# Patient Record
Sex: Male | Born: 1945 | Race: White | Hispanic: No | Smoking: Current every day smoker
Health system: Southern US, Community
[De-identification: ages and names within clinical notes are randomized; demographics above are authoritative.]

## PROBLEM LIST (undated history)

## (undated) DIAGNOSIS — F329 Major depressive disorder, single episode, unspecified: Secondary | ICD-10-CM

## (undated) DIAGNOSIS — E785 Hyperlipidemia, unspecified: Secondary | ICD-10-CM

## (undated) DIAGNOSIS — C801 Malignant (primary) neoplasm, unspecified: Secondary | ICD-10-CM

## (undated) DIAGNOSIS — Z789 Other specified health status: Secondary | ICD-10-CM

## (undated) DIAGNOSIS — I10 Essential (primary) hypertension: Secondary | ICD-10-CM

## (undated) DIAGNOSIS — F32A Depression, unspecified: Secondary | ICD-10-CM

## (undated) DIAGNOSIS — M1990 Unspecified osteoarthritis, unspecified site: Secondary | ICD-10-CM

## (undated) DIAGNOSIS — F419 Anxiety disorder, unspecified: Secondary | ICD-10-CM

## (undated) HISTORY — DX: Anxiety disorder, unspecified: F41.9

## (undated) HISTORY — DX: Hyperlipidemia, unspecified: E78.5

## (undated) HISTORY — PX: PROSTATE SURGERY: SHX751

## (undated) HISTORY — DX: Major depressive disorder, single episode, unspecified: F32.9

## (undated) HISTORY — DX: Depression, unspecified: F32.A

## (undated) HISTORY — DX: Essential (primary) hypertension: I10

## (undated) HISTORY — DX: Malignant (primary) neoplasm, unspecified: C80.1

## (undated) HISTORY — PX: FRACTURE SURGERY: SHX138

## (undated) NOTE — *Deleted (*Deleted)
PROGRESS NOTE    Lee Wang  LHT:[id number] DOB: March 16, 1945 DOA: 12/13/2019 PCP: Patient, No Pcp Per     Brief Narrative:  21 year old WM PMHx Etoh abuse, BPH, prostate cancer , tobacco abuse, HLD and HTN   who had a witnessed mechanical fall after missing a step on 11/19/2019 subsequently imaging studies demonstrated fracture dislocation of the ankle and acute fracture (avulsion) through the base of the fifth metatarsal, he underwent Closed reduction and splinting of bimalleolar right ankle fractureon 11/20/19, on 11/30/2019 patient underwent-Removal of pre-existing plate and screws from the fibula with revision ORIF of the fibula with a locking plate. ORIF of the vertical shear fracture of the medial malleolus -On 12/14/2019 patient was found to have failure of hardware with essentially conversion of his close fractures and open fracture, on 12/14/19 patient underwent Removal of hardware from right ankle,irrigation and debridement,unsuccessful attempt at placement of external fixation and splint application Transferred to [facility] from [facility] for definitive care .  Currently plan is treat hyponatremia.  Right tibiocalcaneal fusion tomorrow.   Subjective: 11/15 afebrile overnight A/O x2 (does not know when, why) afebrile overnight.  Cleared by surgery to be discharged to SNF   Assessment & Plan: Covid vaccination;   Principal Problem:   Closed right ankle fracture, sequela Active Problems:   Alcohol abuse   HTN (hypertension)   Depression with anxiety   Prostate CA (HCC)   BPH (benign prostatic hyperplasia)   Hyponatremia   Alcohol dependence ([place])   Closed right ankle fracture   Tobacco abuse   Rupture of operation wound   Postprocedural hypotension   Shock ([place])   Acute metabolic encephalopathy/ Hypoosmolar Hyponatremia.   -Multifactorial EtOH abuse, poor p.o. intake. -Monitor closely for further mental status changes  -Monitor closely  sodium level -Hold all medication which will lower sodium level. -Lexapro (hold) -NaCl tablets 2 g TID Lab Results  Component Value Date   NA 132 (L) 12/24/2019   NA 131 (L) 12/23/2019   NA 132 (L) 12/22/2019   NA 129 (L) 12/21/2019   NA 126 (L) 12/20/2019  -11/15 KVO normal saline  EtOH abuse -Folic acid 1 mg daily -Thiamine 100 mg daily -Seizure precaution  RIGHT ankle fracture with failed hardware. Presented with an open wound with failed hardware on 12/14/2019. Underwent removal of the hardware, irrigation and debridement as well as unsuccessful attempt at placement of external fixation on 12/14/2019. Patient was on IV antibiotics. Patient is currently transferred to [facility] for ankle fusion by Dr. Sharol Given tentatively scheduled for Wednesday, 12/20/2019 -.11/10  RIGHT TIBIOCALCANEAL FUSION -11/11 per orthopedic surgery STRICT nonweightbearing RIGHT lower extremity  Hypotension/Essential HTN -HTN currently not a factor -Per PACU staff patient receiving Phenyl ephedrine during surgery and unable to titrate patient off without dropping BP. -11/10 Albumin 12.5 g + normal saline -Continue Phenyl ephedrine, continue to attempt to wean patient off in the ICU.  Currently at 80 mcg/min -Map goal> 65 -11/11 Phenylephedrine 70 mcg/min -11/11 albumin 50 g + normal saline 67ml/hr -11/13 BP improved with fluids and midodrine -11/14 patient now hypertensive DC Midodrine  Acute blood loss anemia/acute on chronic iron deficiency anemia. -Patient has had multiple surgeries in the past 30 days secondary to fractured ankle  -11/11 transfuse 2 unit PRBC -11/11 ADDENDUM; possible blood transfusion reaction paged by [name] who states 1 hour post second unit of PRBC patient has red welts on his legs buttocks arms that are pruritic.  Negative airway compromise. -  Benadryl IV 12.5 mg x 1.  For possible blood transfusion reaction -11/12 transfuse 1 unit PRBC; pretreat Benadryl IV 12.5  mg x 1 Lab Results  Component Value Date   HGB 9.4 (L) 12/25/2019   HGB 9.0 (L) 12/24/2019   HGB 8.3 (L) 12/23/2019   HGB 8.2 (L) 12/22/2019   HGB 7.1 (L) 12/22/2019  -Transfuse for hemoglobin<7  Blood transfusion reaction? -On 11/11 post 2 units PRBC patient developed welts that are pruritic responded to IV Benadryl. -In the future pretreat patient with Benadryl prior to transfusion  Tobacco abuse. -Holding off on nicotine patch due to confusion.  Monitor.  Depression and anxiety. -No suicidal ideation. -No significant anxiety as well. -Lexapro 10 mg daily (hold).  Secondary to hyponatremia  Incidentally RSV positive. -Continue droplet precaution.  No respiratory symptoms for now.  Hypokalemia -Potassium goal> 4 -11/15 K-Dur 50 mEq  Hypophosphatemia -Phosphorus goal> 2.5   Hypomagnesmia -Magnesium goal> 2 -11/15 magnesium IV 2 g  ADDENDUM; 11/15 called by RN Rayfield Citizen who noticed elevated ST waves on monitor.  Patient asymptomatic however will obtain twelve-lead EKG, troponin, ensure all electrolytes at goal.   Goals of care -11/15 have requested to LCSW begin SNF search as patient has been cleared by orthopedic surgery     DVT prophylaxis: 11/13 anemia stable.  Start Heparin subcu   Code Status: Full Family Communication:  Status is: Inpatient    Dispo: The patient is from: Home              Anticipated d/c is to: SNF              Anticipated d/c date is:??              Patient currently Stable      Consultants:  Orthopedic surgery Dr. Lajoyce Corners   Procedures/Significant Events:  11/10  RIGHT TIBIOCALCANEAL FUSION    I have personally reviewed and interpreted all radiology studies and my findings are as above.  VENTILATOR SETTINGS: Room air 11/15 SPO2 100%    Cultures 11/3 SARS coronavirus negative 11/3 influenza A/B negative 11/3 positive RSV   Antimicrobials: Anti-infectives (From admission, onward)   Start     Ordered Stop    12/20/19 0815  ceFAZolin (ANCEF) IVPB 2g/100 mL premix        12/20/19 0722 12/20/19 1255   12/15/19 0900  cefTRIAXone (ROCEPHIN) 1 g in sodium chloride 0.9 % 100 mL IVPB        12/15/19 0803 12/18/19 0736   12/14/19 1400  ceFAZolin (ANCEF) IVPB 2g/100 mL premix        12/14/19 1158 12/15/19 2049   12/14/19 0600  ceFAZolin (ANCEF) IVPB 2g/100 mL premix        12/13/19 1316 12/14/19 0807   12/13/19 1415  ceFAZolin (ANCEF) IVPB 2g/100 mL premix        12/13/19 1315 12/14/19 1610       Devices    LINES / TUBES:      Continuous Infusions: . sodium chloride Stopped (12/23/19 0248)  . sodium chloride Stopped (12/21/19 1844)     Objective: Vitals:   12/24/19 2339 12/24/19 2340 12/25/19 0321 12/25/19 0743  BP:  135/77 (!) 151/75 (!) 146/69  Pulse:    65  Resp:  11 16 14   Temp: 97.6 F (36.4 C)  97.6 F (36.4 C) 97.6 F (36.4 C)  TempSrc: Oral  Oral Oral  SpO2:  100% 100% 99%  Weight:      Height:  Intake/Output Summary (Last 24 hours) at 12/25/2019 0809 Last data filed at 12/25/2019 0743 Gross per 24 hour  Intake 0 ml  Output 1760 ml  Net -1760 ml   Filed Weights   12/13/19 1302 12/17/19 1248 12/20/19 1106  Weight: 56.2 kg 64.3 kg 64.3 kg   Physical Exam:  General: A/O x2 (does not know when, why), sitting in chair comfortably, No acute respiratory distress Eyes: negative scleral hemorrhage, negative anisocoria, negative icterus ENT: Negative Runny nose, negative gingival bleeding, Neck:  Negative scars, masses, torticollis, lymphadenopathy, JVD Lungs: Clear to auscultation bilaterally without wheezes or crackles Cardiovascular: Sinus tachycardia without murmur gallop or rub normal S1 and S2 Abdomen: negative abdominal pain, nondistended, positive soft, bowel sounds, no rebound, no ascites, no appreciable mass Extremities: lower extremity with wound VAC in place, appears not to be draining any new fluid.  Fluid in reservoir appears to be old. Skin:  Negative rashes, lesions, ulcers Psychiatric:  Negative depression, negative anxiety, negative fatigue, negative mania  Central nervous system:  Cranial nerves II through XII intact, tongue/uvula midline, all extremities muscle strength 5/5, sensation intact throughout, negative dysarthria, negative expressive aphasia, negative receptive aphasia.  .     Data Reviewed: Care during the described time interval was provided by me .  I have reviewed this patient's available data, including medical history, events of note, physical examination, and all test results as part of my evaluation.  CBC: Recent Labs  Lab 12/20/19 0855 12/20/19 0855 12/21/19 0251 12/21/19 0251 12/21/19 1411 12/22/19 0309 12/22/19 1457 12/23/19 0625 12/24/19 0229  WBC 6.1   < > 24.6*  --  12.0* 10.5  --  6.8 6.6  NEUTROABS 4.0  --  21.5*  --   --  7.8*  --  5.3 4.6  HGB 8.2*   < > 6.6*   < > 7.7* 7.1* 8.2* 8.3* 9.0*  HCT 24.1*   < > 19.3*   < > 22.6* 21.1* 24.0* 24.6* 26.6*  MCV 96.0   < > 99.0  --  93.8 94.6  --  93.2 93.7  PLT 302   < > 351  --  266 220  --  230 243   < > = values in this interval not displayed.   Basic Metabolic Panel: Recent Labs  Lab 12/20/19 0855 12/21/19 0251 12/22/19 0309 12/23/19 0625 12/24/19 0229  NA 126* 129* 132* 131* 132*  K 4.1 5.1 3.7 3.5 3.5  CL 96* 99 105 102 99  CO2 25 19* 19* 22 27  GLUCOSE 88 94 95 92 98  BUN <5* 12 12 6* <5*  CREATININE 0.52* 0.94 0.68 0.59* 0.52*  CALCIUM 8.7* 8.4* 8.2* 8.2* 8.3*  MG 1.7 1.6* 1.9 1.6* 1.7  PHOS 3.4 4.9* 2.4* 2.4* 3.0   GFR: Estimated Creatinine Clearance: 73.7 mL/min (A) (by C-G formula based on SCr of 0.52 mg/dL (L)). Liver Function Tests: Recent Labs  Lab 12/20/19 0855 12/21/19 0251 12/22/19 0309 12/23/19 0625 12/24/19 0229  AST 31 47* 38 33 45*  ALT 20 27 23 20 24   ALKPHOS 132* 99 72 84 98  BILITOT 1.1 1.0 1.5* 1.2 1.3*  PROT 5.4* 4.7* 4.3* 4.3* 4.5*  ALBUMIN 2.5* 2.7* 2.7* 2.5* 2.6*   No results for  input(s): LIPASE, AMYLASE in the last 168 hours. No results for input(s): AMMONIA in the last 168 hours. Coagulation Profile: No results for input(s): INR, PROTIME in the last 168 hours. Cardiac Enzymes: No results for input(s): CKTOTAL, CKMB, CKMBINDEX, TROPONINI  in the last 168 hours. BNP (last 3 results) No results for input(s): PROBNP in the last 8760 hours. HbA1C: No results for input(s): HGBA1C in the last 72 hours. CBG: No results for input(s): GLUCAP in the last 168 hours. Lipid Profile: No results for input(s): CHOL, HDL, LDLCALC, TRIG, CHOLHDL, LDLDIRECT in the last 72 hours. Thyroid Function Tests: No results for input(s): TSH, T4TOTAL, FREET4, T3FREE, THYROIDAB in the last 72 hours. Anemia Panel: No results for input(s): VITAMINB12, FOLATE, FERRITIN, TIBC, IRON, RETICCTPCT in the last 72 hours. Sepsis Labs: Recent Labs  Lab 12/20/19 1820 12/21/19 0251  PROCALCITON <0.10 0.21    Recent Results (from the past 240 hour(s))  MRSA PCR Screening     Status: None   Collection Time: 12/20/19  8:13 PM   Specimen: Nasal Mucosa; Nasopharyngeal  Result Value Ref Range Status   MRSA by PCR NEGATIVE NEGATIVE Final    Comment:        The GeneXpert MRSA Assay (FDA approved for NASAL specimens only), is one component of a comprehensive MRSA colonization surveillance program. It is not intended to diagnose MRSA infection nor to guide or monitor treatment for MRSA infections. Performed at [facility], [address] [place], [place] [address]   Culture, blood (routine x 2)     Status: None (Preliminary result)   Collection Time: 12/20/19  9:23 PM   Specimen: BLOOD RIGHT HAND  Result Value Ref Range Status   Specimen Description BLOOD RIGHT HAND  Final   Special Requests   Final    BOTTLES DRAWN AEROBIC ONLY Blood Culture adequate volume   Culture   Final    NO GROWTH 4 DAYS Performed at [facility], [address] [place], [place] [address]    Report  Status PENDING  Incomplete  Culture, blood (routine x 2)     Status: None (Preliminary result)   Collection Time: 12/20/19  9:33 PM   Specimen: BLOOD LEFT HAND  Result Value Ref Range Status   Specimen Description BLOOD LEFT HAND  Final   Special Requests   Final    BOTTLES DRAWN AEROBIC ONLY Blood Culture results may not be optimal due to an inadequate volume of blood received in culture bottles   Culture   Final    NO GROWTH 4 DAYS Performed at [facility], [address] [place], [place] [address]    Report Status PENDING  Incomplete         Radiology Studies: No results found.      Scheduled Meds: . sodium chloride   Intravenous Once  . sodium chloride   Intravenous Once  . sodium chloride   Intravenous Once  . aspirin EC  81 mg Oral BID  . atorvastatin  40 mg Oral Daily  . Chlorhexidine Gluconate Cloth  6 each Topical Daily  . docusate sodium  100 mg Oral BID  . feeding supplement  237 mL Oral BID BM  . ferrous sulfate  325 mg Oral BID WC  . folic acid  1 mg Oral Daily  . heparin injection (subcutaneous)  5,000 Units Subcutaneous Q8H  . multivitamin with minerals  1 tablet Oral Daily  . sodium chloride  2 g Oral TID  . thiamine  100 mg Oral Daily   Or  . thiamine  100 mg Intravenous Daily  . vitamin B-12  1,000 mcg Oral Daily   Continuous Infusions: . sodium chloride Stopped (12/23/19 0248)  . sodium chloride  Stopped (12/21/19 1844)     LOS: 12 days    Time spent:40 min    WOODS, Roselind Messier, MD [facility] Pager [phone]  If 7PM-7AM, please contact night-coverage [email or web address] Password [email or web address] 12/25/2019, 8:09 AM

---

## 2003-01-18 ENCOUNTER — Other Ambulatory Visit: Admission: RE | Admit: 2003-01-18 | Discharge: 2003-01-18 | Payer: Self-pay | Admitting: Dermatology

## 2003-07-20 ENCOUNTER — Inpatient Hospital Stay (HOSPITAL_COMMUNITY): Admission: RE | Admit: 2003-07-20 | Discharge: 2003-07-26 | Payer: Self-pay | Admitting: Psychiatry

## 2003-07-21 ENCOUNTER — Ambulatory Visit (HOSPITAL_COMMUNITY): Admission: RE | Admit: 2003-07-21 | Discharge: 2003-07-21 | Payer: Self-pay | Admitting: Psychiatry

## 2009-02-09 HISTORY — PX: COLONOSCOPY: SHX174

## 2009-07-18 ENCOUNTER — Ambulatory Visit: Payer: Self-pay | Admitting: Internal Medicine

## 2009-07-18 ENCOUNTER — Encounter: Payer: Self-pay | Admitting: Gastroenterology

## 2009-07-18 DIAGNOSIS — R197 Diarrhea, unspecified: Secondary | ICD-10-CM | POA: Insufficient documentation

## 2009-07-18 DIAGNOSIS — F1010 Alcohol abuse, uncomplicated: Secondary | ICD-10-CM

## 2009-07-18 DIAGNOSIS — R109 Unspecified abdominal pain: Secondary | ICD-10-CM | POA: Insufficient documentation

## 2009-07-18 DIAGNOSIS — K921 Melena: Secondary | ICD-10-CM | POA: Insufficient documentation

## 2009-07-19 ENCOUNTER — Ambulatory Visit (HOSPITAL_COMMUNITY): Admission: RE | Admit: 2009-07-19 | Discharge: 2009-07-19 | Payer: Self-pay | Admitting: Internal Medicine

## 2009-07-22 LAB — CONVERTED CEMR LAB
AST: 21 units/L (ref 0–37)
Alkaline Phosphatase: 85 units/L (ref 39–117)
Basophils Absolute: 0.1 10*3/uL (ref 0.0–0.1)
CO2: 25 meq/L (ref 19–32)
Chloride: 101 meq/L (ref 96–112)
Creatinine, Ser: 1.06 mg/dL (ref 0.40–1.50)
Eosinophils Absolute: 0.6 10*3/uL (ref 0.0–0.7)
Eosinophils Relative: 6 % — ABNORMAL HIGH (ref 0–5)
Glucose, Bld: 91 mg/dL (ref 70–99)
HCT: 51.6 % (ref 39.0–52.0)
Hemoglobin: 17.5 g/dL — ABNORMAL HIGH (ref 13.0–17.0)
Indirect Bilirubin: 0.1 mg/dL (ref 0.0–0.9)
Lipase: 52 units/L (ref 0–75)
Monocytes Absolute: 0.9 10*3/uL (ref 0.1–1.0)
Neutrophils Relative %: 64 % (ref 43–77)
Prothrombin Time: 13.2 s (ref 11.6–15.2)
RBC: 5.25 M/uL (ref 4.22–5.81)
RDW: 14.2 % (ref 11.5–15.5)
Sodium: 138 meq/L (ref 135–145)
WBC: 10.1 10*3/uL (ref 4.0–10.5)

## 2009-08-13 ENCOUNTER — Telehealth (INDEPENDENT_AMBULATORY_CARE_PROVIDER_SITE_OTHER): Payer: Self-pay

## 2009-08-15 ENCOUNTER — Ambulatory Visit (HOSPITAL_COMMUNITY): Admission: RE | Admit: 2009-08-15 | Discharge: 2009-08-15 | Payer: Self-pay | Admitting: Internal Medicine

## 2009-08-15 ENCOUNTER — Ambulatory Visit: Payer: Self-pay | Admitting: Internal Medicine

## 2009-08-15 LAB — HM COLONOSCOPY

## 2009-08-18 ENCOUNTER — Encounter: Payer: Self-pay | Admitting: Internal Medicine

## 2009-08-22 ENCOUNTER — Encounter: Payer: Self-pay | Admitting: Internal Medicine

## 2010-03-11 NOTE — Letter (Signed)
Summary: Patient Notice, Colon Biopsy Results  [facility]  [address]   [place], [place] [address]   Phone: [phone]  Fax: [phone]       August 22, 2009   Lee Wang [address] [place], [place]  [address] [birth year]    Dear Lee Wang,  I am pleased to inform you that the biopsies taken during your recent colonoscopy did not show any evidence of cancer upon pathologic examination.  Additional information/recommendations:  You should have a repeat colonoscopy examination  in 3 years.  Please call us if you are having persistent problems or have questions about your condition that have not been fully answered at this time.  Sincerely,    R. Roetta Sessions MD, FACP [facility] Ph: [phone]    Fax: [phone]   Appended Document: Patient Notice, Colon Biopsy Results Notes faxed and reminder appt made-  Appended Document: Patient Notice, Colon Biopsy Results Letter mailed to pt.

## 2010-03-11 NOTE — Letter (Signed)
Summary: ABD U/S ORDER  ABD U/S ORDER   Imported By: Ave Filter 07/18/2009 13:24:48  _____________________________________________________________________  External Attachment:    Type:   Image     Comment:   External Document

## 2010-03-11 NOTE — Assessment & Plan Note (Signed)
Summary: rectal bleeding/ss   Visit Type:  Initial Visit Primary Care Provider:  [facility]  Chief Complaint:  rectal bleeding.  History of Present Illness: Lee Wang is a pleasant 65 y/o WM, who presents today as self-referral for further evaluation of rectal bleeding and lower abdominal pain. He has had problems for a "long-time". He c/o having 2-3 loose stools per day. He c/o anorectal irritation, takes Prep-H with relief. He c/o lower abd pain every day. Never goes away. Seems to be worse with alcohol consumption. Switched to Bud Light and seems to be better. He consumes 8-12 twelve ounce beers per day. He has drank regularly for past ten years, "since my wife left". "It helps my nerves.". He also takes either one valium or xanax daily for his nerves, which he gets from friends. He denies drug use, but smoked some pot three days ago, rarely does this however.   He denies heartburn, n/v, dysphagia. No prior TCS/EGD.   Current Medications (verified): 1)  Valium .... As Needed 2)  Xanax .... As Needed 3)  Effexor Xr 75 Mg Xr24h-Cap (Venlafaxine Hcl) .... Take 1 Tablet By Mouth Once A Day 4)  Propranolol Hcl 20 Mg Tabs (Propranolol Hcl) .... One By Mouth Bid 5)  Hydrochlorothiazide 25 Mg Tabs (Hydrochlorothiazide) .... Only Takes As Needed Extra Fluid  Allergies (verified): No Known Drug Allergies  Past History:  Past Medical History: Psych admission 2005, voluntary, for suicidal ideation Anxiety Disorder Depression Hyperlipidemia Hypertension  Past Surgical History: Bilateral ankle fractures  Family History: Mother, deceased, Alzheimers No FH of CRC, PUD, liver disease Father, enlarged heart, CAD, deceased age 1  Social History: Divorced several years ago after 30 yrs of marriage. Two children.  Patient currently smokes 1ppd and cigars. 8-12 beers per day (12 ounce cans). Rare marijuana use, last time three days ago.  Smoking Status:   current  Review of Systems General:  Denies fever, chills, sweats, anorexia, fatigue, weakness, and weight loss. Eyes:  Denies vision loss. ENT:  Denies nasal congestion, sore throat, hoarseness, and difficulty swallowing. CV:  Denies chest pains, angina, dyspnea on exertion, and peripheral edema. Resp:  Denies dyspnea at rest, dyspnea with exercise, cough, sputum, and wheezing. GI:  See HPI. GU:  Denies urinary burning and blood in urine. MS:  Denies joint pain / LOM. Derm:  Denies rash and itching. Neuro:  Denies weakness, frequent headaches, memory loss, and confusion. Psych:  Complains of depression and anxiety; denies suicidal ideation. Endo:  Denies unusual weight change. Heme:  Denies bruising and bleeding. Allergy:  Denies hives and rash.  Vital Signs:  Patient profile:   65 year old male Height:      67 inches Weight:      194 pounds BMI:     30.49 Temp:     99.3 degrees F oral Pulse rate:   80 / minute BP sitting:   148 / 90  (left arm) Cuff size:   regular  Vitals Entered By: Lee Spring LPN (July 18, 1608 10:52 AM)  Physical Exam  General:  Well developed, well nourished, no acute distress. Head:  Normocephalic and atraumatic. Eyes:  Conjunctivae pink, no scleral icterus.  Mouth:  Oropharyngeal mucosa moist, pink.  No lesions, erythema or exudate.   dentures:.   Neck:  Supple; no masses or thyromegaly. Lungs:  Clear throughout to auscultation. Heart:  Regular rate and rhythm; no murmurs, rubs,  or bruits. Abdomen:  Positive BS. Soft. Protruberant. Left liver  lobe easily palpable, ?enlarged. Did not feel spleen. No masses, hernias, bruits. Nontender. Rectal:  deferred until time of colonoscopy.   Extremities:  No clubbing, cyanosis, edema or deformities noted. Neurologic:  Alert and  oriented x4;  grossly normal neurologically. Skin:  Intact without significant lesions or rashes. Cervical Nodes:  No significant cervical adenopathy. Psych:  Alert and  cooperative. Normal mood and affect.  Impression & Recommendations:  Problem # 1:  HEMATOCHEZIA (ICD-578.1) Chronic hematochezia, chronic diarrhea in setting of alcohol abuse. DDx broad including IBD, portal colopathy, IBS with hemorrhoids, malignancy. Recommend colonoscopy. Due to alcohol abuse, anxiety, depression, polypharmacy he will need sedation in the OR. Colonoscopy to be performed in near future.  Risks, alternatives, and benefits including but not limited to the risk of reaction to medication, bleeding, infection, and perforation were addressed.  Patient voiced understanding and provided verbal consent.  Orders: T-CBC w/Diff ([id number]) New Patient Level IV (57846)  Problem # 2:  ABDOMINAL PAIN, UNSPECIFIED SITE (ICD-789.00) Etiology unclear. Pain primarily in lower abd. TCS as planned. Orders: T-CBC w/Diff [id number]) T-Lipase [id number]) T-Hepatic Function [phone]) T-Basic Metabolic Panel [id number]) New Patient Level IV (43329)  Problem # 3:  ? of HEPATOMEGALY (ICD-789.1) In setting of chronic alcohol abuse. Abd u/s. If any evidence of cirrhosis, he will need EGD at time of TCS.   Problem # 4:  ALCOHOL ABUSE (ICD-305.00) Discussed potential medical problems caused by alcohol abuse. He agrees with further w/u of liver. He did not voice any interest in alcohol cessation.  Orders: T-Lipase [id number]) T-Hepatic Function [id number]) T-PT (Prothrombin Time) ([id number]) New Patient Level IV (22025)  Appended Document: rectal bleeding/ss Abd u/s and labs do not suggest any evidence of cirrhosis. No need for EGD at this time. TCS as planned.

## 2010-03-11 NOTE — Progress Notes (Signed)
Summary: pt did not show today for Pre-op  Phone Note Other Incoming   Caller: KIM @ END[facility] Summary of Call: Selena Batten called and said pt did not show for Pre-Op today, is scheduled for Procedure Thurs, 08/15/2009. I called and home number rang many times, no answer. I called work number, not a working number. Initial call taken by: Cloria Spring LPN,  August 13, 452 4:35 PM     Appended Document: pt did not show today for Pre-op Spoke with pt this AM. He was not aware that he had a Pre-op appt. He will go today, per Selena Batten, @ 11:00 AM.

## 2010-03-11 NOTE — Letter (Signed)
Summary: TCS POSS EGD ORDER  TCS POSS EGD ORDER   Imported By: Ave Filter 07/18/2009 13:25:28  _____________________________________________________________________  External Attachment:    Type:   Image     Comment:   External Document

## 2010-03-11 NOTE — Letter (Signed)
Summary: Patient Notice, Colon Biopsy Results  [facility]  [address]   [place], [place] [address]   Phone: [phone]  Fax: [phone]       August 18, 2009   UNDRAY ALLMAN [address] [place], [place]  [address] 1945/10/13    Dear Mr. Peden,  I am pleased to inform you that the biopsies taken during your recent colonoscopy did not show any evidence of cancer upon pathologic examination.  Additional information/recommendations:  You should have a repeat colonoscopy examination  in 3 years.  Please call us if you are having persistent problems or have questions about your condition that have not been fully answered at this time.  Sincerely,    R. Roetta Sessions MD, FACP [facility] Ph: [phone]    Fax: [phone]   Appended Document: Patient Notice, Colon Biopsy Results Letter mailed to pt.  Appended Document: Patient Notice, Colon Biopsy Results reminder appt made - cdg

## 2010-05-12 ENCOUNTER — Ambulatory Visit: Payer: BC Managed Care – PPO | Attending: Radiation Oncology | Admitting: Radiation Oncology

## 2010-05-12 DIAGNOSIS — C61 Malignant neoplasm of prostate: Secondary | ICD-10-CM | POA: Insufficient documentation

## 2010-05-28 ENCOUNTER — Encounter: Payer: Self-pay | Admitting: Nurse Practitioner

## 2010-05-28 DIAGNOSIS — E785 Hyperlipidemia, unspecified: Secondary | ICD-10-CM

## 2010-05-28 DIAGNOSIS — F418 Other specified anxiety disorders: Secondary | ICD-10-CM | POA: Insufficient documentation

## 2010-05-28 DIAGNOSIS — I10 Essential (primary) hypertension: Secondary | ICD-10-CM | POA: Insufficient documentation

## 2010-05-28 DIAGNOSIS — N400 Enlarged prostate without lower urinary tract symptoms: Secondary | ICD-10-CM | POA: Insufficient documentation

## 2010-05-28 DIAGNOSIS — C61 Malignant neoplasm of prostate: Secondary | ICD-10-CM

## 2010-06-27 NOTE — H&P (Signed)
NAME:  Lee Wang, Lee Wang                         ACCOUNT NO.:  [id number]   MEDICAL RECORD NO.:  [id number]                   PATIENT TYPE:  IPS   LOCATION:  0504                                 FACILITY:  [facility]   PHYSICIAN:  Jeanice Lim, M.D.              DATE OF BIRTH:  [birth year]   DATE OF ADMISSION:  07/20/2003  DATE OF DISCHARGE:                         PSYCHIATRIC ADMISSION ASSESSMENT   IDENTIFYING INFORMATION:  This is a voluntary admission.  This is an almost  65 year old divorced white male.  Apparently this gentleman presented to his  private physician, Dr. Christell Constant, who referred him to the [facility].  The patient had reported suicidal ideation with no distinct  plan.  He was also suffering from decreased sleep, poor appetite and feels  unable to work.  His family was worried about his suicidal ideation.  He is  not sleeping well.  He stated that he stopped Prozac because of migraines.  He feels sad.  He wants something to calm him down.  He is complaining of  frequent headaches.   This gentleman has only completed the 7th grade.  He is nervous.  He is  concerned about being in a psychiatric hospital, although he is trying to be  cooperative.  Apparently in calling his pharmacy, [facility] in [place], he was  last prescribed Prozac and Lipitor back in November of 2004.  He was  performed some Xanax last August of 2004, and on June 7 he did get diazepam  5 mg, #30.  The patient is not sure when he stopped taking Prozac but he  stated it was giving him headaches so he stopped.  Recently he describes  frequent headaches, beginning he thinks about 6 weeks ago.  He stated that  one was so bad that he momentarily lost his vision.  He also reports head  trauma about 20 years ago and interestingly enough he did have a CT scan  right here at [facility] at that time.  Since the headaches are  new, they are debilitating, we will go ahead and get a CT scan.   PAST  PSYCHIATRIC HISTORY:  He has no inpatient treatment.  He has received  antidepressants in the past from his primary care Jaelah Hauth, however he has  not taken them.   SOCIAL HISTORY:  He completed the 7th grade.  He was married for 32 years.  He was divorced about 4 years ago.  His wife left him.  He is quite bitter  about this.  He calls her a tramp, a slut, etc. and claims that she ran  around on him.  He works in a [employer].  He has a son age 65 and a  daughter 65.   FAMILY HISTORY:  He states he has a cousin who had depression.  He states  his mother took Navane and apparently ultimately had to be in a nursing  home.   ALCOHOL AND DRUG ABUSE:  He does acknowledge smoking 1 to 1-1/2 packs of  cigarettes a day.  He has smoked on and off since the age of 73.  He states  he has quit for as long as 3 years at a time.   PAST MEDICAL HISTORY:  His primary care Deronte Solis is Dr. Christell Constant.  He has been  known to have an elevated blood pressure and elevated lipids in the past.  He has been prescribed Lipitor.  I did not get any history for any anti-  hypertensive medication from the pharmacy, and he is currently not taking  any of these.  His current [facility] is [facility] [place].   ALLERGIES:  No known drug allergies.   POSITIVE PHYSICAL FINDINGS:  There were no abnormal physical findings.  He  is a well-nourished, well-developed white male who is in no acute distress.  HEENT:  Within normal limits.  LUNGS:  Clear.  HEART:  Regular rate and rhythm without murmurs, rubs or gallops.  ABDOMEN:  Soft, with no palpable mass, megaly or tenderness.  He has no  organomegaly.  MUSCULOSKELETAL SYSTEM:  Reveals no clubbing, cyanosis, or edema.  NEUROLOGICALLY:  Cranial nerves II-XII are grossly intact.   MENTAL STATUS EXAM:  He is alert.  He is well groomed and dressed.  His  speech is a little bit slurred but I think this is because of where he is  from in [place].  His mood is depressed,  his affect is congruent.  His thought processes show that his concentration and memory are good, his  judgment and insight are fair, his intelligence is average to below.   ADMISSION DIAGNOSES:   AXIS I:  1. Depression with suicidal ideation.  2. Anxiety.   AXIS II:  Deferred.   AXIS III:  None known.  History for remote head trauma to right side of the  head 20 years ago, now new onset headaches.   AXIS IV:  Stress at work.   AXIS V:  Global assessment of function is 30.   PLAN:  We will admit for stabilization and initiation of medications.  We  will rule out an intracranial etiology for new onset of headache.  He will  be given Fioricet to help with his headache, Lexapro to help with his  depression and anxiety, and we will obtain a CT scan.     Vic Ripper, P.A.-C.               Jeanice Lim, M.D.    MD/MEDQ  D:  07/21/2003  T:  07/21/2003  Job:  [id number]

## 2010-06-27 NOTE — Discharge Summary (Signed)
NAME:  Lee Wang, Lee Wang NO.:  [id number]   MEDICAL RECORD NO.:  [id number]                   PATIENT TYPE:  IPS   LOCATION:  0504                                 FACILITY:  [facility]   PHYSICIAN:  Geoffery Lyons, M.D.                   DATE OF BIRTH:  10-Mar-1945   DATE OF ADMISSION:  07/20/2003  DATE OF DISCHARGE:  07/26/2003                                 DISCHARGE SUMMARY   CHIEF COMPLAINT AND PRESENT ILLNESS:  This was the first admission to [facility] for this 65 year old divorced white male who  presented to his primary physician who referred him to the [facility]  Emergency Department.  He reported suicidal ideas, no plan.  He suffered  from decreased sleep, poor appetite, unable to work.  Family was worried  about his suicidal ideas, his not sleeping.  He had stopped Prozac because  of migraines.  He was feeling sad, wanted something to calm him down,  frequent headaches.   PAST PSYCHIATRIC HISTORY:  No inpatient treatment.  Antidepressants from his  primary care Ewald Beg.  Head trauma about 20 years ago.   SUBSTANCE ABUSE HISTORY:  He denied the abuse of any substances.   PAST MEDICAL HISTORY:  1. High blood pressure.  2. Elevated lipids; had been on Lipitor.   PHYSICAL EXAMINATION:  Physical examination was performed, failed to show  any acute findings.   LABORATORY DATA:  CBC was within normal limits.  Blood chemistries were  within normal limits.  Lipid profile: Cholesterol 216, triglycerides 334.  TSH 1.026.   MENTAL STATUS EXAM:  Mental status exam upon admission revealed an alert,  cooperative male, well groomed and dressed.  Speech was slurred.  Mood was  depressed.  Affect was congruent.  There was no evidence of delusions, no  suicidal ideas, no homicidal ideas, endorsed no hallucinations.  Cognitive:  Cognition was well preserved.   ADMISSION DIAGNOSES:   AXIS I:  1. Major depression.  2. Anxiety disorder,  not otherwise specified.   AXIS II:  No diagnosis.   AXIS III:  1. Head trauma 20 years prior to this admission.  2. Hypercholesterolemia.  3. Arterial hypertension.   AXIS IV:  Moderate.   AXIS V:  Global assessment of functioning upon admission 25-30, highest  global assessment of functioning in the last year 55-60.   HOSPITAL COURSE:  He was admitted and started in intensive individual and  group psychotherapy.  He was given Ambien for sleep.  He was given some  Librium as needed.  He was given Fioricet for his headache.  He was started  on Lexapro 10 mg per day and Ambien 10 mg at bedtime for sleep.  He was  given Midrin when the Fioricet was not helpful.  Lexapro was eventually  increased to 50 mg per day.  As the hospital progressed he seemed to  start  doing better.  He was less anxious, less depressed, endorsing improvement in  sleep.  He minimized his suicidal ideation.  The headache he felt was  affecting his mood.  By June 15, he was starting to feel better, started  sleeping through the night.  There was a family session that went well.  By  June 16, he was in full contact with reality.  There were no suicidal ideas,  no homicidal ideas, no hallucinations, no delusions, endorsing that he was  feeling much better, his affect was bright and broad, so we went ahead and  discharged to outpatient followup.   DISCHARGE DIAGNOSES:   AXIS I:  1. Major depression.  2. Anxiety disorder, not otherwise specified.   AXIS II:  No diagnosis.   AXIS III:  1. Hyperlipidemia.  2. Arterial hypertension.   AXIS IV:  Moderate.   AXIS V:  Global assessment of functioning upon discharge 55-60.   DISCHARGE MEDICATIONS:  1. Lexapro 50 mg per day.  2. Fioricet one to two every four to six hours as needed for headache.  3. Midrin ________ headaches, one every one hour while headache lasts, up to     six in 24 hours.   FOLLOW UP:  He was to follow up with Milagros Evener, M.D., and  his primary  physician.                                               Geoffery Lyons, M.D.    IL/MEDQ  D:  08/14/2003  T:  08/15/2003  Job:  [id number]

## 2010-08-14 ENCOUNTER — Ambulatory Visit
Admission: RE | Admit: 2010-08-14 | Discharge: 2010-08-14 | Disposition: A | Discharge status: Home / Self Care | Payer: Medicare Other | Source: Ambulatory Visit | Attending: Urology | Admitting: Urology

## 2010-08-14 ENCOUNTER — Other Ambulatory Visit: Payer: Self-pay | Admitting: Urology

## 2010-08-14 ENCOUNTER — Ambulatory Visit (HOSPITAL_BASED_OUTPATIENT_CLINIC_OR_DEPARTMENT_OTHER)
Admission: RE | Admit: 2010-08-14 | Discharge: 2010-08-14 | Disposition: A | Discharge status: Home / Self Care | Payer: Medicare Other | Source: Ambulatory Visit | Attending: Urology | Admitting: Urology

## 2010-08-14 DIAGNOSIS — Z01811 Encounter for preprocedural respiratory examination: Secondary | ICD-10-CM

## 2010-08-14 DIAGNOSIS — Z538 Procedure and treatment not carried out for other reasons: Secondary | ICD-10-CM | POA: Insufficient documentation

## 2010-09-11 LAB — COMPREHENSIVE METABOLIC PANEL
Albumin: 3.9 g/dL (ref 3.5–5.2)
CO2: 30 mEq/L (ref 19–32)
Calcium: 10.2 mg/dL (ref 8.4–10.5)
Chloride: 101 mEq/L (ref 96–112)
Glucose, Bld: 86 mg/dL (ref 70–99)
Potassium: 4.8 mEq/L (ref 3.5–5.1)
Total Protein: 6.6 g/dL (ref 6.0–8.3)

## 2010-09-11 LAB — CBC
MCH: 32.4 pg (ref 26.0–34.0)
MCHC: 31.9 g/dL (ref 30.0–36.0)
MCV: 101.6 fL — ABNORMAL HIGH (ref 78.0–100.0)
Platelets: 266 10*3/uL (ref 150–400)
RBC: 4.32 MIL/uL (ref 4.22–5.81)
WBC: 8.8 10*3/uL (ref 4.0–10.5)

## 2010-09-11 LAB — PROTIME-INR: Prothrombin Time: 13.4 seconds (ref 11.6–15.2)

## 2010-09-18 ENCOUNTER — Ambulatory Visit: Payer: BLUE CROSS/BLUE SHIELD | Admitting: Radiation Oncology

## 2010-09-18 ENCOUNTER — Ambulatory Visit (HOSPITAL_COMMUNITY): Payer: Medicare Other

## 2010-09-18 ENCOUNTER — Ambulatory Visit (HOSPITAL_BASED_OUTPATIENT_CLINIC_OR_DEPARTMENT_OTHER)
Admission: RE | Admit: 2010-09-18 | Discharge: 2010-09-18 | Disposition: A | Discharge status: Home / Self Care | Payer: Medicare Other | Source: Ambulatory Visit | Attending: Urology | Admitting: Urology

## 2010-09-18 DIAGNOSIS — Z01812 Encounter for preprocedural laboratory examination: Secondary | ICD-10-CM | POA: Insufficient documentation

## 2010-09-18 DIAGNOSIS — C61 Malignant neoplasm of prostate: Secondary | ICD-10-CM | POA: Insufficient documentation

## 2010-09-18 DIAGNOSIS — I10 Essential (primary) hypertension: Secondary | ICD-10-CM | POA: Insufficient documentation

## 2010-09-18 DIAGNOSIS — Z79899 Other long term (current) drug therapy: Secondary | ICD-10-CM | POA: Insufficient documentation

## 2010-09-19 NOTE — Op Note (Signed)
NAME:  Lee Wang, Lee Wang NO.:  [id number]  MEDICAL RECORD NO.:  [id number]  LOCATION:                                 FACILITY:  PHYSICIAN:  Excell Seltzer. Annabell Howells, M.D.    DATE OF BIRTH:  November 24, 1945  DATE OF PROCEDURE:  09/18/2010 DATE OF DISCHARGE:                              OPERATIVE REPORT   PROCEDURES:  Prostate seed implant and cystoscopy.  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  SURGEON:  Excell Seltzer. Annabell Howells, M.D.  RADIATION ONCOLOGIST:  Oneita Hurt, M.D.  ANESTHESIA:  General.  DRAIN:  16-French Foley catheter.  ESTIMATED BLOOD LOSS:  Minimal.  COMPLICATIONS:  None.  INDICATIONS:  Jermiah is a 65 year old white male with Gleason 6 adenocarcinoma of the prostate who has elected to undergo seed implantation.  FINDINGS OF PROCEDURE:  He was given Cipro.  He was taken to operating room where a general anesthetic was induced.  He was fitted with PAS hose and placed in lithotomy position.  His genitalia was prepped with Betadine solution and a 16-French Foley catheter was inserted.  Balloon was filled with dilute contrast and placed to drainage.  His perineum was clipped.  A red-rubber rectal catheter was placed and secured.  The ultrasound probe was assembled and inserted and secured to the fixed base.  Once in good position, the perineum was prepped with Betadine solution and the sterile biopsy grid was placed on the ultrasound apparatus.  Stabilization needles were then placed.  At this point, Dr. Kathrynn Running __________ using the Nucletron device, performed contour modelling of the prostate followed by the implant planning.  Once the planning had been completed, I returned to the room and placed the needles according to the preplaced plan with the seeds deployed using the Nucletron device.  A total of 23 needles were used with 88 seeds of 125 with an  activity of 44.264 mCi per seed.  Once all the seeds were in place, the ultrasound was removed and fluoroscopy was used to image the prostate, both with and without the Foley in place.  At this point, the Foley catheter was removed.  The genitalia was re- prepped and cystoscopy was performed.  This revealed a normal urethra. There was some clot in the proximal urethra and prostatic urethra.  The external sphincter was intact.  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  Examination of bladder revealed moderate trabeculation.  No tumors or stones or seeds were seen.  Ureteral orifices were unremarkable.  The cystoscope was removed and a 16-French Foley catheter was inserted.  The balloon was filled with sterile fluid, and the catheter was placed to straight drainage.  The perineum was cleaned and a dressing was applied after removal of the red rubber catheter.  The patient was then taken down from lithotomy position.  His anesthetic was reversed, and he was moved to recovery room in a stable condition.  There were no complications.     Excell Seltzer. Annabell Howells, M.D.     JJW/MEDQ  D:  09/18/2010  T:  09/18/2010  Job:  [id number]  cc:  Oneita Hurt, M.D. Fax: [phone]  Bertram Millard. Dahlstedt, M.D. Fax: [phone]  [facility]  Electronically Signed by Bjorn Pippin M.D. on 09/19/2010 12:53:22 PM

## 2010-10-09 ENCOUNTER — Ambulatory Visit
Admission: RE | Admit: 2010-10-09 | Discharge: 2010-10-09 | Disposition: A | Discharge status: Home / Self Care | Payer: Medicare Other | Source: Ambulatory Visit | Attending: Radiation Oncology | Admitting: Radiation Oncology

## 2010-10-09 DIAGNOSIS — C61 Malignant neoplasm of prostate: Secondary | ICD-10-CM

## 2010-12-21 ENCOUNTER — Encounter: Payer: Self-pay | Admitting: Radiation Oncology

## 2010-12-21 NOTE — Progress Notes (Signed)
[facility] Radiation Oncology 3-D Planning Note Prostate Brachytherapy  Name: PHYLLIS WHITEFIELD MRN: [id number] DOB: 06/27/1945  CC:  Bjorn Pippin, MD  Diagnosis: 65 year old gentleman with stage T1c. adenocarcinoma of the prostate with a Gleason's score of 3+3 and PSA of 9.8  Narrative: Luiz Blare returned following prostate seed implantation for post implant planning. He underwent CT scan to delineate the three-dimensional structures of the pelvis and demonstrate the radiation distribution.  Results:   Prostate Coverage - The dose of radiation delivered to the 90% or more of the prostate gland (D90) was 123.87% of the prescription dose. This exceeds our goal of greater than 90%. Rectal Sparing - The volume of rectal tissue receiving the prescription dose or higher was 0.55 cc. This falls under our thresholds tolerance of 1.0 cc.  Impression: Lucero Ide Bonello's prostate seed implant appears to show adequate target coverage and appropriate rectal sparing.  Plan:  The patient will continue to follow with urology for ongoing PSA determinations. I would anticipate a high likelihood for local tumor control with minimal risk for rectal morbidity.  ---------------------------------- Artist Pais. Kathrynn Running, M.D. Radiation Oncology [phone]

## 2011-05-08 ENCOUNTER — Encounter: Payer: Self-pay | Admitting: *Deleted

## 2012-06-22 ENCOUNTER — Other Ambulatory Visit: Payer: Self-pay

## 2012-06-22 MED ORDER — PROPRANOLOL HCL 20 MG PO TABS: 20.0000 mg | ORAL_TABLET | Freq: Three times a day (TID) | ORAL | Status: DC

## 2012-07-20 ENCOUNTER — Other Ambulatory Visit: Payer: Self-pay | Admitting: Urology

## 2012-07-20 DIAGNOSIS — N644 Mastodynia: Secondary | ICD-10-CM

## 2012-08-08 ENCOUNTER — Other Ambulatory Visit: Payer: Self-pay

## 2012-08-08 MED ORDER — ATORVASTATIN CALCIUM 40 MG PO TABS: 40.0000 mg | ORAL_TABLET | Freq: Every day | ORAL | Status: DC

## 2012-08-16 ENCOUNTER — Ambulatory Visit
Admission: RE | Admit: 2012-08-16 | Discharge: 2012-08-16 | Disposition: A | Discharge status: Home / Self Care | Payer: Medicare Other | Source: Ambulatory Visit | Attending: Urology | Admitting: Urology

## 2012-08-16 DIAGNOSIS — N644 Mastodynia: Secondary | ICD-10-CM

## 2012-10-31 ENCOUNTER — Encounter: Payer: Self-pay | Admitting: Family Medicine

## 2012-10-31 ENCOUNTER — Ambulatory Visit (INDEPENDENT_AMBULATORY_CARE_PROVIDER_SITE_OTHER): Payer: Medicare Other | Admitting: Family Medicine

## 2012-10-31 VITALS — BP 105/66 | HR 79 | Temp 98.4°F | Ht 66.0 in | Wt 183.4 lb

## 2012-10-31 DIAGNOSIS — E785 Hyperlipidemia, unspecified: Secondary | ICD-10-CM

## 2012-10-31 DIAGNOSIS — R5381 Other malaise: Secondary | ICD-10-CM

## 2012-10-31 DIAGNOSIS — F329 Major depressive disorder, single episode, unspecified: Secondary | ICD-10-CM

## 2012-10-31 DIAGNOSIS — I10 Essential (primary) hypertension: Secondary | ICD-10-CM

## 2012-10-31 DIAGNOSIS — R5383 Other fatigue: Secondary | ICD-10-CM

## 2012-10-31 LAB — POCT CBC
Granulocyte percent: 72.6 %G (ref 37–80)
HCT, POC: 40.8 % — AB (ref 43.5–53.7)
Hemoglobin: 14.1 g/dL (ref 14.1–18.1)
Lymph, poc: 1.7 (ref 0.6–3.4)
MCH, POC: 33.6 pg — AB (ref 27–31.2)
MCHC: 34.6 g/dL (ref 31.8–35.4)
MCV: 97 fL (ref 80–97)
MPV: 7.6 fL (ref 0–99.8)
POC Granulocyte: 6.2 (ref 2–6.9)
POC LYMPH PERCENT: 20.1 %L (ref 10–50)
Platelet Count, POC: 222 10*3/uL (ref 142–424)
RBC: 4.2 M/uL — AB (ref 4.69–6.13)
RDW, POC: 14.1 %
WBC: 8.6 10*3/uL (ref 4.6–10.2)

## 2012-10-31 MED ORDER — VENLAFAXINE HCL ER 150 MG PO CP24: 150.0000 mg | ORAL_CAPSULE | Freq: Every day | ORAL | Status: DC

## 2012-10-31 MED ORDER — ALPRAZOLAM 0.5 MG PO TABS: ORAL_TABLET | ORAL | Status: DC

## 2012-10-31 NOTE — Patient Instructions (Signed)

## 2012-10-31 NOTE — Progress Notes (Signed)
  Subjective:    Patient ID: Lee Wang, male    DOB: 1945/05/05, 67 y.o.   MRN: [id number]  HPI This 67 y.o. male presents for evaluation of depression.  He needs refill on his xanax and  His effexor xr.  He is doing fine otherwise.  He has hx of hypertension and hyperlipidemia.   Review of Systems No chest pain, SOB, HA, dizziness, vision change, N/V, diarrhea, constipation, dysuria, urinary urgency or frequency, myalgias, arthralgias or rash.     Objective:   Physical Exam Vital signs noted  Well developed well nourished male.  HEENT - Head atraumatic Normocephalic                Eyes - PERRLA, Conjuctiva - clear Sclera- Clear EOMI                Ears - EAC's Wnl TM's Wnl Gross Hearing WNL                Nose - Nares patent                 Throat - oropharanx wnl Respiratory - Lungs CTA bilateral Cardiac - RRR S1 and S2 without murmur GI - Abdomen soft Nontender and bowel sounds active x 4 Extremities - No edema. Neuro - Grossly intact.       Assessment & Plan:  Fatigue - Plan: POCT CBC, CMP14+EGFR, Vit D  25 hydroxy (rtn osteoporosis monitoring)  Hyperlipemia - Plan: Lipid panel  Hypertension - Plan: POCT CBC, CMP14+EGFR  Depression -  Refill xanax and effexor.  Follow up in 3 months  Deatra Canter FNP

## 2012-11-01 ENCOUNTER — Other Ambulatory Visit: Payer: Self-pay | Admitting: Family Medicine

## 2012-11-01 DIAGNOSIS — E559 Vitamin D deficiency, unspecified: Secondary | ICD-10-CM

## 2012-11-01 LAB — CMP14+EGFR
ALT: 39 IU/L (ref 0–44)
AST: 39 IU/L (ref 0–40)
Albumin/Globulin Ratio: 1.9 (ref 1.1–2.5)
Albumin: 4 g/dL (ref 3.6–4.8)
Alkaline Phosphatase: 112 IU/L (ref 39–117)
BUN/Creatinine Ratio: 13 (ref 10–22)
BUN: 11 mg/dL (ref 8–27)
CO2: 23 mmol/L (ref 18–29)
Calcium: 8.6 mg/dL (ref 8.6–10.2)
Chloride: 98 mmol/L (ref 97–108)
Creatinine, Ser: 0.82 mg/dL (ref 0.76–1.27)
GFR calc Af Amer: 106 mL/min/{1.73_m2} (ref >59)
GFR calc non Af Amer: 92 mL/min/{1.73_m2} (ref >59)
Globulin, Total: 2.1 g/dL (ref 1.5–4.5)
Glucose: 79 mg/dL (ref 65–99)
Potassium: 4 mmol/L (ref 3.5–5.2)
Sodium: 139 mmol/L (ref 134–144)
Total Bilirubin: 0.5 mg/dL (ref 0.0–1.2)
Total Protein: 6.1 g/dL (ref 6.0–8.5)

## 2012-11-01 LAB — LIPID PANEL
Chol/HDL Ratio: 3.4 ratio units (ref 0.0–5.0)
Cholesterol, Total: 118 mg/dL (ref 100–199)
HDL: 35 mg/dL — ABNORMAL LOW (ref >39)
LDL Calculated: 54 mg/dL (ref 0–99)
Triglycerides: 143 mg/dL (ref 0–149)
VLDL Cholesterol Cal: 29 mg/dL (ref 5–40)

## 2012-11-01 LAB — VITAMIN D 25 HYDROXY (VIT D DEFICIENCY, FRACTURES): Vit D, 25-Hydroxy: 20.9 ng/mL — ABNORMAL LOW (ref 30.0–100.0)

## 2012-11-01 MED ORDER — VITAMIN D (ERGOCALCIFEROL) 1.25 MG (50000 UNIT) PO CAPS: 50000.0000 [IU] | ORAL_CAPSULE | ORAL | Status: DC

## 2012-12-15 ENCOUNTER — Other Ambulatory Visit: Payer: Self-pay

## 2013-03-16 ENCOUNTER — Other Ambulatory Visit: Payer: Self-pay | Admitting: Family Medicine

## 2013-03-20 ENCOUNTER — Other Ambulatory Visit: Payer: Self-pay

## 2013-03-20 MED ORDER — PROPRANOLOL HCL 20 MG PO TABS: 20.0000 mg | ORAL_TABLET | Freq: Three times a day (TID) | ORAL | Status: DC

## 2013-03-20 NOTE — Telephone Encounter (Signed)
Last seen 10/31/12  B [place] If approved route to nurse to call into [facility]

## 2013-03-28 ENCOUNTER — Other Ambulatory Visit: Payer: Self-pay | Admitting: Family Medicine

## 2013-03-30 ENCOUNTER — Encounter: Payer: Self-pay | Admitting: Family Medicine

## 2013-03-30 ENCOUNTER — Ambulatory Visit (INDEPENDENT_AMBULATORY_CARE_PROVIDER_SITE_OTHER): Payer: Medicare Other | Admitting: Family Medicine

## 2013-03-30 VITALS — BP 107/61 | HR 65 | Temp 98.5°F | Ht 66.0 in | Wt 183.0 lb

## 2013-03-30 DIAGNOSIS — E785 Hyperlipidemia, unspecified: Secondary | ICD-10-CM

## 2013-03-30 DIAGNOSIS — I10 Essential (primary) hypertension: Secondary | ICD-10-CM

## 2013-03-30 DIAGNOSIS — Z0000 Encounter for general adult medical examination without abnormal findings: Secondary | ICD-10-CM

## 2013-03-30 LAB — POCT CBC
Granulocyte percent: 63.9 %G (ref 37–80)
HCT, POC: 44.7 % (ref 43.5–53.7)
Hemoglobin: 13.9 g/dL — AB (ref 14.1–18.1)
Lymph, poc: 3.1 (ref 0.6–3.4)
MCH, POC: 31.2 pg (ref 27–31.2)
MCHC: 31.1 g/dL — AB (ref 31.8–35.4)
MCV: 100.2 fL — AB (ref 80–97)
MPV: 8.2 fL (ref 0–99.8)
POC Granulocyte: 7.1 — AB (ref 2–6.9)
POC LYMPH PERCENT: 28.1 %L (ref 10–50)
Platelet Count, POC: 246 10*3/uL (ref 142–424)
RBC: 4.5 M/uL — AB (ref 4.69–6.13)
RDW, POC: 14.4 %
WBC: 11.1 10*3/uL — AB (ref 4.6–10.2)

## 2013-03-30 MED ORDER — ALPRAZOLAM 0.5 MG PO TABS: 0.5000 mg | ORAL_TABLET | Freq: Two times a day (BID) | ORAL | Status: DC | PRN

## 2013-03-30 MED ORDER — PROPRANOLOL HCL 20 MG PO TABS: 20.0000 mg | ORAL_TABLET | Freq: Three times a day (TID) | ORAL | Status: DC

## 2013-03-30 MED ORDER — VENLAFAXINE HCL ER 150 MG PO CP24: 150.0000 mg | ORAL_CAPSULE | Freq: Every day | ORAL | Status: DC

## 2013-03-30 MED ORDER — FENOFIBRATE 145 MG PO TABS: 145.0000 mg | ORAL_TABLET | Freq: Every day | ORAL | Status: DC

## 2013-03-30 MED ORDER — ATORVASTATIN CALCIUM 40 MG PO TABS: 40.0000 mg | ORAL_TABLET | Freq: Every day | ORAL | Status: DC

## 2013-03-30 NOTE — Progress Notes (Signed)
   Subjective:    Patient ID: Lee Wang, male    DOB: 1945/05/10, 68 y.o.   MRN: [id number]  HPI  This 68 y.o. male presents for evaluation of needing refills and needing annual exam. He has hx of cigarette smoking and he is trying to cut back.  He has hx of BPH and states he has  Been tx for prostate cancer with seeds and radiation.  He no longer has bph sx's.  Review of Systems    No chest pain, SOB, HA, dizziness, vision change, N/V, diarrhea, constipation, dysuria, urinary urgency or frequency, myalgias, arthralgias or rash.  Objective:   Physical Exam Vital signs noted  Well developed well nourished male.  HEENT - Head atraumatic Normocephalic                Eyes - PERRLA, Conjuctiva - clear Sclera- Clear EOMI                Ears - EAC's Wnl TM's Wnl Gross Hearing WNL                Nose - Nares patent                 Throat - oropharanx wnl Respiratory - Lungs CTA bilateral Cardiac - RRR S1 and S2 without murmur GI - Abdomen soft Nontender and bowel sounds active x 4 Extremities - No edema. Neuro - Grossly intact.       Assessment & Plan:  Routine general medical examination at a health care facility - Plan: POCT CBC, Lipid panel, CMP14+EGFR, PSA, total and free, TSH.  Prostate Cancer - Check PSA.  He has been tx'd with radiation.  Tobacco abuse - Discussed DC smoking.  HTN - Controlled and continue current regimen  Hyperlipidemia - Continue with atorvastatin and tricor  Lysbeth Penner FNP

## 2013-03-31 LAB — CMP14+EGFR
ALT: 31 IU/L (ref 0–44)
AST: 55 IU/L — ABNORMAL HIGH (ref 0–40)
Albumin/Globulin Ratio: 1.4 (ref 1.1–2.5)
Albumin: 3.8 g/dL (ref 3.6–4.8)
Alkaline Phosphatase: 171 IU/L — ABNORMAL HIGH (ref 39–117)
BUN/Creatinine Ratio: 10 (ref 10–22)
BUN: 10 mg/dL (ref 8–27)
CO2: 22 mmol/L (ref 18–29)
Calcium: 9.3 mg/dL (ref 8.6–10.2)
Chloride: 104 mmol/L (ref 97–108)
Creatinine, Ser: 1.02 mg/dL (ref 0.76–1.27)
GFR calc Af Amer: 88 mL/min/{1.73_m2} (ref >59)
GFR calc non Af Amer: 76 mL/min/{1.73_m2} (ref >59)
Globulin, Total: 2.7 g/dL (ref 1.5–4.5)
Glucose: 81 mg/dL (ref 65–99)
Potassium: 3.8 mmol/L (ref 3.5–5.2)
Sodium: 140 mmol/L (ref 134–144)
Total Bilirubin: 1.6 mg/dL — ABNORMAL HIGH (ref 0.0–1.2)
Total Protein: 6.5 g/dL (ref 6.0–8.5)

## 2013-03-31 LAB — PSA, TOTAL AND FREE
PSA, Free Pct: >10 %
PSA, Free: 0.01 ng/mL
PSA: <0.1 ng/mL (ref 0.0–4.0)

## 2013-03-31 LAB — LIPID PANEL
Chol/HDL Ratio: 3.2 ratio units (ref 0.0–5.0)
Cholesterol, Total: 130 mg/dL (ref 100–199)
HDL: 41 mg/dL (ref >39)
LDL Calculated: 61 mg/dL (ref 0–99)
Triglycerides: 139 mg/dL (ref 0–149)
VLDL Cholesterol Cal: 28 mg/dL (ref 5–40)

## 2013-03-31 LAB — TSH: TSH: 3.36 u[IU]/mL (ref 0.450–4.500)

## 2013-04-03 ENCOUNTER — Other Ambulatory Visit: Payer: Self-pay | Admitting: Family Medicine

## 2013-04-03 DIAGNOSIS — R945 Abnormal results of liver function studies: Secondary | ICD-10-CM

## 2013-04-03 DIAGNOSIS — R7989 Other specified abnormal findings of blood chemistry: Secondary | ICD-10-CM

## 2013-04-04 ENCOUNTER — Telehealth: Payer: Self-pay | Admitting: Family Medicine

## 2013-04-04 NOTE — Telephone Encounter (Signed)
Message copied by Waverly Ferrari on Tue Apr 04, 2013 12:30 PM ------      Message from: Lysbeth Penner      Created: Mon Apr 03, 2013  5:42 PM       Transaminases elevated, alk phos elevated, bili elevated get liver US.  Wbc's elevated.  ? Any Samuel Germany sx's?  PSA is <.01 so this is good since he has had radical prostectomy.  Get acute hepatitis panel. ------

## 2013-04-11 ENCOUNTER — Other Ambulatory Visit: Payer: Self-pay | Admitting: Family Medicine

## 2013-04-11 ENCOUNTER — Ambulatory Visit (HOSPITAL_COMMUNITY)
Admission: RE | Admit: 2013-04-11 | Discharge: 2013-04-11 | Disposition: A | Discharge status: Home / Self Care | Payer: Medicare Other | Source: Ambulatory Visit | Attending: Family Medicine | Admitting: Family Medicine

## 2013-04-11 DIAGNOSIS — R945 Abnormal results of liver function studies: Secondary | ICD-10-CM

## 2013-04-11 DIAGNOSIS — R160 Hepatomegaly, not elsewhere classified: Secondary | ICD-10-CM | POA: Insufficient documentation

## 2013-04-11 DIAGNOSIS — R7989 Other specified abnormal findings of blood chemistry: Secondary | ICD-10-CM

## 2013-04-11 DIAGNOSIS — R748 Abnormal levels of other serum enzymes: Secondary | ICD-10-CM | POA: Insufficient documentation

## 2013-08-14 NOTE — Patient Instructions (Signed)
Your procedure is scheduled on: 08/21/2013  Report to [facility] at  18 AM.  Call this number if you have problems the morning of surgery: [phone]   Do not eat food or drink liquids :After Midnight.      Take these medicines the morning of surgery with A SIP OF WATER: xanax, propranolol, effexor   Do not wear jewelry, make-up or nail polish.  Do not wear lotions, powders, or perfumes.   Do not shave 48 hours prior to surgery.  Do not bring valuables to the hospital.  Contacts, dentures or bridgework may not be worn into surgery.  Leave suitcase in the car. After surgery it may be brought to your room.  For patients admitted to the hospital, checkout time is 11:00 AM the day of discharge.   Patients discharged the day of surgery will not be allowed to drive home.  :     Please read over the following fact sheets that you were given: Coughing and Deep Breathing, Surgical Site Infection Prevention, Anesthesia Post-op Instructions and Care and Recovery After Surgery    Cataract A cataract is a clouding of the lens of the eye. When a lens becomes cloudy, vision is reduced based on the degree and nature of the clouding. Many cataracts reduce vision to some degree. Some cataracts make people more near-sighted as they develop. Other cataracts increase glare. Cataracts that are ignored and become worse can sometimes look white. The white color can be seen through the pupil. CAUSES   Aging. However, cataracts may occur at any age, even in newborns.   Certain drugs.   Trauma to the eye.   Certain diseases such as diabetes.   Specific eye diseases such as chronic inflammation inside the eye or a sudden attack of a rare form of glaucoma.   Inherited or acquired medical problems.  SYMPTOMS   Gradual, progressive drop in vision in the affected eye.   Severe, rapid visual loss. This most often happens when trauma is the cause.  DIAGNOSIS  To detect a cataract, an eye doctor examines the  lens. Cataracts are best diagnosed with an exam of the eyes with the pupils enlarged (dilated) by drops.  TREATMENT  For an early cataract, vision may improve by using different eyeglasses or stronger lighting. If that does not help your vision, surgery is the only effective treatment. A cataract needs to be surgically removed when vision loss interferes with your everyday activities, such as driving, reading, or watching TV. A cataract may also have to be removed if it prevents examination or treatment of another eye problem. Surgery removes the cloudy lens and usually replaces it with a substitute lens (intraocular lens, IOL).  At a time when both you and your doctor agree, the cataract will be surgically removed. If you have cataracts in both eyes, only one is usually removed at a time. This allows the operated eye to heal and be out of danger from any possible problems after surgery (such as infection or poor wound healing). In rare cases, a cataract may be doing damage to your eye. In these cases, your caregiver may advise surgical removal right away. The vast majority of people who have cataract surgery have better vision afterward. HOME CARE INSTRUCTIONS  If you are not planning surgery, you may be asked to do the following:  Use different eyeglasses.   Use stronger or brighter lighting.   Ask your eye doctor about reducing your medicine dose or changing medicines  if it is thought that a medicine caused your cataract. Changing medicines does not make the cataract go away on its own.   Become familiar with your surroundings. Poor vision can lead to injury. Avoid bumping into things on the affected side. You are at a higher risk for tripping or falling.   Exercise extreme care when driving or operating machinery.   Wear sunglasses if you are sensitive to bright light or experiencing problems with glare.  SEEK IMMEDIATE MEDICAL CARE IF:   You have a worsening or sudden vision loss.   You  notice redness, swelling, or increasing pain in the eye.   You have a fever.  Document Released: 01/26/2005 Document Revised: 01/15/2011 Document Reviewed: 09/19/2010 [facility] Patient Information 2012 [place].PATIENT INSTRUCTIONS POST-ANESTHESIA  IMMEDIATELY FOLLOWING SURGERY:  Do not drive or operate machinery for the first twenty four hours after surgery.  Do not make any important decisions for twenty four hours after surgery or while taking narcotic pain medications or sedatives.  If you develop intractable nausea and vomiting or a severe headache please notify your doctor immediately.  FOLLOW-UP:  Please make an appointment with your surgeon as instructed. You do not need to follow up with anesthesia unless specifically instructed to do so.  WOUND CARE INSTRUCTIONS (if applicable):  Keep a dry clean dressing on the anesthesia/puncture wound site if there is drainage.  Once the wound has quit draining you may leave it open to air.  Generally you should leave the bandage intact for twenty four hours unless there is drainage.  If the epidural site drains for more than 36-48 hours please call the anesthesia department.  QUESTIONS?:  Please feel free to call your physician or the hospital operator if you have any questions, and they will be happy to assist you.

## 2013-08-15 ENCOUNTER — Other Ambulatory Visit: Payer: Self-pay

## 2013-08-15 ENCOUNTER — Encounter (HOSPITAL_COMMUNITY): Payer: Self-pay

## 2013-08-15 ENCOUNTER — Encounter (HOSPITAL_COMMUNITY)
Admission: RE | Admit: 2013-08-15 | Discharge: 2013-08-15 | Disposition: A | Discharge status: Home / Self Care | Payer: Medicare Other | Source: Ambulatory Visit | Attending: Ophthalmology | Admitting: Ophthalmology

## 2013-08-15 DIAGNOSIS — Z01812 Encounter for preprocedural laboratory examination: Secondary | ICD-10-CM | POA: Insufficient documentation

## 2013-08-15 DIAGNOSIS — Z01810 Encounter for preprocedural cardiovascular examination: Secondary | ICD-10-CM | POA: Insufficient documentation

## 2013-08-15 LAB — BASIC METABOLIC PANEL
Anion gap: 12 (ref 5–15)
BUN: 8 mg/dL (ref 6–23)
CHLORIDE: 103 meq/L (ref 96–112)
CO2: 24 mEq/L (ref 19–32)
Calcium: 10.3 mg/dL (ref 8.4–10.5)
Creatinine, Ser: 0.78 mg/dL (ref 0.50–1.35)
GFR calc Af Amer: >90 mL/min (ref >90)
GFR calc non Af Amer: >90 mL/min (ref >90)
Glucose, Bld: 95 mg/dL (ref 70–99)
Potassium: 4.4 mEq/L (ref 3.7–5.3)
SODIUM: 139 meq/L (ref 137–147)

## 2013-08-15 LAB — HEMOGLOBIN AND HEMATOCRIT, BLOOD
HCT: 44.5 % (ref 39.0–52.0)
HEMOGLOBIN: 15.2 g/dL (ref 13.0–17.0)

## 2013-08-18 MED ORDER — LIDOCAINE HCL 3.5 % OP GEL
OPHTHALMIC | Status: AC
  Filled 2013-08-18: qty 1

## 2013-08-18 MED ORDER — LIDOCAINE HCL (PF) 1 % IJ SOLN
INTRAMUSCULAR | Status: AC
  Filled 2013-08-18: qty 2

## 2013-08-18 MED ORDER — TETRACAINE HCL 0.5 % OP SOLN
OPHTHALMIC | Status: AC
  Filled 2013-08-18: qty 2

## 2013-08-18 MED ORDER — NEOMYCIN-POLYMYXIN-DEXAMETH 3.5-10000-0.1 OP SUSP
OPHTHALMIC | Status: AC
  Filled 2013-08-18: qty 5

## 2013-08-18 MED ORDER — PHENYLEPHRINE HCL 2.5 % OP SOLN
OPHTHALMIC | Status: AC
  Filled 2013-08-18: qty 15

## 2013-08-18 MED ORDER — CYCLOPENTOLATE-PHENYLEPHRINE OP SOLN OPTIME - NO CHARGE
OPHTHALMIC | Status: AC
  Filled 2013-08-18: qty 2

## 2013-08-21 ENCOUNTER — Ambulatory Visit (HOSPITAL_COMMUNITY)
Admission: RE | Admit: 2013-08-21 | Discharge: 2013-08-21 | Disposition: A | Discharge status: Home / Self Care | Payer: Medicare Other | Source: Ambulatory Visit | Attending: Ophthalmology | Admitting: Ophthalmology

## 2013-08-21 ENCOUNTER — Encounter (HOSPITAL_COMMUNITY): Payer: Medicare Other | Admitting: Anesthesiology

## 2013-08-21 ENCOUNTER — Ambulatory Visit (HOSPITAL_COMMUNITY): Payer: Medicare Other | Admitting: Anesthesiology

## 2013-08-21 ENCOUNTER — Encounter (HOSPITAL_COMMUNITY): Payer: Self-pay | Admitting: Anesthesiology

## 2013-08-21 ENCOUNTER — Encounter (HOSPITAL_COMMUNITY)
Admission: RE | Disposition: A | Discharge status: Home / Self Care | Payer: Self-pay | Source: Ambulatory Visit | Attending: Ophthalmology

## 2013-08-21 DIAGNOSIS — H269 Unspecified cataract: Secondary | ICD-10-CM | POA: Insufficient documentation

## 2013-08-21 DIAGNOSIS — E7800 Pure hypercholesterolemia, unspecified: Secondary | ICD-10-CM | POA: Insufficient documentation

## 2013-08-21 DIAGNOSIS — F411 Generalized anxiety disorder: Secondary | ICD-10-CM | POA: Insufficient documentation

## 2013-08-21 DIAGNOSIS — Z8546 Personal history of malignant neoplasm of prostate: Secondary | ICD-10-CM | POA: Insufficient documentation

## 2013-08-21 DIAGNOSIS — I10 Essential (primary) hypertension: Secondary | ICD-10-CM | POA: Insufficient documentation

## 2013-08-21 DIAGNOSIS — Z79899 Other long term (current) drug therapy: Secondary | ICD-10-CM | POA: Insufficient documentation

## 2013-08-21 HISTORY — PX: CATARACT EXTRACTION W/PHACO: SHX586

## 2013-08-21 SURGERY — PHACOEMULSIFICATION, CATARACT, WITH IOL INSERTION
Anesthesia: Monitor Anesthesia Care | Site: Eye | Laterality: Right

## 2013-08-21 MED ORDER — MIDAZOLAM HCL 2 MG/2ML IJ SOLN
1.0000 mg | INTRAMUSCULAR | Status: DC | PRN
  Administered 2013-08-21 (×2): 2 mg via INTRAVENOUS
  Filled 2013-08-21: qty 2

## 2013-08-21 MED ORDER — FENTANYL CITRATE 0.05 MG/ML IJ SOLN
INTRAMUSCULAR | Status: AC
  Filled 2013-08-21: qty 2

## 2013-08-21 MED ORDER — POVIDONE-IODINE 5 % OP SOLN
OPHTHALMIC | Status: DC | PRN
  Administered 2013-08-21: 1 via OPHTHALMIC

## 2013-08-21 MED ORDER — LACTATED RINGERS IV SOLN
INTRAVENOUS | Status: DC
  Administered 2013-08-21: 1000 mL via INTRAVENOUS

## 2013-08-21 MED ORDER — EPINEPHRINE HCL 1 MG/ML IJ SOLN
INTRAMUSCULAR | Status: AC
  Filled 2013-08-21: qty 1

## 2013-08-21 MED ORDER — LIDOCAINE 3.5 % OP GEL OPTIME - NO CHARGE
OPHTHALMIC | Status: DC | PRN
  Administered 2013-08-21: 1 [drp] via OPHTHALMIC

## 2013-08-21 MED ORDER — EPINEPHRINE HCL 1 MG/ML IJ SOLN
INTRAOCULAR | Status: DC | PRN
  Administered 2013-08-21: 09:00:00

## 2013-08-21 MED ORDER — TETRACAINE HCL 0.5 % OP SOLN
1.0000 [drp] | OPHTHALMIC | Status: AC
  Administered 2013-08-21 (×3): 1 [drp] via OPHTHALMIC

## 2013-08-21 MED ORDER — NEOMYCIN-POLYMYXIN-DEXAMETH 3.5-10000-0.1 OP SUSP
OPHTHALMIC | Status: DC | PRN
  Administered 2013-08-21: 2 [drp] via OPHTHALMIC

## 2013-08-21 MED ORDER — LIDOCAINE HCL (PF) 1 % IJ SOLN
INTRAMUSCULAR | Status: DC | PRN
  Administered 2013-08-21: .4 mL

## 2013-08-21 MED ORDER — PHENYLEPHRINE HCL 2.5 % OP SOLN
1.0000 [drp] | OPHTHALMIC | Status: AC
  Administered 2013-08-21 (×3): 1 [drp] via OPHTHALMIC

## 2013-08-21 MED ORDER — PROVISC 10 MG/ML IO SOLN
INTRAOCULAR | Status: DC | PRN
  Administered 2013-08-21: 0.85 mL via INTRAOCULAR

## 2013-08-21 MED ORDER — LIDOCAINE HCL 3.5 % OP GEL
1.0000 | Freq: Once | OPHTHALMIC | Status: AC
Start: 2013-08-21 — End: 2013-08-21
  Administered 2013-08-21: 1 via OPHTHALMIC

## 2013-08-21 MED ORDER — BSS IO SOLN
INTRAOCULAR | Status: DC | PRN
  Administered 2013-08-21: 15 mL via INTRAOCULAR

## 2013-08-21 MED ORDER — CYCLOPENTOLATE-PHENYLEPHRINE 0.2-1 % OP SOLN
1.0000 [drp] | OPHTHALMIC | Status: AC
  Administered 2013-08-21 (×3): 1 [drp] via OPHTHALMIC

## 2013-08-21 MED ORDER — MIDAZOLAM HCL 2 MG/2ML IJ SOLN
INTRAMUSCULAR | Status: AC
  Filled 2013-08-21: qty 2

## 2013-08-21 MED ORDER — FENTANYL CITRATE 0.05 MG/ML IJ SOLN
25.0000 ug | INTRAMUSCULAR | Status: AC
  Administered 2013-08-21 (×2): 25 ug via INTRAVENOUS

## 2013-08-21 SURGICAL SUPPLY — 33 items
CAPSULAR TENSION RING-AMO (OPHTHALMIC RELATED) IMPLANT
CLOTH BEACON ORANGE TIMEOUT ST (SAFETY) ×3 IMPLANT
EYE SHIELD UNIVERSAL CLEAR (GAUZE/BANDAGES/DRESSINGS) ×3 IMPLANT
GLOVE BIO SURGEON STRL SZ 6.5 (GLOVE) IMPLANT
GLOVE BIO SURGEONS STRL SZ 6.5 (GLOVE)
GLOVE BIOGEL PI IND STRL 6.5 (GLOVE) IMPLANT
GLOVE BIOGEL PI IND STRL 7.0 (GLOVE) ×1 IMPLANT
GLOVE BIOGEL PI IND STRL 7.5 (GLOVE) IMPLANT
GLOVE BIOGEL PI INDICATOR 6.5 (GLOVE)
GLOVE BIOGEL PI INDICATOR 7.0 (GLOVE) ×2
GLOVE BIOGEL PI INDICATOR 7.5 (GLOVE)
GLOVE ECLIPSE 6.5 STRL STRAW (GLOVE) IMPLANT
GLOVE ECLIPSE 7.0 STRL STRAW (GLOVE) IMPLANT
GLOVE ECLIPSE 7.5 STRL STRAW (GLOVE) IMPLANT
GLOVE EXAM NITRILE LRG STRL (GLOVE) IMPLANT
GLOVE EXAM NITRILE MD LF STRL (GLOVE) IMPLANT
GLOVE SKINSENSE NS SZ6.5 (GLOVE)
GLOVE SKINSENSE NS SZ7.0 (GLOVE) ×2
GLOVE SKINSENSE STRL SZ6.5 (GLOVE) IMPLANT
GLOVE SKINSENSE STRL SZ7.0 (GLOVE) ×1 IMPLANT
KIT VITRECTOMY (OPHTHALMIC RELATED) IMPLANT
PAD ARMBOARD 7.5X6 YLW CONV (MISCELLANEOUS) ×3 IMPLANT
PROC W NO LENS (INTRAOCULAR LENS)
PROC W SPEC LENS (INTRAOCULAR LENS)
PROCESS W NO LENS (INTRAOCULAR LENS) IMPLANT
PROCESS W SPEC LENS (INTRAOCULAR LENS) IMPLANT
RING MALYGIN (MISCELLANEOUS) IMPLANT
SIGHTPATH CAT PROC W REG LENS (Ophthalmic Related) ×3 IMPLANT
SYR TB 1ML LL NO SAFETY (SYRINGE) ×3 IMPLANT
TAPE SURG TRANSPORE 1 IN (GAUZE/BANDAGES/DRESSINGS) ×1 IMPLANT
TAPE SURGICAL TRANSPORE 1 IN (GAUZE/BANDAGES/DRESSINGS) ×2
VISCOELASTIC ADDITIONAL (OPHTHALMIC RELATED) IMPLANT
WATER STERILE IRR 250ML POUR (IV SOLUTION) ×3 IMPLANT

## 2013-08-21 NOTE — Op Note (Signed)
Date of Admission: 08/21/2013  Date of Surgery: 08/21/2013   Pre-Op Dx: Cataract Right Eye  Post-Op Dx: Combined Cataract Right  Eye,  Dx Code 366.19  Surgeon: Tonny Branch, M.D.  Assistants: None  Anesthesia: Topical with MAC  Indications: Painless, progressive loss of vision with compromise of daily activities.  Surgery: Cataract Extraction with Intraocular lens Implant Right Eye  Discription: The patient had dilating drops and viscous lidocaine placed into the Right eye in the pre-op holding area. After transfer to the operating room, a time out was performed. The patient was then prepped and draped. Beginning with a 74 degree blade a paracentesis port was made at the surgeon's 2 o'clock position. The anterior chamber was then filled with 1% non-preserved lidocaine. This was followed by filling the anterior chamber with Provisc.  A 2.32mm keratome blade was used to make a clear corneal incision at the temporal limbus.  A bent cystatome needle was used to create a continuous tear capsulotomy. Hydrodissection was performed with balanced salt solution on a Fine canula. The lens nucleus was then removed using the phacoemulsification handpiece. Residual cortex was removed with the I&A handpiece. The anterior chamber and capsular bag were refilled with Provisc. A posterior chamber intraocular lens was placed into the capsular bag with it's injector. The implant was positioned with the Kuglan hook. The Provisc was then removed from the anterior chamber and capsular bag with the I&A handpiece. Stromal hydration of the main incision and paracentesis port was performed with BSS on a Fine canula. The wounds were tested for leak which was negative. The patient tolerated the procedure well. There were no operative complications. The patient was then transferred to the recovery room in stable condition.  Complications: None  Specimen: None  EBL: None  Prosthetic device: Hoya iSert 250, power 20.5 D, SN  [id number].

## 2013-08-21 NOTE — Transfer of Care (Signed)
Immediate Anesthesia Transfer of Care Note  Patient: Lee Wang  Procedure(s) Performed: Procedure(s) with comments: CATARACT EXTRACTION PHACO AND INTRAOCULAR LENS PLACEMENT (IOC) (Right) - CDE 7.34  Patient Location: Short Stay  Anesthesia Type:MAC  Level of Consciousness: awake  Airway & Oxygen Therapy: Patient Spontanous Breathing  Post-op Assessment: Report given to PACU RN  Post vital signs: Reviewed  Complications: No apparent anesthesia complications

## 2013-08-21 NOTE — Discharge Instructions (Signed)

## 2013-08-21 NOTE — Anesthesia Preprocedure Evaluation (Signed)
Anesthesia Evaluation  Patient identified by MRN, date of birth, ID band Patient awake    Reviewed: Allergy & Precautions, H&P , NPO status , Patient's Chart, lab work & pertinent test results, reviewed documented beta blocker date and time   Airway Mallampati: I TM Distance: >3 FB Neck ROM: Full    Dental  (+) Edentulous Upper, Edentulous Lower   Pulmonary Current Smoker,  breath sounds clear to auscultation        Cardiovascular hypertension, Pt. on home beta blockers and Pt. on medications Rhythm:Regular Rate:Normal     Neuro/Psych PSYCHIATRIC DISORDERS Anxiety Depression    GI/Hepatic (+)     substance abuse  alcohol use,   Endo/Other    Renal/GU      Musculoskeletal   Abdominal   Peds  Hematology   Anesthesia Other Findings   Reproductive/Obstetrics                           Anesthesia Physical Anesthesia Plan  ASA: III  Anesthesia Plan: MAC   Post-op Pain Management:    Induction: Intravenous  Airway Management Planned: Nasal Cannula  Additional Equipment:   Intra-op Plan:   Post-operative Plan:   Informed Consent: I have reviewed the patients History and Physical, chart, labs and discussed the procedure including the risks, benefits and alternatives for the proposed anesthesia with the patient or authorized representative who has indicated his/her understanding and acceptance.     Plan Discussed with:   Anesthesia Plan Comments:         Anesthesia Quick Evaluation

## 2013-08-21 NOTE — H&P (Signed)
I have reviewed the H&P, the patient was re-examined, and I have identified no interval changes in medical condition and plan of care since the history and physical of record  

## 2013-08-21 NOTE — Anesthesia Postprocedure Evaluation (Signed)
  Anesthesia Post-op Note  Patient: Lee Wang  Procedure(s) Performed: Procedure(s) with comments: CATARACT EXTRACTION PHACO AND INTRAOCULAR LENS PLACEMENT (IOC) (Right) - CDE 7.34  Patient Location: Short Stay  Anesthesia Type:MAC  Level of Consciousness: awake, alert  and oriented  Airway and Oxygen Therapy: Patient Spontanous Breathing  Post-op Pain: none  Post-op Assessment: Post-op Vital signs reviewed, Patient's Cardiovascular Status Stable, Respiratory Function Stable, Patent Airway and No signs of Nausea or vomiting  Post-op Vital Signs: Reviewed and stable  Last Vitals:  Filed Vitals:   08/21/13 0825  BP: 103/61  Temp:   Resp: 12    Complications: No apparent anesthesia complications

## 2013-08-22 ENCOUNTER — Encounter (HOSPITAL_COMMUNITY): Payer: Self-pay | Admitting: Ophthalmology

## 2013-09-07 MED ORDER — ONDANSETRON HCL 4 MG/2ML IJ SOLN: 4.0000 mg | Freq: Once | INTRAMUSCULAR | Status: AC | PRN

## 2013-09-07 MED ORDER — FENTANYL CITRATE 0.05 MG/ML IJ SOLN: 25.0000 ug | INTRAMUSCULAR | Status: DC | PRN

## 2013-09-08 ENCOUNTER — Encounter (HOSPITAL_COMMUNITY)
Admission: RE | Admit: 2013-09-08 | Discharge: 2013-09-08 | Disposition: A | Discharge status: Home / Self Care | Payer: Medicare Other | Source: Ambulatory Visit | Attending: Ophthalmology | Admitting: Ophthalmology

## 2013-09-08 ENCOUNTER — Encounter (HOSPITAL_COMMUNITY): Payer: Self-pay

## 2013-09-13 MED ORDER — PHENYLEPHRINE HCL 2.5 % OP SOLN
OPHTHALMIC | Status: AC
  Filled 2013-09-13: qty 15

## 2013-09-13 MED ORDER — CYCLOPENTOLATE-PHENYLEPHRINE OP SOLN OPTIME - NO CHARGE
OPHTHALMIC | Status: AC
  Filled 2013-09-13: qty 2

## 2013-09-13 MED ORDER — LIDOCAINE HCL 3.5 % OP GEL
OPHTHALMIC | Status: AC
  Filled 2013-09-13: qty 1

## 2013-09-13 MED ORDER — TETRACAINE HCL 0.5 % OP SOLN
OPHTHALMIC | Status: AC
  Filled 2013-09-13: qty 2

## 2013-09-13 MED ORDER — NEOMYCIN-POLYMYXIN-DEXAMETH 3.5-10000-0.1 OP SUSP
OPHTHALMIC | Status: AC
  Filled 2013-09-13: qty 5

## 2013-09-13 MED ORDER — LIDOCAINE HCL (PF) 1 % IJ SOLN
INTRAMUSCULAR | Status: AC
  Filled 2013-09-13: qty 2

## 2013-09-14 ENCOUNTER — Encounter (HOSPITAL_COMMUNITY)
Admission: RE | Disposition: A | Discharge status: Home / Self Care | Payer: Self-pay | Source: Ambulatory Visit | Attending: Ophthalmology

## 2013-09-14 ENCOUNTER — Encounter (HOSPITAL_COMMUNITY): Payer: Self-pay | Admitting: *Deleted

## 2013-09-14 ENCOUNTER — Ambulatory Visit (HOSPITAL_COMMUNITY): Payer: Medicare Other | Admitting: Anesthesiology

## 2013-09-14 ENCOUNTER — Encounter (HOSPITAL_COMMUNITY): Payer: Medicare Other | Admitting: Anesthesiology

## 2013-09-14 ENCOUNTER — Ambulatory Visit (HOSPITAL_COMMUNITY)
Admission: RE | Admit: 2013-09-14 | Discharge: 2013-09-14 | Disposition: A | Discharge status: Home / Self Care | Payer: Medicare Other | Source: Ambulatory Visit | Attending: Ophthalmology | Admitting: Ophthalmology

## 2013-09-14 DIAGNOSIS — Z79899 Other long term (current) drug therapy: Secondary | ICD-10-CM | POA: Insufficient documentation

## 2013-09-14 DIAGNOSIS — H2589 Other age-related cataract: Secondary | ICD-10-CM | POA: Insufficient documentation

## 2013-09-14 DIAGNOSIS — I10 Essential (primary) hypertension: Secondary | ICD-10-CM | POA: Insufficient documentation

## 2013-09-14 DIAGNOSIS — F411 Generalized anxiety disorder: Secondary | ICD-10-CM | POA: Insufficient documentation

## 2013-09-14 DIAGNOSIS — F17200 Nicotine dependence, unspecified, uncomplicated: Secondary | ICD-10-CM | POA: Diagnosis not present

## 2013-09-14 HISTORY — PX: CATARACT EXTRACTION W/PHACO: SHX586

## 2013-09-14 SURGERY — PHACOEMULSIFICATION, CATARACT, WITH IOL INSERTION
Anesthesia: Monitor Anesthesia Care | Site: Eye | Laterality: Left

## 2013-09-14 MED ORDER — EPINEPHRINE HCL 1 MG/ML IJ SOLN
INTRAMUSCULAR | Status: AC
  Filled 2013-09-14: qty 1

## 2013-09-14 MED ORDER — CYCLOPENTOLATE-PHENYLEPHRINE 0.2-1 % OP SOLN
1.0000 [drp] | OPHTHALMIC | Status: AC
  Administered 2013-09-14 (×3): 1 [drp] via OPHTHALMIC

## 2013-09-14 MED ORDER — TETRACAINE HCL 0.5 % OP SOLN
1.0000 [drp] | OPHTHALMIC | Status: AC
  Administered 2013-09-14 (×3): 1 [drp] via OPHTHALMIC

## 2013-09-14 MED ORDER — LIDOCAINE 3.5 % OP GEL OPTIME - NO CHARGE
OPHTHALMIC | Status: DC | PRN
  Administered 2013-09-14: 1 [drp] via OPHTHALMIC

## 2013-09-14 MED ORDER — PHENYLEPHRINE HCL 2.5 % OP SOLN
1.0000 [drp] | OPHTHALMIC | Status: AC
  Administered 2013-09-14 (×3): 1 [drp] via OPHTHALMIC

## 2013-09-14 MED ORDER — BSS IO SOLN
INTRAOCULAR | Status: DC | PRN
  Administered 2013-09-14: 09:00:00

## 2013-09-14 MED ORDER — LIDOCAINE HCL (PF) 1 % IJ SOLN
INTRAOCULAR | Status: DC | PRN
  Administered 2013-09-14: 09:00:00 via OPHTHALMIC

## 2013-09-14 MED ORDER — NEOMYCIN-POLYMYXIN-DEXAMETH 3.5-10000-0.1 OP SUSP
OPHTHALMIC | Status: DC | PRN
  Administered 2013-09-14: 1 [drp] via OPHTHALMIC

## 2013-09-14 MED ORDER — PROVISC 10 MG/ML IO SOLN
INTRAOCULAR | Status: DC | PRN
Start: 2013-09-14 — End: 2013-09-14
  Administered 2013-09-14: 0.85 mL via INTRAOCULAR

## 2013-09-14 MED ORDER — FENTANYL CITRATE 0.05 MG/ML IJ SOLN
25.0000 ug | INTRAMUSCULAR | Status: AC
Start: 2013-09-14 — End: 2013-09-14
  Administered 2013-09-14 (×2): 25 ug via INTRAVENOUS
  Filled 2013-09-14: qty 2

## 2013-09-14 MED ORDER — MIDAZOLAM HCL 2 MG/2ML IJ SOLN
1.0000 mg | INTRAMUSCULAR | Status: DC | PRN
  Administered 2013-09-14: 2 mg via INTRAVENOUS
  Filled 2013-09-14: qty 2

## 2013-09-14 MED ORDER — LIDOCAINE HCL 3.5 % OP GEL
1.0000 "application " | Freq: Once | OPHTHALMIC | Status: AC
  Administered 2013-09-14: 1 via OPHTHALMIC

## 2013-09-14 MED ORDER — LACTATED RINGERS IV SOLN
INTRAVENOUS | Status: DC
  Administered 2013-09-14: 09:00:00 via INTRAVENOUS

## 2013-09-14 MED ORDER — BSS IO SOLN
INTRAOCULAR | Status: DC | PRN
  Administered 2013-09-14: 15 mL via INTRAOCULAR

## 2013-09-14 MED ORDER — POVIDONE-IODINE 5 % OP SOLN
OPHTHALMIC | Status: DC | PRN
  Administered 2013-09-14: 1 via OPHTHALMIC

## 2013-09-14 SURGICAL SUPPLY — 34 items
CAPSULAR TENSION RING-AMO (OPHTHALMIC RELATED) IMPLANT
CLOTH BEACON ORANGE TIMEOUT ST (SAFETY) ×3 IMPLANT
EYE SHIELD UNIVERSAL CLEAR (GAUZE/BANDAGES/DRESSINGS) ×3 IMPLANT
GLOVE BIO SURGEON STRL SZ 6.5 (GLOVE) IMPLANT
GLOVE BIO SURGEONS STRL SZ 6.5 (GLOVE)
GLOVE BIOGEL PI IND STRL 6.5 (GLOVE) ×1 IMPLANT
GLOVE BIOGEL PI IND STRL 7.0 (GLOVE) IMPLANT
GLOVE BIOGEL PI IND STRL 7.5 (GLOVE) IMPLANT
GLOVE BIOGEL PI INDICATOR 6.5 (GLOVE) ×2
GLOVE BIOGEL PI INDICATOR 7.0 (GLOVE)
GLOVE BIOGEL PI INDICATOR 7.5 (GLOVE)
GLOVE ECLIPSE 6.5 STRL STRAW (GLOVE) IMPLANT
GLOVE ECLIPSE 7.0 STRL STRAW (GLOVE) IMPLANT
GLOVE ECLIPSE 7.5 STRL STRAW (GLOVE) IMPLANT
GLOVE EXAM NITRILE LRG STRL (GLOVE) IMPLANT
GLOVE EXAM NITRILE MD LF STRL (GLOVE) ×3 IMPLANT
GLOVE SKINSENSE NS SZ6.5 (GLOVE)
GLOVE SKINSENSE NS SZ7.0 (GLOVE)
GLOVE SKINSENSE STRL SZ6.5 (GLOVE) IMPLANT
GLOVE SKINSENSE STRL SZ7.0 (GLOVE) IMPLANT
KIT VITRECTOMY (OPHTHALMIC RELATED) IMPLANT
PAD ARMBOARD 7.5X6 YLW CONV (MISCELLANEOUS) ×3 IMPLANT
PROC W NO LENS (INTRAOCULAR LENS)
PROC W SPEC LENS (INTRAOCULAR LENS)
PROCESS W NO LENS (INTRAOCULAR LENS) IMPLANT
PROCESS W SPEC LENS (INTRAOCULAR LENS) IMPLANT
RETRACTOR IRIS SIGHTPATH (OPHTHALMIC RELATED) IMPLANT
RING MALYGIN (MISCELLANEOUS) IMPLANT
SIGHTPATH CAT PROC W REG LENS (Ophthalmic Related) ×3 IMPLANT
SYRINGE LUER LOK 1CC (MISCELLANEOUS) ×3 IMPLANT
TAPE SURG TRANSPORE 1 IN (GAUZE/BANDAGES/DRESSINGS) ×1 IMPLANT
TAPE SURGICAL TRANSPORE 1 IN (GAUZE/BANDAGES/DRESSINGS) ×2
VISCOELASTIC ADDITIONAL (OPHTHALMIC RELATED) IMPLANT
WATER STERILE IRR 250ML POUR (IV SOLUTION) ×3 IMPLANT

## 2013-09-14 NOTE — Anesthesia Preprocedure Evaluation (Signed)
Anesthesia Evaluation  Patient identified by MRN, date of birth, ID band Patient awake    Reviewed: Allergy & Precautions, H&P , NPO status , Patient's Chart, lab work & pertinent test results, reviewed documented beta blocker date and time   Airway Mallampati: I TM Distance: >3 FB Neck ROM: Full    Dental  (+) Edentulous Upper, Edentulous Lower   Pulmonary Current Smoker,  breath sounds clear to auscultation        Cardiovascular hypertension, Pt. on home beta blockers and Pt. on medications Rhythm:Regular Rate:Normal     Neuro/Psych PSYCHIATRIC DISORDERS Anxiety Depression    GI/Hepatic (+)     substance abuse  alcohol use,   Endo/Other    Renal/GU      Musculoskeletal   Abdominal   Peds  Hematology   Anesthesia Other Findings   Reproductive/Obstetrics                           Anesthesia Physical Anesthesia Plan  ASA: III  Anesthesia Plan: MAC   Post-op Pain Management:    Induction: Intravenous  Airway Management Planned: Nasal Cannula  Additional Equipment:   Intra-op Plan:   Post-operative Plan:   Informed Consent: I have reviewed the patients History and Physical, chart, labs and discussed the procedure including the risks, benefits and alternatives for the proposed anesthesia with the patient or authorized representative who has indicated his/her understanding and acceptance.     Plan Discussed with:   Anesthesia Plan Comments:         Anesthesia Quick Evaluation

## 2013-09-14 NOTE — H&P (Signed)
I have reviewed the H&P, the patient was re-examined, and I have identified no interval changes in medical condition and plan of care since the history and physical of record  

## 2013-09-14 NOTE — Anesthesia Postprocedure Evaluation (Signed)
  Anesthesia Post-op Note  Patient: Lee Wang  Procedure(s) Performed: Procedure(s) with comments: CATARACT EXTRACTION PHACO AND INTRAOCULAR LENS PLACEMENT (IOC) (Left) - CDE: 11.50  Patient Location: PACU  Anesthesia Type:MAC  Level of Consciousness: awake, alert  and oriented  Airway and Oxygen Therapy: Patient Spontanous Breathing  Post-op Pain: none  Post-op Assessment: Post-op Vital signs reviewed, Patient's Cardiovascular Status Stable, Respiratory Function Stable, Patent Airway and No signs of Nausea or vomiting  Post-op Vital Signs: Reviewed and stable  Last Vitals:  Filed Vitals:   09/14/13 0901  BP: 129/79  Temp:   Resp: 16    Complications: No apparent anesthesia complications

## 2013-09-14 NOTE — Transfer of Care (Signed)
Immediate Anesthesia Transfer of Care Note  Patient: Lee Wang  Procedure(s) Performed: Procedure(s) with comments: CATARACT EXTRACTION PHACO AND INTRAOCULAR LENS PLACEMENT (IOC) (Left) - CDE: 11.50  Patient Location: Short Stay  Anesthesia Type:MAC  Level of Consciousness: awake  Airway & Oxygen Therapy: Patient Spontanous Breathing  Post-op Assessment: Report given to PACU RN  Post vital signs: Reviewed  Complications: No apparent anesthesia complications

## 2013-09-14 NOTE — Op Note (Signed)
Date of Admission: 09/14/2013  Date of Surgery: 09/14/2013   Pre-Op Dx: Cataract Left Eye  Post-Op Dx: Senile Combined  Cataract Left  Eye,  Dx Code 366.19  Surgeon: Tonny Branch, M.D.  Assistants: None  Anesthesia: Topical with MAC  Indications: Painless, progressive loss of vision with compromise of daily activities.  Surgery: Cataract Extraction with Intraocular lens Implant Left Eye  Discription: The patient had dilating drops and viscous lidocaine placed into the Left eye in the pre-op holding area. After transfer to the operating room, a time out was performed. The patient was then prepped and draped. Beginning with a 60 degree blade a paracentesis port was made at the surgeon's 2 o'clock position. The anterior chamber was then filled with 1% non-preserved lidocaine. This was followed by filling the anterior chamber with Provisc.  A 2.81mm keratome blade was used to make a clear corneal incision at the temporal limbus.  A bent cystatome needle was used to create a continuous tear capsulotomy. Hydrodissection was performed with balanced salt solution on a Fine canula. The lens nucleus was then removed using the phacoemulsification handpiece. Residual cortex was removed with the I&A handpiece. The anterior chamber and capsular bag were refilled with Provisc. A posterior chamber intraocular lens was placed into the capsular bag with it's injector. The implant was positioned with the Kuglan hook. The Provisc was then removed from the anterior chamber and capsular bag with the I&A handpiece. Stromal hydration of the main incision and paracentesis port was performed with BSS on a Fine canula. The wounds were tested for leak which was negative. The patient tolerated the procedure well. There were no operative complications. The patient was then transferred to the recovery room in stable condition.  Complications: None  Specimen: None  EBL: None  Prosthetic device: Hoya iSert 250, power 21.0 D, SN  [id number].

## 2013-09-15 ENCOUNTER — Encounter (HOSPITAL_COMMUNITY): Payer: Self-pay | Admitting: Ophthalmology

## 2013-10-11 ENCOUNTER — Telehealth: Payer: Self-pay | Admitting: Family Medicine

## 2013-10-11 NOTE — Telephone Encounter (Signed)
Per pt son they know what med he is suppose to take and just needs appt for refills. Appt made.

## 2013-10-14 ENCOUNTER — Other Ambulatory Visit: Payer: Self-pay | Admitting: Family Medicine

## 2013-10-16 NOTE — Telephone Encounter (Signed)
Last seen 03/30/13

## 2013-10-17 ENCOUNTER — Other Ambulatory Visit: Payer: Self-pay | Admitting: Family Medicine

## 2013-10-17 NOTE — Telephone Encounter (Signed)
Called into [facility]

## 2013-10-18 ENCOUNTER — Ambulatory Visit (INDEPENDENT_AMBULATORY_CARE_PROVIDER_SITE_OTHER): Payer: Medicare Other | Admitting: Family Medicine

## 2013-10-18 VITALS — BP 107/66 | HR 57 | Temp 98.4°F | Ht 66.0 in | Wt 182.0 lb

## 2013-10-18 DIAGNOSIS — F411 Generalized anxiety disorder: Secondary | ICD-10-CM

## 2013-10-18 MED ORDER — ALPRAZOLAM 0.5 MG PO TABS: 0.5000 mg | ORAL_TABLET | Freq: Two times a day (BID) | ORAL | Status: DC | PRN

## 2013-10-18 NOTE — Progress Notes (Signed)
   Subjective:    Patient ID: Lee Wang, male    DOB: [birth year], 68 y.o.   MRN: [id number]  HPI This 68 y.o. male presents for evaluation of hypertension, hyperlipidemia, and GAD.  He needs refills.   Review of Systems No chest pain, SOB, HA, dizziness, vision change, N/V, diarrhea, constipation, dysuria, urinary urgency or frequency, myalgias, arthralgias or rash.     Objective:   Physical Exam Vital signs noted  Well developed well nourished male.  HEENT - Head atraumatic Normocephalic                Eyes - PERRLA, Conjuctiva - clear Sclera- Clear EOMI                Ears - EAC's Wnl TM's Wnl Gross Hearing WNL                Nose - Nares patent                 Throat - oropharanx wnl Respiratory - Lungs CTA bilateral Cardiac - RRR S1 and S2 without murmur GI - Abdomen soft Nontender and bowel sounds active x 4 Extremities - No edema. Neuro - Grossly intact.       Assessment & Plan:  GAD (generalized anxiety disorder) - Plan: ALPRAZolam (XANAX) 0.5 MG tablet, DISCONTINUED: ALPRAZolam Duanne Moron) 0.5 MG tablet  Lysbeth Penner FNP

## 2014-03-12 ENCOUNTER — Other Ambulatory Visit: Payer: Self-pay | Admitting: Family Medicine

## 2014-03-13 NOTE — Telephone Encounter (Signed)
Last filled 02/09/14, last seen 10/18/13. Call into [facility] if approved

## 2014-03-14 NOTE — Telephone Encounter (Signed)
Refill to [facility] called

## 2014-03-22 ENCOUNTER — Ambulatory Visit: Payer: Self-pay | Admitting: Family Medicine

## 2014-04-16 ENCOUNTER — Other Ambulatory Visit: Payer: Self-pay | Admitting: Family Medicine

## 2014-04-18 NOTE — Telephone Encounter (Signed)
RX called into [facility] okayed per MMM

## 2014-04-18 NOTE — Telephone Encounter (Signed)
Last sen 10/19/14 [facility] [place]  If approved route to nurse to call into [facility]

## 2014-04-18 NOTE — Telephone Encounter (Signed)
Please call in xanax with 0 refills 

## 2014-04-21 ENCOUNTER — Telehealth: Payer: Self-pay | Admitting: Nurse Practitioner

## 2014-04-21 MED ORDER — PROPRANOLOL HCL 20 MG PO TABS: 20.0000 mg | ORAL_TABLET | Freq: Three times a day (TID) | ORAL | Status: DC

## 2014-04-21 NOTE — Telephone Encounter (Signed)
Inderal filled

## 2014-04-21 NOTE — Telephone Encounter (Signed)
Returned call that BP med refilled & other was done on 3/9. If nose bleeds continue to call back for an appt before 3/31. Pt states that he has had a cold and that could be causing his nose bleeds.

## 2014-05-10 ENCOUNTER — Encounter: Payer: Self-pay | Admitting: Family Medicine

## 2014-05-10 ENCOUNTER — Ambulatory Visit (INDEPENDENT_AMBULATORY_CARE_PROVIDER_SITE_OTHER): Payer: Medicare Other | Admitting: Family Medicine

## 2014-05-10 VITALS — BP 112/66 | HR 62 | Temp 97.7°F | Ht 66.0 in | Wt 160.2 lb

## 2014-05-10 DIAGNOSIS — R5383 Other fatigue: Secondary | ICD-10-CM

## 2014-05-10 DIAGNOSIS — R634 Abnormal weight loss: Secondary | ICD-10-CM | POA: Diagnosis not present

## 2014-05-10 DIAGNOSIS — E785 Hyperlipidemia, unspecified: Secondary | ICD-10-CM

## 2014-05-10 DIAGNOSIS — N400 Enlarged prostate without lower urinary tract symptoms: Secondary | ICD-10-CM

## 2014-05-10 DIAGNOSIS — I10 Essential (primary) hypertension: Secondary | ICD-10-CM | POA: Diagnosis not present

## 2014-05-10 MED ORDER — PROPRANOLOL HCL 20 MG PO TABS: 20.0000 mg | ORAL_TABLET | Freq: Three times a day (TID) | ORAL | Status: DC

## 2014-05-10 MED ORDER — FENOFIBRATE 145 MG PO TABS: 145.0000 mg | ORAL_TABLET | Freq: Every day | ORAL | Status: DC

## 2014-05-10 MED ORDER — ATORVASTATIN CALCIUM 40 MG PO TABS: 40.0000 mg | ORAL_TABLET | Freq: Every day | ORAL | Status: DC

## 2014-05-10 MED ORDER — VENLAFAXINE HCL ER 150 MG PO CP24: 150.0000 mg | ORAL_CAPSULE | Freq: Every day | ORAL | Status: DC

## 2014-05-10 MED ORDER — ALPRAZOLAM 0.5 MG PO TABS: 0.5000 mg | ORAL_TABLET | Freq: Two times a day (BID) | ORAL | Status: DC | PRN

## 2014-05-10 NOTE — Progress Notes (Signed)
Subjective:  Patient ID: Lee Wang, male    DOB: 01-Apr-1945  Age: 69 y.o. MRN: [id number]  CC: Hypertension; Hyperlipidemia; and GAD   HPI Lee Wang presents for  follow-up of hypertension. Patient has no history of headache chest pain or shortness of breath or recent cough. Patient also denies symptoms of TIA such as numbness weakness lateralizing. Patient checks  blood pressure at home and has not had any elevated readings recently. Patient denies side effects from his medication. States taking it regularly. Patient also has chronic anxiety for which she takes venlafaxine and Xanax. He's been taking this for many years. He has not had to increase the dose. He cannot do without them as a rule. However some days he can do without his a.m. dose of Xanax. Occasionally he gets shaky and nervous. He is unable to articulate his anxiety symptoms other than a sense of dread or doom and shakiness. History Lee Wang has a past medical history of Hyperlipidemia; Hypertension; Anxiety; Depression; and Cancer (prostate).   He has past surgical history that includes Prostate surgery; Fracture surgery (Bilateral); Cataract extraction w/PHACO (Right, 08/21/2013); and Cataract extraction w/PHACO (Left, 09/14/2013).   His family history includes Alzheimer's disease in his mother and paternal grandfather; Heart disease in his father.He reports that he has been smoking Cigarettes.  He started smoking about 55 years ago. He has a 50 pack-year smoking history. He does not have any smokeless tobacco history on file. He reports that he drinks about 5.0 oz of alcohol per week. He reports that he does not use illicit drugs.  No current outpatient prescriptions on file prior to visit.   No current facility-administered medications on file prior to visit.    ROS Review of Systems  Constitutional: Negative for fever, chills, diaphoresis and unexpected weight change.  HENT: Negative for congestion, hearing loss,  rhinorrhea, sore throat and trouble swallowing.   Respiratory: Negative for cough, chest tightness, shortness of breath and wheezing.   Gastrointestinal: Negative for nausea, vomiting, abdominal pain, diarrhea, constipation and abdominal distention.  Endocrine: Negative for cold intolerance and heat intolerance.  Genitourinary: Negative for dysuria, hematuria and flank pain.  Musculoskeletal: Negative for joint swelling and arthralgias.  Skin: Negative for rash.  Neurological: Negative for dizziness and headaches.  Psychiatric/Behavioral: Negative for dysphoric mood, decreased concentration and agitation. The patient is not nervous/anxious.     Objective:  BP 112/66 mmHg  Pulse 62  Temp(Src) 97.7 F (36.5 C) (Oral)  Ht _0  (1.676 m)  Wt 160 lb 3.2 oz (72.666 kg)  BMI 25.87 kg/m2  BP Readings from Last 3 Encounters:  05/10/14 112/66  10/18/13 107/66  09/14/13 126/72    Wt Readings from Last 3 Encounters:  05/10/14 160 lb 3.2 oz (72.666 kg)  10/18/13 182 lb (82.555 kg)  09/14/13 195 lb (88.451 kg)     Physical Exam  Constitutional: He is oriented to person, place, and time. He appears well-developed and well-nourished. No distress.  HENT:  Head: Normocephalic and atraumatic.  Right Ear: External ear normal.  Left Ear: External ear normal.  Nose: Nose normal.  Mouth/Throat: Oropharynx is clear and moist.  Eyes: Conjunctivae and EOM are normal. Pupils are equal, round, and reactive to light.  Neck: Normal range of motion. Neck supple. No thyromegaly present.  Cardiovascular: Normal rate, regular rhythm and normal heart sounds.   No murmur heard. Pulmonary/Chest: Effort normal and breath sounds normal. No respiratory distress. He has no wheezes. He has no rales.  Abdominal: Soft. Bowel sounds are normal. He exhibits no distension. There is no tenderness.  Lymphadenopathy:    He has no cervical adenopathy.  Neurological: He is alert and oriented to person, place, and  time. He has normal reflexes.  Skin: Skin is warm and dry.  Psychiatric: He has a normal mood and affect. His behavior is normal. Judgment and thought content normal.    No results found for: HGBA1C  Lab Results  Component Value Date   WBC 11.1* 03/30/2013   HGB 15.2 08/15/2013   HCT 44.5 08/15/2013   PLT 266 09/11/2010   GLUCOSE 95 08/15/2013   CHOL 130 03/30/2013   TRIG 139 03/30/2013   HDL 41 03/30/2013   LDLCALC 61 03/30/2013   ALT 31 03/30/2013   AST 55* 03/30/2013   NA 139 08/15/2013   K 4.4 08/15/2013   CL 103 08/15/2013   CREATININE 0.78 08/15/2013   BUN 8 08/15/2013   CO2 24 08/15/2013   TSH 3.360 03/30/2013   PSA <0.1 03/30/2013   INR 1.00 09/11/2010    No results found.  Assessment & Plan:   Lee Wang was seen today for hypertension, hyperlipidemia and gad.  Diagnoses and all orders for this visit:  Hyperlipemia Orders: -     CMP14+EGFR -     Lipid panel  Hypertension, essential Orders: -     POCT CBC -     CMP14+EGFR -     Lipid panel  BPH (benign prostatic hypertrophy) Orders: -     PSA, total and free  Loss of weight Orders: -     Thyroid Panel With TSH  Other fatigue Orders: -     Thyroid Panel With TSH -     Vit D  25 hydroxy (rtn osteoporosis monitoring)  Other orders -     ALPRAZolam (XANAX) 0.5 MG tablet; Take 1 tablet (0.5 mg total) by mouth 2 (two) times daily as needed. for anxiety -     atorvastatin (LIPITOR) 40 MG tablet; Take 1 tablet (40 mg total) by mouth daily. -     fenofibrate (TRICOR) 145 MG tablet; Take 1 tablet (145 mg total) by mouth daily. -     propranolol (INDERAL) 20 MG tablet; Take 1 tablet (20 mg total) by mouth 3 (three) times daily. -     venlafaxine XR (EFFEXOR-XR) 150 MG 24 hr capsule; Take 1 capsule (150 mg total) by mouth daily.   I have changed Lee Wang's ALPRAZolam. I am also having him maintain his atorvastatin, fenofibrate, propranolol, and venlafaxine XR.  Meds ordered this encounter    Medications  . ALPRAZolam (XANAX) 0.5 MG tablet    Sig: Take 1 tablet (0.5 mg total) by mouth 2 (two) times daily as needed. for anxiety    Dispense:  60 tablet    Refill:  5  . atorvastatin (LIPITOR) 40 MG tablet    Sig: Take 1 tablet (40 mg total) by mouth daily.    Dispense:  30 tablet    Refill:  11  . fenofibrate (TRICOR) 145 MG tablet    Sig: Take 1 tablet (145 mg total) by mouth daily.    Dispense:  30 tablet    Refill:  11  . propranolol (INDERAL) 20 MG tablet    Sig: Take 1 tablet (20 mg total) by mouth 3 (three) times daily.    Dispense:  90 tablet    Refill:  11  . venlafaxine XR (EFFEXOR-XR) 150 MG 24 hr capsule  Sig: Take 1 capsule (150 mg total) by mouth daily.    Dispense:  30 capsule    Refill:  11     Follow-up: Return in about 6 months (around 11/09/2014).  Claretta Fraise, M.D.

## 2014-09-04 ENCOUNTER — Telehealth: Payer: Self-pay | Admitting: Family Medicine

## 2014-09-04 NOTE — Telephone Encounter (Signed)
No early refills can be made of lost controlled substance meds

## 2014-09-11 ENCOUNTER — Telehealth: Payer: Self-pay | Admitting: Nurse Practitioner

## 2014-09-11 NOTE — Telephone Encounter (Signed)
Spoke with patient and he stated that he has blood in his urine and would like to be seen. Appt given for tomorrow afternoon per patients request

## 2014-09-12 ENCOUNTER — Encounter: Payer: Self-pay | Admitting: Family

## 2014-09-12 ENCOUNTER — Ambulatory Visit (INDEPENDENT_AMBULATORY_CARE_PROVIDER_SITE_OTHER): Payer: Medicare Other | Admitting: Family

## 2014-09-12 VITALS — BP 120/74 | HR 71 | Temp 97.6°F | Ht 66.0 in | Wt 146.8 lb

## 2014-09-12 DIAGNOSIS — R58 Hemorrhage, not elsewhere classified: Secondary | ICD-10-CM | POA: Diagnosis not present

## 2014-09-12 DIAGNOSIS — N400 Enlarged prostate without lower urinary tract symptoms: Secondary | ICD-10-CM

## 2014-09-12 DIAGNOSIS — R319 Hematuria, unspecified: Secondary | ICD-10-CM | POA: Diagnosis not present

## 2014-09-12 DIAGNOSIS — M545 Low back pain: Secondary | ICD-10-CM | POA: Diagnosis not present

## 2014-09-12 DIAGNOSIS — C61 Malignant neoplasm of prostate: Secondary | ICD-10-CM

## 2014-09-12 LAB — POCT URINALYSIS DIPSTICK
Bilirubin, UA: NEGATIVE
Blood, UA: NEGATIVE
Glucose, UA: NEGATIVE
Ketones, UA: NEGATIVE
Leukocytes, UA: NEGATIVE
Nitrite, UA: NEGATIVE
PH UA: 6
Protein, UA: NEGATIVE
SPEC GRAV UA: 1.01
UROBILINOGEN UA: NEGATIVE

## 2014-09-12 LAB — POCT UA - MICROSCOPIC ONLY
BACTERIA, U MICROSCOPIC: NEGATIVE
CASTS, UR, LPF, POC: NEGATIVE
Crystals, Ur, HPF, POC: NEGATIVE
EPITHELIAL CELLS, URINE PER MICROSCOPY: NEGATIVE
Mucus, UA: NEGATIVE
RBC, urine, microscopic: NEGATIVE
WBC, UR, HPF, POC: NEGATIVE
Yeast, UA: NEGATIVE

## 2014-09-12 MED ORDER — TRAMADOL HCL 50 MG PO TABS: 50.0000 mg | ORAL_TABLET | Freq: Three times a day (TID) | ORAL | Status: DC | PRN

## 2014-09-12 NOTE — Patient Instructions (Signed)

## 2014-09-12 NOTE — Progress Notes (Signed)
Subjective:    Patient ID: Lee Wang, male    DOB: February 11, 1945, 68 y.o.   MRN: [id number]  Pt presents to the office today for hematuria and back pain. Pt is poor historian and his neighbor is present during visit. Pt's neighbor states she had to make him come today and states he has been complaining of flank pain for the last two weeks. PT states he was diagnosed with Prostate Cancer in 2012 and had radiation. Pt states he has never followed up and does not know if it is in remission.  Hematuria This is a new problem. The current episode started 1 to 4 weeks ago. The problem has been waxing and waning since onset. He describes the hematuria as gross hematuria. He reports no clotting in his urine stream. His pain is at a severity of 8/10. The pain is moderate. He describes his urine color as dark red. Irritative symptoms include urgency. Irritative symptoms do not include frequency or nocturia. Obstructive symptoms include an intermittent stream. Obstructive symptoms do not include straining. Associated symptoms include flank pain. Pertinent negatives include no abdominal pain, chills, dysuria, fever, nausea or vomiting. (Prostate cancer)      Review of Systems  Constitutional: Negative.  Negative for fever and chills.  HENT: Negative.   Respiratory: Negative.   Cardiovascular: Negative.   Gastrointestinal: Negative.  Negative for nausea, vomiting and abdominal pain.  Endocrine: Negative.   Genitourinary: Positive for urgency, hematuria and flank pain. Negative for dysuria, frequency and nocturia.  Musculoskeletal: Negative.   Neurological: Negative.   Hematological: Negative.   Psychiatric/Behavioral: Negative.   All other systems reviewed and are negative.      Objective:   Physical Exam  Constitutional: He is oriented to person, place, and time. He appears well-developed and well-nourished. No distress.  HENT:  Head: Normocephalic.  Right Ear: External ear normal.  Left  Ear: External ear normal.  Mouth/Throat: Oropharynx is clear and moist.  Hoarse voice   Eyes: Pupils are equal, round, and reactive to light. Right eye exhibits no discharge. Left eye exhibits no discharge.  Neck: Normal range of motion. Neck supple. No thyromegaly present.  Cardiovascular: Normal rate, regular rhythm, normal heart sounds and intact distal pulses.   No murmur heard. Pulmonary/Chest: Effort normal. No respiratory distress. He has wheezes.  Abdominal: Soft. Bowel sounds are normal. He exhibits no distension. There is no tenderness.  Musculoskeletal: Normal range of motion. He exhibits no edema or tenderness.  Neurological: He is alert and oriented to person, place, and time. He has normal reflexes. No cranial nerve deficit.  Skin: Skin is warm and dry. No rash noted. No erythema.  Scatter ecchymosis   Psychiatric: He has a normal mood and affect. His behavior is normal. Judgment and thought content normal.  Vitals reviewed.     BP 120/74 mmHg  Pulse 71  Temp(Src) 97.6 F (36.4 C) (Oral)  Ht _0  (1.676 m)  Wt 146 lb 12.8 oz (66.588 kg)  BMI 23.71 kg/m2     Assessment & Plan:  1. Hematuria - POCT urinalysis dipstick - POCT UA - Microscopic Only - Anemia Profile B - CMP14+EGFR  2. Ecchymosis - Anemia Profile B - CMP14+EGFR  3. Prostate ca - PSA, total and free - CMP14+EGFR  4. BPH (benign prostatic hyperplasia) - CMP14+EGFR  5. Bilateral low back pain, with sciatica presence unspecified - CMP14+EGFR - traMADol (ULTRAM) 50 MG tablet; Take 1 tablet (50 mg total) by mouth every 8 (eight)  hours as needed.  Dispense: 60 tablet; Refill: 0  PT is a very poor historian- Educated pt on importance of keeping follow ups and lab work (lab work still pending from last visit to his PCP) Labs pending- Pt has a history of elevated liver enzymes- We weill wait to see results Scattered ecchymosis- Pending Anemia Profile and CMP RTO prn and keep follow up with Dr.  Bernell List, FNP

## 2014-09-13 ENCOUNTER — Other Ambulatory Visit: Payer: Self-pay | Admitting: Family

## 2014-09-13 ENCOUNTER — Encounter: Payer: Self-pay | Admitting: Internal Medicine

## 2014-09-13 DIAGNOSIS — R748 Abnormal levels of other serum enzymes: Secondary | ICD-10-CM

## 2014-09-13 LAB — PSA, TOTAL AND FREE: PSA, Free: 0.01 ng/mL

## 2014-09-13 LAB — CMP14+EGFR
ALT: 71 IU/L — ABNORMAL HIGH (ref 0–44)
AST: 133 IU/L — AB (ref 0–40)
Albumin/Globulin Ratio: 1.9 (ref 1.1–2.5)
Albumin: 4.1 g/dL (ref 3.6–4.8)
Alkaline Phosphatase: 177 IU/L — ABNORMAL HIGH (ref 39–117)
BUN / CREAT RATIO: 7 — AB (ref 10–22)
BUN: 4 mg/dL — ABNORMAL LOW (ref 8–27)
Bilirubin Total: 0.8 mg/dL (ref 0.0–1.2)
CO2: 25 mmol/L (ref 18–29)
Calcium: 9.7 mg/dL (ref 8.6–10.2)
Chloride: 96 mmol/L — ABNORMAL LOW (ref 97–108)
Creatinine, Ser: 0.59 mg/dL — ABNORMAL LOW (ref 0.76–1.27)
GFR calc Af Amer: 119 mL/min/{1.73_m2} (ref >59)
GFR calc non Af Amer: 103 mL/min/{1.73_m2} (ref >59)
GLUCOSE: 86 mg/dL (ref 65–99)
Globulin, Total: 2.2 g/dL (ref 1.5–4.5)
Potassium: 4.6 mmol/L (ref 3.5–5.2)
SODIUM: 138 mmol/L (ref 134–144)
TOTAL PROTEIN: 6.3 g/dL (ref 6.0–8.5)

## 2014-09-13 LAB — ANEMIA PROFILE B
Basophils Absolute: 0.1 10*3/uL (ref 0.0–0.2)
Basos: 1 %
EOS (ABSOLUTE): 0.2 10*3/uL (ref 0.0–0.4)
Eos: 3 %
FOLATE: 9 ng/mL (ref >3.0)
Ferritin: 148 ng/mL (ref 30–400)
Hematocrit: 43.2 % (ref 37.5–51.0)
Hemoglobin: 14.7 g/dL (ref 12.6–17.7)
IMMATURE GRANS (ABS): 0 10*3/uL (ref 0.0–0.1)
IRON SATURATION: 47 % (ref 15–55)
Immature Granulocytes: 0 %
Iron: 142 ug/dL (ref 38–169)
LYMPHS ABS: 1.5 10*3/uL (ref 0.7–3.1)
LYMPHS: 26 %
MCH: 34 pg — AB (ref 26.6–33.0)
MCHC: 34 g/dL (ref 31.5–35.7)
MCV: 100 fL — AB (ref 79–97)
MONOS ABS: 0.6 10*3/uL (ref 0.1–0.9)
Monocytes: 10 %
NEUTROS ABS: 3.5 10*3/uL (ref 1.4–7.0)
Neutrophils: 60 %
PLATELETS: 186 10*3/uL (ref 150–379)
RBC: 4.32 x10E6/uL (ref 4.14–5.80)
RDW: 14 % (ref 12.3–15.4)
Retic Ct Pct: 0.6 % (ref 0.6–2.6)
Total Iron Binding Capacity: 299 ug/dL (ref 250–450)
UIBC: 157 ug/dL (ref 111–343)
VITAMIN B 12: 701 pg/mL (ref 211–946)
WBC: 5.9 10*3/uL (ref 3.4–10.8)

## 2014-09-13 NOTE — Progress Notes (Signed)
Patient aware.

## 2014-09-28 ENCOUNTER — Ambulatory Visit (INDEPENDENT_AMBULATORY_CARE_PROVIDER_SITE_OTHER): Payer: Medicare Other | Admitting: Physician Assistant

## 2014-09-28 ENCOUNTER — Telehealth: Payer: Self-pay | Admitting: Family Medicine

## 2014-09-28 VITALS — BP 147/85 | HR 66 | Temp 99.4°F | Ht 66.0 in | Wt 141.0 lb

## 2014-09-28 DIAGNOSIS — R319 Hematuria, unspecified: Secondary | ICD-10-CM | POA: Diagnosis not present

## 2014-09-28 DIAGNOSIS — R109 Unspecified abdominal pain: Secondary | ICD-10-CM | POA: Diagnosis not present

## 2014-09-28 DIAGNOSIS — R350 Frequency of micturition: Secondary | ICD-10-CM | POA: Diagnosis not present

## 2014-09-28 LAB — POCT URINALYSIS DIPSTICK
Bilirubin, UA: NEGATIVE
Blood, UA: NEGATIVE
Glucose, UA: NEGATIVE
Ketones, UA: NEGATIVE
LEUKOCYTES UA: NEGATIVE
NITRITE UA: NEGATIVE
PROTEIN UA: NEGATIVE
Spec Grav, UA: <=1.005
UROBILINOGEN UA: NEGATIVE
pH, UA: 7.5

## 2014-09-28 LAB — POCT UA - MICROSCOPIC ONLY
Bacteria, U Microscopic: NEGATIVE
Casts, Ur, LPF, POC: NEGATIVE
Crystals, Ur, HPF, POC: NEGATIVE
Mucus, UA: NEGATIVE
RBC, URINE, MICROSCOPIC: NEGATIVE
WBC, UR, HPF, POC: NEGATIVE
YEAST UA: NEGATIVE

## 2014-09-28 MED ORDER — TRAMADOL HCL 50 MG PO TABS: 100.0000 mg | ORAL_TABLET | Freq: Four times a day (QID) | ORAL | Status: DC | PRN

## 2014-09-28 NOTE — Telephone Encounter (Signed)
Patient has continued back and flank pain. He is scheduled to see GI at the end of the month for elevated liver enzymes. Given Tramadol at last visit to take 1 every 8 hrs. He was taking 2 every 6 and pain only improved minimally. He was sleeping well and his caregiver has given him some Tylenol PM. Asked her not to have him take any Tylenol products because of his liver. Patient scheduled to come in this evening for reevaluation.  Caregiver stated understanding and agreement to plan.

## 2014-09-28 NOTE — Progress Notes (Signed)
   Subjective:    Patient ID: Lee Wang, male    DOB: Dec 12, 1945, 69 y.o.   MRN: [id number]  HPI  69 y/o male presents for c/o right side low back pain. He was seen on 09/12/14 for hematuria. He presents with his neighbor today who states that he is not at his "normal" level and the tramadol has not been able to control his pain.    Review of Systems  Constitutional: Positive for fatigue and unexpected weight change.  HENT: Negative.   Eyes: Negative.   Cardiovascular: Negative.   Gastrointestinal: Positive for abdominal pain (RUQ). Negative for nausea, vomiting, diarrhea, constipation and blood in stool.  Endocrine: Negative.   Genitourinary: Positive for hematuria (occasional ), flank pain (right ) and difficulty urinating.  Musculoskeletal: Positive for back pain.       Objective:   Physical Exam  Constitutional:  Malnourished 50# weight loss in 1 year according to charts  Pulmonary/Chest: Effort normal and breath sounds normal. No respiratory distress.  Abdominal: Soft. He exhibits no distension and no mass. There is tenderness (right flank, RUQ). There is guarding.  Psychiatric: He has a normal mood and affect. His behavior is normal. Judgment and thought content normal.  Vitals reviewed.         Assessment & Plan:  1. Flank pain  - POCT UA - Microscopic Only - POCT urinalysis dipstick - Urine culture - traMADol (ULTRAM) 50 MG tablet; Take 2 tablets (100 mg total) by mouth every 6 (six) hours as needed for moderate pain.  Dispense: 90 tablet; Refill: 0  2. Blood in urine  - POCT UA - Microscopic Only - POCT urinalysis dipstick - Urine culture - traMADol (ULTRAM) 50 MG tablet; Take 2 tablets (100 mg total) by mouth every 6 (six) hours as needed for moderate pain.  Dispense: 90 tablet; Refill: 0  3. Urinary frequency  - POCT UA - Microscopic Only - POCT urinalysis dipstick - Urine culture - traMADol (ULTRAM) 50 MG tablet; Take 2 tablets (100 mg total) by  mouth every 6 (six) hours as needed for moderate pain.  Dispense: 90 tablet; Refill: 0  4. Right flank pain  - traMADol (ULTRAM) 50 MG tablet; Take 2 tablets (100 mg total) by mouth every 6 (six) hours as needed for moderate pain.  Dispense: 90 tablet; Refill: 0  If pain worsens, go to ED Keep appt with Gertie Fey on Oct 09, 2014   Marabella Popiel A. Benjamin Stain PA-C

## 2014-09-29 ENCOUNTER — Telehealth: Payer: Self-pay | Admitting: *Deleted

## 2014-09-29 NOTE — Telephone Encounter (Signed)
Left detailed message with normal lab results and to call back with any questions or concerns 

## 2014-10-01 ENCOUNTER — Other Ambulatory Visit: Payer: Self-pay | Admitting: Physician Assistant

## 2014-10-01 DIAGNOSIS — N3090 Cystitis, unspecified without hematuria: Secondary | ICD-10-CM

## 2014-10-01 LAB — URINE CULTURE

## 2014-10-01 MED ORDER — CIPROFLOXACIN HCL 500 MG PO TABS: 500.0000 mg | ORAL_TABLET | Freq: Two times a day (BID) | ORAL | Status: DC

## 2014-10-02 NOTE — Progress Notes (Signed)
Verbal understanding Pt notified

## 2014-10-09 ENCOUNTER — Telehealth: Payer: Self-pay | Admitting: Gastroenterology

## 2014-10-09 ENCOUNTER — Encounter: Payer: Self-pay | Admitting: Gastroenterology

## 2014-10-09 ENCOUNTER — Ambulatory Visit: Payer: Self-pay | Admitting: Gastroenterology

## 2014-10-09 NOTE — Telephone Encounter (Signed)
PATIENT WAS A NO SHOW AND LETTER SENT  °

## 2014-10-22 ENCOUNTER — Ambulatory Visit: Payer: Self-pay | Admitting: Nurse Practitioner

## 2014-10-31 ENCOUNTER — Other Ambulatory Visit: Payer: Self-pay | Admitting: Family Medicine

## 2014-11-01 ENCOUNTER — Other Ambulatory Visit: Payer: Self-pay | Admitting: Physician Assistant

## 2014-11-01 NOTE — Telephone Encounter (Signed)
Last seen 09/28/14  Lee Wang  If approved route to nurse to call into [facility]

## 2014-11-02 NOTE — Telephone Encounter (Signed)
This was refilled yesterday and he has an appt scheduled for 11/09/14 with Dr Livia Snellen.

## 2014-11-02 NOTE — Telephone Encounter (Signed)
Tell the patient I would be happy to see him if he will make an appointment to refill this medicine.

## 2014-11-02 NOTE — Telephone Encounter (Signed)
Left refill authorization on [facility].

## 2014-11-05 ENCOUNTER — Ambulatory Visit (INDEPENDENT_AMBULATORY_CARE_PROVIDER_SITE_OTHER): Payer: Medicare Other | Admitting: Nurse Practitioner

## 2014-11-05 ENCOUNTER — Other Ambulatory Visit: Payer: Self-pay

## 2014-11-05 ENCOUNTER — Encounter: Payer: Self-pay | Admitting: Nurse Practitioner

## 2014-11-05 VITALS — BP 149/84 | HR 64 | Temp 97.6°F | Ht 67.0 in | Wt 137.0 lb

## 2014-11-05 DIAGNOSIS — R799 Abnormal finding of blood chemistry, unspecified: Secondary | ICD-10-CM | POA: Diagnosis not present

## 2014-11-05 DIAGNOSIS — Z86010 Personal history of colonic polyps: Secondary | ICD-10-CM

## 2014-11-05 DIAGNOSIS — R160 Hepatomegaly, not elsewhere classified: Secondary | ICD-10-CM

## 2014-11-05 DIAGNOSIS — F1010 Alcohol abuse, uncomplicated: Secondary | ICD-10-CM | POA: Diagnosis not present

## 2014-11-05 DIAGNOSIS — R7989 Other specified abnormal findings of blood chemistry: Secondary | ICD-10-CM | POA: Diagnosis not present

## 2014-11-05 DIAGNOSIS — R935 Abnormal findings on diagnostic imaging of other abdominal regions, including retroperitoneum: Secondary | ICD-10-CM | POA: Insufficient documentation

## 2014-11-05 DIAGNOSIS — R945 Abnormal results of liver function studies: Secondary | ICD-10-CM

## 2014-11-05 LAB — COMPREHENSIVE METABOLIC PANEL
ALT: 24 U/L (ref 9–46)
AST: 39 U/L — ABNORMAL HIGH (ref 10–35)
Albumin: 3.9 g/dL (ref 3.6–5.1)
Alkaline Phosphatase: 125 U/L — ABNORMAL HIGH (ref 40–115)
BUN: 6 mg/dL — ABNORMAL LOW (ref 7–25)
CO2: 29 mmol/L (ref 20–31)
Calcium: 9.6 mg/dL (ref 8.6–10.3)
Chloride: 102 mmol/L (ref 98–110)
Creat: 0.57 mg/dL — ABNORMAL LOW (ref 0.70–1.25)
Glucose, Bld: 78 mg/dL (ref 65–99)
Potassium: 4 mmol/L (ref 3.5–5.3)
Sodium: 138 mmol/L (ref 135–146)
Total Bilirubin: 2 mg/dL — ABNORMAL HIGH (ref 0.2–1.2)
Total Protein: 6.4 g/dL (ref 6.1–8.1)

## 2014-11-05 LAB — CBC WITH DIFFERENTIAL/PLATELET
Basophils Absolute: 0.1 10*3/uL (ref 0.0–0.1)
Basophils Relative: 1 % (ref 0–1)
Eosinophils Absolute: 0.4 10*3/uL (ref 0.0–0.7)
Eosinophils Relative: 6 % — ABNORMAL HIGH (ref 0–5)
HCT: 43.5 % (ref 39.0–52.0)
HEMOGLOBIN: 15.3 g/dL (ref 13.0–17.0)
LYMPHS ABS: 2 10*3/uL (ref 0.7–4.0)
LYMPHS PCT: 29 % (ref 12–46)
MCH: 33.6 pg (ref 26.0–34.0)
MCHC: 35.2 g/dL (ref 30.0–36.0)
MCV: 95.4 fL (ref 78.0–100.0)
MONO ABS: 0.7 10*3/uL (ref 0.1–1.0)
MONOS PCT: 10 % (ref 3–12)
MPV: 10.2 fL (ref 8.6–12.4)
Neutro Abs: 3.7 10*3/uL (ref 1.7–7.7)
Neutrophils Relative %: 54 % (ref 43–77)
Platelets: 232 10*3/uL (ref 150–400)
RBC: 4.56 MIL/uL (ref 4.22–5.81)
RDW: 13.5 % (ref 11.5–15.5)
WBC: 6.8 10*3/uL (ref 4.0–10.5)

## 2014-11-05 MED ORDER — PEG 3350-KCL-NA BICARB-NACL 420 G PO SOLR: 4000.0000 mL | ORAL | Status: DC

## 2014-11-05 NOTE — Progress Notes (Signed)
CC'D TO PCP °

## 2014-11-05 NOTE — Assessment & Plan Note (Signed)
Last colonoscopy 2011 with polypectomy of descending colon, splenic flexure, and sigmoid colon. Surgical pathology showed all polyps were tubular adenoma. He is recommended for 3 year repeat colonoscopy at this point is overdue. In addition to his evaluation for his liver function test abnormalities we will also schedule him for colonoscopy today.  Proceed with TCS in the OR with propofol/MAC with Dr. Gala Romney in near future: the risks, benefits, and alternatives have been discussed with the patient in detail. The patient states understanding and desires to proceed.  The patient is not on any anticoagulants. He is on Xanax, Effexor, and potentially Ultram. He also has a history of chronic alcohol abuse. For these reasons we will schedule his procedure and the OR with propofol/MAC to promote adequate sedation.

## 2014-11-05 NOTE — Assessment & Plan Note (Signed)
Patient with a history of alcohol abuse. States he generally drinks at least once a week but will have at least 6 drinks per occasion. He has been doing this for a prolonged period of time. Given his elevated liver function tests, hepatomegaly on ultrasound and exam, and possibility of liver disease recommend he cut back and eventually abstain from alcohol. He verbalized understanding.

## 2014-11-05 NOTE — Patient Instructions (Signed)
1. Have your labs drawn as soon as her able. 2. We will have the schedule the ultrasound. 3. As we discussed, cut back and eventually abstain from alcohol. 4. Return for follow-up in 3 months. 5. We will schedule your colonoscopy for you. 6. Further recommendations to be based on results your colonoscopy.

## 2014-11-05 NOTE — Progress Notes (Signed)
PA# for TCS- [id number]

## 2014-11-05 NOTE — Assessment & Plan Note (Signed)
Patient with hepatomegaly noted on right upper quadrant abdominal ultrasound on 04/11/2013. Again appreciated on physical exam today. We'll workup for liver disease as noted below.

## 2014-11-05 NOTE — Progress Notes (Addendum)
Primary Care Physician:  Marline Backbone, PA-C Primary Gastroenterologist:  Dr. Gala Romney  Chief Complaint  Patient presents with  . Elevated Hepatic Enzymes    HPI:   69 year old male presents on referral from PCP for elevated liver enzymes, flank pain, fatigue, weight loss. PCP notes reviewed. Patient visit with his PCP on 09/12/2014 for hematuria and back pain/flank pain for 2 weeks prior to office visit. Stated history of elevated liver enzymes which are checked at that office visit and found to be elevated. AST/ALT at 133/71, alkaline phosphatase at 177. CBC checked which showed normal hemoglobin and hematocrit of 14.7/43.2, normal platelets at 186. Total iron-binding capacity, iron, iron saturation, ferritin, folate, vitamin B12 all normal. Urine testing demonstrated urinary tract infection in treated with antibiotics. Ultrasound of the right upper quadrant abdomen on 04/11/2013 found liver enlarged with an homogenous echotexture and subtly nodular contour, question underlying parenchymal disease such as potential cirrhosis. No focal liver lesions identified. Last colonoscopy 08/15/2009 with recommended repeat in 3 years. At this point is overdue for colonoscopy.  Today he states he has not had elevated liver enzymes in the past. Has bilateral flank pain which is not improving much after antibiotics. Has chronic pain. Is not taking Tramadol because "it's not helping him." Has had a decrease in energy, states he has a good appetite, but neighbor says "not really." Denies unexplainable fever, chills, unintentional weight loss. Denies hematochezia, melena, change in bowel habits. Denies abdominal pain, N/V, yellowing of skin/eyes, darkened urine. Denies chest pain, dyspnea, dizziness, lightheadedness, syncope, near syncope. Denies any other upper or lower GI symptoms.  Past Medical History  Diagnosis Date  . Hyperlipidemia   . Hypertension   . Anxiety   . Depression   . Cancer prostate     Past Surgical History  Procedure Laterality Date  . Prostate surgery      radiation implants  . Fracture surgery Bilateral     ankle  . Cataract extraction w/phaco Right 08/21/2013    Procedure: CATARACT EXTRACTION PHACO AND INTRAOCULAR LENS PLACEMENT (IOC);  Surgeon: Tonny Branch, MD;  Location: [facility];  Service: Ophthalmology;  Laterality: Right;  CDE 7.34  . Cataract extraction w/phaco Left 09/14/2013    Procedure: CATARACT EXTRACTION PHACO AND INTRAOCULAR LENS PLACEMENT (IOC);  Surgeon: Tonny Branch, MD;  Location: [facility];  Service: Ophthalmology;  Laterality: Left;  CDE: 11.50    Current Outpatient Prescriptions  Medication Sig Dispense Refill  . ALPRAZolam (XANAX) 0.5 MG tablet TAKE ONE TABLET BY MOUTH TWICE DAILY AS NEEDED FOR ANXIETY 60 tablet 0  . atorvastatin (LIPITOR) 40 MG tablet Take 1 tablet (40 mg total) by mouth daily. 30 tablet 11  . propranolol (INDERAL) 20 MG tablet Take 1 tablet (20 mg total) by mouth 3 (three) times daily. 90 tablet 11  . venlafaxine XR (EFFEXOR-XR) 150 MG 24 hr capsule Take 1 capsule (150 mg total) by mouth daily. 30 capsule 11  . ciprofloxacin (CIPRO) 500 MG tablet Take 1 tablet (500 mg total) by mouth 2 (two) times daily. (Patient not taking: Reported on 11/05/2014) 20 tablet 0  . fenofibrate (TRICOR) 145 MG tablet Take 1 tablet (145 mg total) by mouth daily. (Patient not taking: Reported on 11/05/2014) 30 tablet 11  . traMADol (ULTRAM) 50 MG tablet Take 2 tablets (100 mg total) by mouth every 6 (six) hours as needed for moderate pain. (Patient not taking: Reported on 11/05/2014) 90 tablet 0   No current facility-administered medications for this visit.  Allergies as of 11/05/2014  . (No Known Allergies)    Family History  Problem Relation Age of Onset  . Alzheimer's disease Mother   . Heart disease Father   . Alzheimer's disease Paternal Grandfather     Social History   Social History  . Marital Status: Divorced    Spouse Name: N/A  .  Number of Children: N/A  . Years of Education: N/A   Occupational History  . Not on file.   Social History Main Topics  . Smoking status: Current Every Day Smoker -- 1.00 packs/day for 50 years    Types: Cigarettes    Start date: 08/09/1958  . Smokeless tobacco: Not on file  . Alcohol Use: 5.0 oz/week    10 drink(s) per week  . Drug Use: No  . Sexual Activity: Not on file   Other Topics Concern  . Not on file   Social History Narrative    Review of Systems: General: Negative for anorexia, weight loss, fever, chills, fatigue, weakness. Eyes: Negative for vision changes.  ENT: Negative for hoarseness, difficulty swallowing. CV: Negative for chest pain, angina, palpitations, peripheral edema.  Respiratory: Negative for dyspnea at rest, cough, sputum, wheezing.  GI: See history of present illness. Derm: Negative for rash or itching.   Endo: Negative for unusual weight change.  Heme: Negative for bruising or bleeding. Allergy: Negative for rash or hives.    Physical Exam: BP 149/84 mmHg  Pulse 64  Temp(Src) 97.6 F (36.4 C) (Oral)  Ht 5\' 7"  (1.702 m)  Wt 137 lb (62.143 kg)  BMI 21.45 kg/m2 General:   Alert and oriented. Pleasant and cooperative. Well-nourished and well-developed.  Head:  Normocephalic and atraumatic. Eyes:  Without icterus, sclera clear and conjunctiva pink.  Ears:  Normal auditory acuity. Cardiovascular:  S1, S2 present without murmurs appreciated. Normal pulses noted. Extremities without clubbing or edema. Respiratory:  Clear to auscultation bilaterally. No wheezes, rales, or rhonchi. No distress.  Gastrointestinal:  +BS, soft, non-tender and non-distended. Liver border appreciated about 3 fingetbreadths below the right costal margin. No guarding or rebound. No masses appreciated.  Rectal:  Deferred  Neurologic:  Alert and oriented x4;  grossly normal neurologically. Psych:  Alert and cooperative. Normal mood and affect.    11/05/2014 10:46  AM

## 2014-11-05 NOTE — Assessment & Plan Note (Signed)
Issue with a history of liver enzyme elevation as well as chronic alcohol use. He has not been worked up for other etiologies of potential liver disease. Ultrasound 04/11/2013 shows hepatomegaly and homogenous echotexture was subtly nodular contour questionable underlying potential cirrhosis. This point we will check CBC, CMP, hepatitis C, antimicrosomal antibody, antinuclear antibody, and anti-smooth muscle antibody. We will also schedule him for an ultrasound elastography to evaluate for fibrosis. Advised to stop or at least cut back on alcohol. Return for follow-up in 3 months.

## 2014-11-06 LAB — ANTI-SMOOTH MUSCLE ANTIBODY, IGG: Smooth Muscle Ab: 10 U (ref <20)

## 2014-11-06 LAB — ANA: Anti Nuclear Antibody(ANA): NEGATIVE

## 2014-11-06 NOTE — Progress Notes (Deleted)
Pt has an appointment with Dr. Wagner on 11/08/14 @ 10:45. Pt is aware of appointment address([address]) and phone number([phone]).  

## 2014-11-07 LAB — ANTI-MICROSOMAL ANTIBODY LIVER / KIDNEY: LKM1 Ab: <=20 U (ref <=20.0)

## 2014-11-09 ENCOUNTER — Ambulatory Visit: Payer: Medicare Other | Admitting: Family Medicine

## 2014-11-09 ENCOUNTER — Ambulatory Visit: Payer: Medicare Other | Admitting: Urology

## 2014-11-14 ENCOUNTER — Ambulatory Visit (HOSPITAL_COMMUNITY): Admission: RE | Admit: 2014-11-14 | Payer: Medicare Other | Source: Ambulatory Visit

## 2014-11-19 ENCOUNTER — Ambulatory Visit: Payer: Medicare Other | Admitting: Family Medicine

## 2014-11-20 ENCOUNTER — Encounter: Payer: Self-pay | Admitting: Physician Assistant

## 2014-11-22 NOTE — Patient Instructions (Signed)
Lee Wang  11/22/2014     @PREFPERIOPPHARMACY @   Your procedure is scheduled on  11/29/2014   Report to [name] at  2  A.M.  Call this number if you have problems the morning of surgery:  [phone]   Remember:  Do not eat food or drink liquids after midnight.  Take these medicines the morning of surgery with A SIP OF WATER  Xanax, propranolol, effexor.   Do not wear jewelry, make-up or nail polish.  Do not wear lotions, powders, or perfumes.  You may wear deodorant.  Do not shave 48 hours prior to surgery.  Men may shave face and neck.  Do not bring valuables to the hospital.  [facility] is not responsible for any belongings or valuables.  Contacts, dentures or bridgework may not be worn into surgery.  Leave your suitcase in the car.  After surgery it may be brought to your room.  For patients admitted to the hospital, discharge time will be determined by your treatment team.  Patients discharged the day of surgery will not be allowed to drive home.   Name and phone number of your driver:   Family. Special instructions:  Follow the diet and prep instructions given to you by Dr Roseanne Kaufman office.  Please read over the following fact sheets that you were given. Pain Booklet, Coughing and Deep Breathing, Surgical Site Infection Prevention, Anesthesia Post-op Instructions and Care and Recovery After Surgery      Colonoscopy A colonoscopy is an exam to look at the entire large intestine (colon). This exam can help find problems such as tumors, polyps, inflammation, and areas of bleeding. The exam takes about 1 hour.  LET [facility] CARE PROVIDER KNOW ABOUT:   Any allergies you have.  All medicines you are taking, including vitamins, herbs, eye drops, creams, and over-the-counter medicines.  Previous problems you or members of your family have had with the use of anesthetics.  Any blood disorders you have.  Previous surgeries you have had.  Medical  conditions you have. RISKS AND COMPLICATIONS  Generally, this is a safe procedure. However, as with any procedure, complications can occur. Possible complications include:  Bleeding.  Tearing or rupture of the colon wall.  Reaction to medicines given during the exam.  Infection (rare). BEFORE THE PROCEDURE   Ask your health care provider about changing or stopping your regular medicines.  You may be prescribed an oral bowel prep. This involves drinking a large amount of medicated liquid, starting the day before your procedure. The liquid will cause you to have multiple loose stools until your stool is almost clear or light green. This cleans out your colon in preparation for the procedure.  Do not eat or drink anything else once you have started the bowel prep, unless your health care provider tells you it is safe to do so.  Arrange for someone to drive you home after the procedure. PROCEDURE   You will be given medicine to help you relax (sedative).  You will lie on your side with your knees bent.  A long, flexible tube with a light and camera on the end (colonoscope) will be inserted through the rectum and into the colon. The camera sends video back to a computer screen as it moves through the colon. The colonoscope also releases carbon dioxide gas to inflate the colon. This helps your health care provider see the area better.  During the exam, your health care  provider may take a small tissue sample (biopsy) to be examined under a microscope if any abnormalities are found.  The exam is finished when the entire colon has been viewed. AFTER THE PROCEDURE   Do not drive for 24 hours after the exam.  You may have a small amount of blood in your stool.  You may pass moderate amounts of gas and have mild abdominal cramping or bloating. This is caused by the gas used to inflate your colon during the exam.  Ask when your test results will be ready and how you will get your results.  Make sure you get your test results.   This information is not intended to replace advice given to you by your health care provider. Make sure you discuss any questions you have with your health care provider.   Document Released: 01/24/2000 Document Revised: 11/16/2012 Document Reviewed: 10/03/2012 Elsevier Interactive Patient Education 2016 [employer] PATIENT INSTRUCTIONS POST-ANESTHESIA  IMMEDIATELY FOLLOWING SURGERY:  Do not drive or operate machinery for the first twenty four hours after surgery.  Do not make any important decisions for twenty four hours after surgery or while taking narcotic pain medications or sedatives.  If you develop intractable nausea and vomiting or a severe headache please notify your doctor immediately.  FOLLOW-UP:  Please make an appointment with your surgeon as instructed. You do not need to follow up with anesthesia unless specifically instructed to do so.  WOUND CARE INSTRUCTIONS (if applicable):  Keep a dry clean dressing on the anesthesia/puncture wound site if there is drainage.  Once the wound has quit draining you may leave it open to air.  Generally you should leave the bandage intact for twenty four hours unless there is drainage.  If the epidural site drains for more than 36-48 hours please call the anesthesia department.  QUESTIONS?:  Please feel free to call your physician or the hospital operator if you have any questions, and they will be happy to assist you.

## 2014-11-26 ENCOUNTER — Telehealth: Payer: Self-pay | Admitting: Internal Medicine

## 2014-11-26 ENCOUNTER — Encounter (HOSPITAL_COMMUNITY)
Admission: RE | Admit: 2014-11-26 | Discharge: 2014-11-26 | Disposition: A | Discharge status: Home / Self Care | Payer: Medicare Other | Source: Ambulatory Visit | Attending: Internal Medicine | Admitting: Internal Medicine

## 2014-11-26 NOTE — Telephone Encounter (Signed)
Nurse Maudie Mercury) called to let us know that patient did not show for his pre op appointment this morning and could not reach him by phone or leave a message.

## 2014-11-27 NOTE — Telephone Encounter (Signed)
Called pt x 2 LMOM to call office back

## 2014-11-28 ENCOUNTER — Telehealth: Payer: Self-pay | Admitting: Physician Assistant

## 2014-11-28 NOTE — Telephone Encounter (Signed)
Patient never called endo or office. No show for pre-op. Procedure has been cancelled

## 2014-11-29 ENCOUNTER — Ambulatory Visit (HOSPITAL_COMMUNITY): Admission: RE | Admit: 2014-11-29 | Payer: Medicare Other | Source: Ambulatory Visit | Admitting: Internal Medicine

## 2014-11-29 ENCOUNTER — Encounter (HOSPITAL_COMMUNITY): Admission: RE | Payer: Self-pay | Source: Ambulatory Visit

## 2014-11-29 SURGERY — COLONOSCOPY WITH PROPOFOL
Anesthesia: Monitor Anesthesia Care

## 2014-12-03 ENCOUNTER — Telehealth: Payer: Self-pay | Admitting: Family Medicine

## 2014-12-03 NOTE — Telephone Encounter (Signed)
Due for xanax refill. Please advise.

## 2014-12-04 NOTE — Telephone Encounter (Signed)
I will be happy to see him for this. Last visit >6 mos ago with me.

## 2014-12-04 NOTE — Telephone Encounter (Signed)
Pt notified NTBS Has appt on 12/05/2014

## 2014-12-05 ENCOUNTER — Ambulatory Visit (INDEPENDENT_AMBULATORY_CARE_PROVIDER_SITE_OTHER): Payer: Medicare Other | Admitting: Family Medicine

## 2014-12-05 ENCOUNTER — Encounter: Payer: Self-pay | Admitting: Family Medicine

## 2014-12-05 ENCOUNTER — Ambulatory Visit: Payer: Medicare Other | Admitting: Family Medicine

## 2014-12-05 VITALS — BP 142/90 | HR 90 | Temp 98.7°F | Ht 67.0 in | Wt 139.0 lb

## 2014-12-05 DIAGNOSIS — F418 Other specified anxiety disorders: Secondary | ICD-10-CM

## 2014-12-05 DIAGNOSIS — R410 Disorientation, unspecified: Secondary | ICD-10-CM | POA: Diagnosis not present

## 2014-12-05 DIAGNOSIS — F1020 Alcohol dependence, uncomplicated: Secondary | ICD-10-CM | POA: Diagnosis not present

## 2014-12-05 MED ORDER — ALPRAZOLAM 0.5 MG PO TABS: 0.5000 mg | ORAL_TABLET | Freq: Every day | ORAL | Status: DC | PRN

## 2014-12-05 NOTE — Assessment & Plan Note (Signed)
Because of alcohol use will start to wean off Xanax. We'll have back in a month to reassess.

## 2014-12-05 NOTE — Progress Notes (Signed)
BP 142/90 mmHg  Pulse 90  Temp(Src) 98.7 F (37.1 C) (Oral)  Ht 5' 7"  (1.702 m)  Wt 139 lb (63.05 kg)  BMI 21.77 kg/m2   Subjective:    Patient ID: Lee Wang, male    DOB: 1945-04-23, 69 y.o.   MRN: [id number]  HPI: Lee Wang is a 69 y.o. male presenting on 12/05/2014 for Medication Refill   HPI Depression and anxiety Patient presents today for refill on medication for depression and anxiety. He admits that he is not taking the Effexor and has not been taking it for quite some time. He wants a refill on his Xanax and says the rest of his medications are filled. He denies any suicidal ideation. When asked about his medication and how frequently he takes them he does have some confusion with his medications. Per his friend who is a neighbor here with him today she admits that she has noticed some confusion as well and she regularly sets out his medications but sometimes can't make it every week. He denies any suicidal ideation. He does admit that he drinks alcohol about 5-6 beers per day.  Alcoholism Patient admits to drinking alcohol on a rate of about 5-6 beers per day. He has also recently seen gastroenterology for liver issues. There is some confusion that is noted.  Relevant past medical, surgical, family and social history reviewed and updated as indicated. Interim medical history since our last visit reviewed. Allergies and medications reviewed and updated.  Review of Systems  Constitutional: Negative for fever and chills.  HENT: Negative for ear discharge and ear pain.   Eyes: Negative for discharge and visual disturbance.  Respiratory: Negative for shortness of breath and wheezing.   Cardiovascular: Negative for chest pain and leg swelling.  Gastrointestinal: Negative for abdominal pain, diarrhea and constipation.  Genitourinary: Negative for difficulty urinating.  Musculoskeletal: Negative for back pain and gait problem.  Skin: Negative for rash.    Neurological: Negative for dizziness, syncope, light-headedness and headaches.  Psychiatric/Behavioral: Positive for sleep disturbance. Negative for suicidal ideas, behavioral problems, self-injury, dysphoric mood and agitation. The patient is nervous/anxious.   All other systems reviewed and are negative.   Per HPI unless specifically indicated above     Medication List       This list is accurate as of: 12/05/14  2:59 PM.  Always use your most recent med list.               ALPRAZolam 0.5 MG tablet  Commonly known as:  XANAX  Take 1 tablet (0.5 mg total) by mouth daily as needed. for anxiety     atorvastatin 40 MG tablet  Commonly known as:  LIPITOR  Take 1 tablet (40 mg total) by mouth daily.     fenofibrate 145 MG tablet  Commonly known as:  TRICOR  Take 1 tablet (145 mg total) by mouth daily.     Fish Oil Oil  Take 1 capsule by mouth daily.     polyethylene glycol-electrolytes 420 G solution  Commonly known as:  TRILYTE  Take 4,000 mLs by mouth as directed.     propranolol 20 MG tablet  Commonly known as:  INDERAL  Take 1 tablet (20 mg total) by mouth 3 (three) times daily.           Objective:    BP 142/90 mmHg  Pulse 90  Temp(Src) 98.7 F (37.1 C) (Oral)  Ht 5' 7"  (1.702 m)  Wt 139 lb (  63.05 kg)  BMI 21.77 kg/m2  Wt Readings from Last 3 Encounters:  12/05/14 139 lb (63.05 kg)  11/05/14 137 lb (62.143 kg)  09/28/14 141 lb (63.957 kg)    Physical Exam  Constitutional: He is oriented to person, place, and time. He appears well-developed and well-nourished. No distress.  Eyes: Conjunctivae and EOM are normal. Pupils are equal, round, and reactive to light. Right eye exhibits no discharge. No scleral icterus.  Cardiovascular: Normal rate, regular rhythm, normal heart sounds and intact distal pulses.   No murmur heard. Pulmonary/Chest: Effort normal and breath sounds normal. No respiratory distress. He has no wheezes.  Abdominal: He exhibits no  distension.  Musculoskeletal: Normal range of motion. He exhibits no edema.  Neurological: He is alert and oriented to person, place, and time. Coordination normal.  Skin: Skin is warm and dry. No rash noted. He is not diaphoretic.  Psychiatric: His speech is normal and behavior is normal. Judgment normal. His mood appears not anxious. His affect is labile. His affect is not blunt. Cognition and memory are impaired (difficulty repeating medications in knowing what he takes when.). He does not exhibit a depressed mood. He expresses no suicidal ideation. He expresses no suicidal plans.  Nursing note and vitals reviewed.   Results for orders placed or performed in visit on 11/05/14  CBC with Differential/Platelet  Result Value Ref Range   WBC 6.8 4.0 - 10.5 K/uL   RBC 4.56 4.22 - 5.81 MIL/uL   Hemoglobin 15.3 13.0 - 17.0 g/dL   HCT 43.5 39.0 - 52.0 %   MCV 95.4 78.0 - 100.0 fL   MCH 33.6 26.0 - 34.0 pg   MCHC 35.2 30.0 - 36.0 g/dL   RDW 13.5 11.5 - 15.5 %   Platelets 232 150 - 400 K/uL   MPV 10.2 8.6 - 12.4 fL   Neutrophils Relative % 54 43 - 77 %   Neutro Abs 3.7 1.7 - 7.7 K/uL   Lymphocytes Relative 29 12 - 46 %   Lymphs Abs 2.0 0.7 - 4.0 K/uL   Monocytes Relative 10 3 - 12 %   Monocytes Absolute 0.7 0.1 - 1.0 K/uL   Eosinophils Relative 6 (H) 0 - 5 %   Eosinophils Absolute 0.4 0.0 - 0.7 K/uL   Basophils Relative 1 0 - 1 %   Basophils Absolute 0.1 0.0 - 0.1 K/uL   Smear Review Criteria for review not met   Comprehensive metabolic panel  Result Value Ref Range   Sodium 138 135 - 146 mmol/L   Potassium 4.0 3.5 - 5.3 mmol/L   Chloride 102 98 - 110 mmol/L   CO2 29 20 - 31 mmol/L   Glucose, Bld 78 65 - 99 mg/dL   BUN 6 (L) 7 - 25 mg/dL   Creat 0.57 (L) 0.70 - 1.25 mg/dL   Total Bilirubin 2.0 (H) 0.2 - 1.2 mg/dL   Alkaline Phosphatase 125 (H) 40 - 115 U/L   AST 39 (H) 10 - 35 U/L   ALT 24 9 - 46 U/L   Total Protein 6.4 6.1 - 8.1 g/dL   Albumin 3.9 3.6 - 5.1 g/dL   Calcium 9.6  8.6 - 10.3 mg/dL  AntiMicrosomal Ab-Liver / Kidney  Result Value Ref Range   LKM1 Ab <=20.0 <=20.0 U  ANA  Result Value Ref Range   Anit Nuclear Antibody(ANA) NEG NEGATIVE  Anti-Smooth Muscle Antibody, IGG  Result Value Ref Range   Smooth Muscle Ab 10 <20 U  Assessment & Plan:   Problem List Items Addressed This Visit      Other   Depression with anxiety - Primary    Because of alcohol use will start to wean off Xanax. We'll have back in a month to reassess.      Relevant Orders   15+Oxycodone+Crt-Scr, U    Other Visit Diagnoses    Alcoholism ([place])        Relevant Orders    Ammonia    Vitamin B12    Folate    Vitamin B1    Confusion        Patient has confusion and possibly some early dementia that could be due to alcohol or other causes. MMSE was 25    Relevant Orders    Ambulatory referral to [place]        Follow up plan: Return in about 4 weeks (around 01/02/2015), or if symptoms worsen or fail to improve, for f/u depression and anxiety.  Caryl Pina, MD [facility] 12/05/2014, 2:59 PM

## 2014-12-07 LAB — 15+OXYCODONE+CRT-SCR, U
Amphetamine Screen, Urine: NEGATIVE ng/mL
BENZODIAZ UR QL: POSITIVE ng/mL
BUPRENORPHINE, URINE: NEGATIVE ng/mL
Barbiturate screen, urine: NEGATIVE ng/mL
COCAINE (METAB.), URINE: NEGATIVE ng/mL
Cannabinoid Quant, Ur: NEGATIVE ng/mL
Carisoprodol/Meprobamate, Ur: NEGATIVE ng/mL
Creatinine, Urine: 149.5 mg/dL (ref 20.0–300.0)
FENTANYL, URINE: NEGATIVE pg/mL
Meperidine: NEGATIVE ng/mL
Methadone Screen, Urine: NEGATIVE ng/mL
OPIATE SCREEN URINE: NEGATIVE ng/mL
OXYCODONE+OXYMORPHONE UR QL SCN: NEGATIVE ng/mL
Propoxyphene: NEGATIVE ng/mL
TAPENTADOL, URINE: NEGATIVE ng/mL
Tramadol: NEGATIVE ng/mL
ZOLPIDEM (AMBIEN), URINE: NEGATIVE ng/mL

## 2014-12-07 LAB — VITAMIN B12: Vitamin B-12: 576 pg/mL (ref 211–946)

## 2014-12-07 LAB — VITAMIN B1: THIAMINE: 157.9 nmol/L (ref 66.5–200.0)

## 2014-12-07 LAB — FOLATE: Folate: 13 ng/mL (ref >3.0)

## 2014-12-07 LAB — AMMONIA: Ammonia: 36 ug/dL (ref 27–102)

## 2014-12-07 NOTE — Progress Notes (Signed)
Referral sent to [facility] for nursing to help wih medicatrion

## 2015-01-02 ENCOUNTER — Ambulatory Visit: Payer: Medicare Other | Admitting: Family Medicine

## 2015-01-04 ENCOUNTER — Ambulatory Visit: Payer: Medicare Other | Admitting: Family Medicine

## 2015-01-05 DIAGNOSIS — F10129 Alcohol abuse with intoxication, unspecified: Secondary | ICD-10-CM | POA: Diagnosis not present

## 2015-01-05 DIAGNOSIS — R404 Transient alteration of awareness: Secondary | ICD-10-CM | POA: Diagnosis not present

## 2015-01-05 DIAGNOSIS — F17200 Nicotine dependence, unspecified, uncomplicated: Secondary | ICD-10-CM | POA: Diagnosis not present

## 2015-01-05 DIAGNOSIS — T148 Other injury of unspecified body region: Secondary | ICD-10-CM | POA: Diagnosis not present

## 2015-01-05 DIAGNOSIS — F4489 Other dissociative and conversion disorders: Secondary | ICD-10-CM | POA: Diagnosis not present

## 2015-01-05 DIAGNOSIS — S59912A Unspecified injury of left forearm, initial encounter: Secondary | ICD-10-CM | POA: Diagnosis not present

## 2015-01-08 ENCOUNTER — Ambulatory Visit: Payer: Medicare Other | Admitting: Family Medicine

## 2015-01-09 ENCOUNTER — Encounter: Payer: Self-pay | Admitting: Physician Assistant

## 2015-02-05 ENCOUNTER — Telehealth: Payer: Self-pay | Admitting: Nurse Practitioner

## 2015-02-05 ENCOUNTER — Encounter: Payer: Self-pay | Admitting: Nurse Practitioner

## 2015-02-05 ENCOUNTER — Ambulatory Visit: Payer: Medicare Other | Admitting: Nurse Practitioner

## 2015-02-05 NOTE — Telephone Encounter (Signed)
Noted  

## 2015-02-05 NOTE — Telephone Encounter (Signed)
PATIENT WAS A NO SHOW AND LETTER SENT  °

## 2015-02-13 ENCOUNTER — Other Ambulatory Visit: Payer: Self-pay | Admitting: Physician Assistant

## 2015-02-13 MED ORDER — ALPRAZOLAM 0.5 MG PO TABS: 0.5000 mg | ORAL_TABLET | Freq: Every day | ORAL | Status: DC | PRN

## 2015-02-13 NOTE — Telephone Encounter (Signed)
Last seen and filled 12/05/14. Route to pool, for nurse to call

## 2015-02-13 NOTE — Telephone Encounter (Signed)
Patient was supposed to come back in one month to reassess how he was doing and it is been almost 2 months. You may give a 1 week prescription of Xanax but then have him schedule an appointment within that week to come back as no further refills will be given until he is seen after that.

## 2015-02-18 ENCOUNTER — Telehealth: Payer: Self-pay | Admitting: Family Medicine

## 2015-02-18 ENCOUNTER — Other Ambulatory Visit: Payer: Self-pay | Admitting: Family Medicine

## 2015-02-18 NOTE — Telephone Encounter (Signed)
rx called into pharmacy

## 2015-02-19 NOTE — Telephone Encounter (Signed)
I can't tell for sure but it looks like I did this on the 4th of January, if not then go ahead and refill? Caryl Pina, MD [facility] 02/19/2015, 11:54 AM

## 2015-02-19 NOTE — Telephone Encounter (Signed)
Last seen 12/05/14  Dr Dettinger  If approved route to nurse to call into [facility]

## 2015-03-07 ENCOUNTER — Ambulatory Visit: Payer: Medicare Other | Admitting: Family Medicine

## 2015-03-08 ENCOUNTER — Encounter: Payer: Self-pay | Admitting: Family Medicine

## 2015-03-08 ENCOUNTER — Ambulatory Visit (INDEPENDENT_AMBULATORY_CARE_PROVIDER_SITE_OTHER): Payer: Medicare Other | Admitting: Family Medicine

## 2015-03-08 VITALS — BP 120/63 | HR 56 | Temp 96.8°F | Ht 67.0 in | Wt 143.0 lb

## 2015-03-08 DIAGNOSIS — H40033 Anatomical narrow angle, bilateral: Secondary | ICD-10-CM | POA: Diagnosis not present

## 2015-03-08 DIAGNOSIS — E785 Hyperlipidemia, unspecified: Secondary | ICD-10-CM

## 2015-03-08 DIAGNOSIS — H2513 Age-related nuclear cataract, bilateral: Secondary | ICD-10-CM | POA: Diagnosis not present

## 2015-03-08 DIAGNOSIS — F411 Generalized anxiety disorder: Secondary | ICD-10-CM

## 2015-03-08 MED ORDER — ALPRAZOLAM 0.5 MG PO TABS: 0.5000 mg | ORAL_TABLET | Freq: Two times a day (BID) | ORAL | Status: DC | PRN

## 2015-03-08 MED ORDER — ATORVASTATIN CALCIUM 40 MG PO TABS: 40.0000 mg | ORAL_TABLET | Freq: Every day | ORAL | Status: DC

## 2015-03-08 NOTE — Progress Notes (Signed)
Subjective:  Patient ID: Lee Wang, male    DOB: Jul 12, 1945  Age: 70 y.o. MRN: [id number]  CC: Hypertension and Cough   HPI Lee Wang presents for  follow-up of hypertension. Patient has no history of headache chest pain or shortness of breath Patient also denies symptoms of TIA such as numbness weakness lateralizing. Patient checks  blood pressure at home and has not had any elevated readings recently. Patient denies side effects from medication. States taking it regularly.  GAD 7 : Generalized Anxiety Score 03/08/2015  Nervous, Anxious, on Edge 1  Control/stop worrying 2  Worry too much - different things 2  Trouble relaxing 1  Restless 1  Easily annoyed or irritable 1  Afraid - awful might happen 1  Total GAD 7 Score 9  Anxiety Difficulty Not difficult at all     Depression screen [facility] 2/9 03/08/2015 12/05/2014 09/28/2014 09/28/2014 09/12/2014  Decreased Interest 0 0 0 0 0  Down, Depressed, Hopeless 1 0 0 0 0  PHQ - 2 Score 1 0 0 0 0       History Lee Wang has a past medical history of Hyperlipidemia; Hypertension; Anxiety; Depression; and Cancer ([place]) (prostate).   He has past surgical history that includes Prostate surgery; Fracture surgery (Bilateral); Cataract extraction w/PHACO (Right, 08/21/2013); Cataract extraction w/PHACO (Left, 09/14/2013); and Colonoscopy (2011).   His family history includes Alzheimer's disease in his mother and paternal grandfather; Heart disease in his father. There is no history of Colon cancer or Liver disease.He reports that he has been smoking Cigarettes.  He started smoking about 56 years ago. He has a 50 pack-year smoking history. His smokeless tobacco use includes Chew. He reports that he drinks about 6.0 oz of alcohol per week. He reports that he does not use illicit drugs.  Current Outpatient Prescriptions on File Prior to Visit  Medication Sig Dispense Refill  . fenofibrate (TRICOR) 145 MG tablet Take 1 tablet (145 mg total) by  mouth daily. (Patient not taking: Reported on 03/08/2015) 30 tablet 11  . Fish Oil OIL Take 1 capsule by mouth daily. Reported on 03/08/2015     No current facility-administered medications on file prior to visit.    ROS Review of Systems  Constitutional: Negative for fever, chills, diaphoresis and unexpected weight change.  HENT: Negative for congestion, hearing loss, rhinorrhea and sore throat.   Eyes: Negative for visual disturbance.  Respiratory: Negative for cough and shortness of breath.   Cardiovascular: Negative for chest pain.  Gastrointestinal: Negative for abdominal pain, diarrhea and constipation.  Genitourinary: Negative for dysuria and flank pain.  Musculoskeletal: Negative for joint swelling and arthralgias.  Skin: Negative for rash.  Neurological: Negative for dizziness and headaches.  Psychiatric/Behavioral: Negative for sleep disturbance and dysphoric mood.    Objective:  BP 120/63 mmHg  Pulse 56  Temp(Src) 96.8 F (36 C) (Oral)  Ht 5' 7"  (1.702 m)  Wt 143 lb (64.864 kg)  BMI 22.39 kg/m2  BP Readings from Last 3 Encounters:  03/08/15 120/63  12/05/14 142/90  11/05/14 149/84    Wt Readings from Last 3 Encounters:  03/08/15 143 lb (64.864 kg)  12/05/14 139 lb (63.05 kg)  11/05/14 137 lb (62.143 kg)     Physical Exam  Constitutional: He is oriented to person, place, and time. He appears well-developed and well-nourished. No distress.  HENT:  Head: Normocephalic and atraumatic.  Right Ear: External ear normal.  Left Ear: External ear normal.  Nose: Nose normal.  Mouth/Throat: Oropharynx is clear and moist.  Eyes: Conjunctivae and EOM are normal. Pupils are equal, round, and reactive to light.  Neck: Normal range of motion. Neck supple. No thyromegaly present.  Cardiovascular: Normal rate, regular rhythm and normal heart sounds.   No murmur heard. Pulmonary/Chest: Effort normal and breath sounds normal. No respiratory distress. He has no wheezes. He  has no rales.  Abdominal: Soft. Bowel sounds are normal. He exhibits no distension. There is no tenderness.  Lymphadenopathy:    He has no cervical adenopathy.  Neurological: He is alert and oriented to person, place, and time. He has normal reflexes.  Skin: Skin is warm and dry.  Psychiatric: He has a normal mood and affect. His behavior is normal. Judgment and thought content normal.     Lab Results  Component Value Date   WBC 6.8 11/05/2014   HGB 15.3 11/05/2014   HCT 43.5 11/05/2014   PLT 232 11/05/2014   GLUCOSE 78 11/05/2014   CHOL 130 03/30/2013   TRIG 139 03/30/2013   HDL 41 03/30/2013   LDLCALC 61 03/30/2013   ALT 24 11/05/2014   AST 39* 11/05/2014   NA 138 11/05/2014   K 4.0 11/05/2014   CL 102 11/05/2014   CREATININE 0.57* 11/05/2014   BUN 6* 11/05/2014   CO2 29 11/05/2014   TSH 3.360 03/30/2013   PSA <0.1 03/30/2013   INR 1.00 09/11/2010    No results found.  Assessment & Plan:   Lee Wang was seen today for hypertension and cough.  Diagnoses and all orders for this visit:  Hyperlipemia -     CBC with Differential/Platelet; Future -     CMP14+EGFR; Future -     Lipid panel; Future  GAD (generalized anxiety disorder)  Other orders -     Cancel: ALPRAZolam (XANAX) 0.5 MG tablet; Take 1 tablet (0.5 mg total) by mouth daily as needed. for anxiety -     atorvastatin (LIPITOR) 40 MG tablet; Take 1 tablet (40 mg total) by mouth daily. -     Cancel: propranolol (INDERAL) 20 MG tablet; Take 1 tablet (20 mg total) by mouth 3 (three) times daily. -     ALPRAZolam (XANAX) 0.5 MG tablet; Take 1 tablet (0.5 mg total) by mouth 2 (two) times daily as needed. for anxiety   I have discontinued Lee Wang's propranolol and polyethylene glycol-electrolytes. I am also having him maintain his fenofibrate, Fish Oil, atorvastatin, and ALPRAZolam.  Meds ordered this encounter  Medications  . atorvastatin (LIPITOR) 40 MG tablet    Sig: Take 1 tablet (40 mg total) by mouth  daily.    Dispense:  30 tablet    Refill:  11  . ALPRAZolam (XANAX) 0.5 MG tablet    Sig: Take 1 tablet (0.5 mg total) by mouth 2 (two) times daily as needed. for anxiety    Dispense:  60 tablet    Refill:  5      Follow-up: Return in about 6 months (around 09/05/2015).  Claretta Fraise, M.D.

## 2015-03-09 DIAGNOSIS — F411 Generalized anxiety disorder: Secondary | ICD-10-CM | POA: Insufficient documentation

## 2015-03-14 ENCOUNTER — Other Ambulatory Visit (INDEPENDENT_AMBULATORY_CARE_PROVIDER_SITE_OTHER): Payer: Medicare Other

## 2015-03-14 DIAGNOSIS — E785 Hyperlipidemia, unspecified: Secondary | ICD-10-CM | POA: Diagnosis not present

## 2015-03-15 LAB — CMP14+EGFR
A/G RATIO: 1.8 (ref 1.1–2.5)
ALBUMIN: 4.1 g/dL (ref 3.6–4.8)
ALT: 18 IU/L (ref 0–44)
AST: 30 IU/L (ref 0–40)
Alkaline Phosphatase: 212 IU/L — ABNORMAL HIGH (ref 39–117)
BILIRUBIN TOTAL: 0.9 mg/dL (ref 0.0–1.2)
BUN / CREAT RATIO: 9 — AB (ref 10–22)
BUN: 7 mg/dL — AB (ref 8–27)
CALCIUM: 10 mg/dL (ref 8.6–10.2)
CO2: 23 mmol/L (ref 18–29)
Chloride: 96 mmol/L (ref 96–106)
Creatinine, Ser: 0.75 mg/dL — ABNORMAL LOW (ref 0.76–1.27)
GFR, EST AFRICAN AMERICAN: 108 mL/min/{1.73_m2} (ref >59)
GFR, EST NON AFRICAN AMERICAN: 94 mL/min/{1.73_m2} (ref >59)
GLUCOSE: 86 mg/dL (ref 65–99)
Globulin, Total: 2.3 g/dL (ref 1.5–4.5)
Potassium: 4.8 mmol/L (ref 3.5–5.2)
Sodium: 137 mmol/L (ref 134–144)
TOTAL PROTEIN: 6.4 g/dL (ref 6.0–8.5)

## 2015-03-15 LAB — CBC WITH DIFFERENTIAL/PLATELET
BASOS: 1 %
Basophils Absolute: 0.1 10*3/uL (ref 0.0–0.2)
EOS (ABSOLUTE): 0.4 10*3/uL (ref 0.0–0.4)
EOS: 4 %
HEMOGLOBIN: 14.6 g/dL (ref 12.6–17.7)
Hematocrit: 42.9 % (ref 37.5–51.0)
IMMATURE GRANS (ABS): 0 10*3/uL (ref 0.0–0.1)
IMMATURE GRANULOCYTES: 0 %
LYMPHS: 23 %
Lymphocytes Absolute: 2.1 10*3/uL (ref 0.7–3.1)
MCH: 33 pg (ref 26.6–33.0)
MCHC: 34 g/dL (ref 31.5–35.7)
MCV: 97 fL (ref 79–97)
MONOCYTES: 12 %
Monocytes Absolute: 1.1 10*3/uL — ABNORMAL HIGH (ref 0.1–0.9)
NEUTROS ABS: 5.5 10*3/uL (ref 1.4–7.0)
NEUTROS PCT: 60 %
PLATELETS: 321 10*3/uL (ref 150–379)
RBC: 4.42 x10E6/uL (ref 4.14–5.80)
RDW: 14 % (ref 12.3–15.4)
WBC: 9.1 10*3/uL (ref 3.4–10.8)

## 2015-03-15 LAB — LIPID PANEL
CHOL/HDL RATIO: 2.2 ratio (ref 0.0–5.0)
Cholesterol, Total: 170 mg/dL (ref 100–199)
HDL: 76 mg/dL (ref >39)
LDL Calculated: 80 mg/dL (ref 0–99)
Triglycerides: 68 mg/dL (ref 0–149)
VLDL CHOLESTEROL CAL: 14 mg/dL (ref 5–40)

## 2015-06-07 ENCOUNTER — Other Ambulatory Visit: Payer: Self-pay | Admitting: Nurse Practitioner

## 2015-09-19 ENCOUNTER — Ambulatory Visit: Payer: Medicare Other | Admitting: Family Medicine

## 2015-09-19 ENCOUNTER — Encounter: Payer: Self-pay | Admitting: Family Medicine

## 2015-09-19 ENCOUNTER — Ambulatory Visit (INDEPENDENT_AMBULATORY_CARE_PROVIDER_SITE_OTHER): Payer: Medicare Other | Admitting: Family Medicine

## 2015-09-19 VITALS — BP 158/86 | HR 89 | Temp 97.6°F | Ht 67.0 in | Wt 140.0 lb

## 2015-09-19 DIAGNOSIS — I10 Essential (primary) hypertension: Secondary | ICD-10-CM

## 2015-09-19 DIAGNOSIS — F411 Generalized anxiety disorder: Secondary | ICD-10-CM

## 2015-09-19 MED ORDER — ESCITALOPRAM OXALATE 10 MG PO TABS: 10.0000 mg | ORAL_TABLET | Freq: Every day | ORAL | 1 refills | Status: DC

## 2015-09-19 MED ORDER — PROPRANOLOL HCL 20 MG PO TABS
20.0000 mg | ORAL_TABLET | Freq: Three times a day (TID) | ORAL | 2 refills | Status: DC
Start: 2015-09-19 — End: 2019-11-22

## 2015-09-19 MED ORDER — ALPRAZOLAM 0.5 MG PO TABS: 0.5000 mg | ORAL_TABLET | Freq: Two times a day (BID) | ORAL | 2 refills | Status: DC | PRN

## 2015-09-19 NOTE — Assessment & Plan Note (Signed)
Patient has been out of his medication and that is why his blood pressure elevated, will have him come back in 4 weeks to recheck

## 2015-09-19 NOTE — Assessment & Plan Note (Signed)
A she is on Xanax but will try and add preventative medication of Lexapro and have come back in 4 weeks and see how it goes

## 2015-09-19 NOTE — Progress Notes (Signed)
BP (!) 158/86 (BP Location: Left Arm, Cuff Size: Normal)   Pulse 89   Temp 97.6 F (36.4 C) (Oral)   Ht 5' 7" (1.702 m)   Wt 140 lb (63.5 kg)   BMI 21.93 kg/m    Subjective:    Patient ID: Lee Wang, male    DOB: 1945-05-07, 70 y.o.   MRN: [id number]  HPI: Lee Wang is a 70 y.o. male presenting on 09/19/2015 for Medication refills (Xanax & Inderal)   HPI Hypertension recheck Patient is coming in today for recheck on hypertension. His blood pressure today is 158/86. He says he has been out of his blood pressure medication for at least the past 2 days. He usually takes Inderal. Patient denies headaches, blurred vision, chest pains, shortness of breath, or weakness. Denies any side effects from medication and is content with current medication.   Anxiety disorder Patient is currently on Xanax and Xanax alone for his anxiety and says is doing very well. He is willing to try something to try and prevent anxiety from building up. He denies any suicidal ideations or depression or difficulty sleeping. He has been doing relatively well on his Xanax. He does still smoke and has some breathing issues but that has not attributed to his anxiety.  Relevant past medical, surgical, family and social history reviewed and updated as indicated. Interim medical history since our last visit reviewed. Allergies and medications reviewed and updated.  Review of Systems  Constitutional: Negative for fever.  HENT: Negative for ear discharge and ear pain.   Eyes: Negative for discharge and visual disturbance.  Respiratory: Negative for shortness of breath and wheezing.   Cardiovascular: Negative for chest pain and leg swelling.  Gastrointestinal: Negative for abdominal pain, constipation and diarrhea.  Genitourinary: Negative for difficulty urinating.  Musculoskeletal: Negative for back pain and gait problem.  Skin: Negative for rash.  Neurological: Negative for syncope, light-headedness and  headaches.  Psychiatric/Behavioral: Negative for decreased concentration, dysphoric mood, self-injury, sleep disturbance and suicidal ideas. The patient is nervous/anxious.   All other systems reviewed and are negative.   Per HPI unless specifically indicated above     Medication List       Accurate as of 09/19/15  9:08 AM. Always use your most recent med list.          ALPRAZolam 0.5 MG tablet Commonly known as:  XANAX Take 1 tablet (0.5 mg total) by mouth 2 (two) times daily as needed. for anxiety   atorvastatin 40 MG tablet Commonly known as:  LIPITOR Take 1 tablet (40 mg total) by mouth daily.   escitalopram 10 MG tablet Commonly known as:  LEXAPRO Take 1 tablet (10 mg total) by mouth daily.   Fish Oil Oil Take 1 capsule by mouth daily. Reported on 03/08/2015   propranolol 20 MG tablet Commonly known as:  INDERAL Take 1 tablet (20 mg total) by mouth 3 (three) times daily.          Objective:    BP (!) 158/86 (BP Location: Left Arm, Cuff Size: Normal)   Pulse 89   Temp 97.6 F (36.4 C) (Oral)   Ht 5' 7" (1.702 m)   Wt 140 lb (63.5 kg)   BMI 21.93 kg/m   Wt Readings from Last 3 Encounters:  09/19/15 140 lb (63.5 kg)  03/08/15 143 lb (64.9 kg)  12/05/14 139 lb (63 kg)    Physical Exam  Constitutional: He is oriented to person, place, and  time. He appears well-developed and well-nourished. No distress.  Eyes: Conjunctivae and EOM are normal. Pupils are equal, round, and reactive to light. Right eye exhibits no discharge. No scleral icterus.  Neck: Neck supple. No thyromegaly present.  Cardiovascular: Normal rate, regular rhythm, normal heart sounds and intact distal pulses.   No murmur heard. Pulmonary/Chest: Effort normal. No respiratory distress. He has wheezes (Patient says this is his normal state and does not feel short of breath or worse then he is normally.) in the right upper field and the left upper field.  Musculoskeletal: Normal range of motion.  He exhibits no edema.  Lymphadenopathy:    He has no cervical adenopathy.  Neurological: He is alert and oriented to person, place, and time. Coordination normal.  Skin: Skin is warm and dry. No rash noted. He is not diaphoretic.  Psychiatric: His behavior is normal. Judgment normal. His mood appears anxious. He expresses no suicidal ideation. He expresses no suicidal plans.  Nursing note and vitals reviewed.     Assessment & Plan:   Problem List Items Addressed This Visit      Cardiovascular and Mediastinum   HTN (hypertension) - Primary    Patient has been out of his medication and that is why his blood pressure elevated, will have him come back in 4 weeks to recheck      Relevant Medications   propranolol (INDERAL) 20 MG tablet   Other Relevant Orders   CMP14+EGFR     Other   GAD (generalized anxiety disorder)    A she is on Xanax but will try and add preventative medication of Lexapro and have come back in 4 weeks and see how it goes      Relevant Medications   escitalopram (LEXAPRO) 10 MG tablet   ALPRAZolam (XANAX) 0.5 MG tablet    Other Visit Diagnoses   None.      Follow up plan: Return in about 4 weeks (around 10/17/2015), or if symptoms worsen or fail to improve, for Recheck anxiety and hypertension.  Counseling provided for all of the vaccine components Orders Placed This Encounter  Procedures  . CMP14+EGFR     , MD [facility] 09/19/2015, 9:08 AM     

## 2015-10-17 ENCOUNTER — Ambulatory Visit: Payer: Medicare Other | Admitting: Family Medicine

## 2015-10-18 ENCOUNTER — Encounter: Payer: Self-pay | Admitting: Family Medicine

## 2016-01-29 ENCOUNTER — Other Ambulatory Visit: Payer: Self-pay | Admitting: Family Medicine

## 2016-01-29 DIAGNOSIS — F411 Generalized anxiety disorder: Secondary | ICD-10-CM

## 2016-01-29 DIAGNOSIS — I10 Essential (primary) hypertension: Secondary | ICD-10-CM

## 2016-02-09 ENCOUNTER — Other Ambulatory Visit: Payer: Self-pay | Admitting: Family Medicine

## 2016-02-09 DIAGNOSIS — I10 Essential (primary) hypertension: Secondary | ICD-10-CM

## 2016-06-18 DIAGNOSIS — S91101A Unspecified open wound of right great toe without damage to nail, initial encounter: Secondary | ICD-10-CM | POA: Diagnosis not present

## 2016-06-18 DIAGNOSIS — S42017A Nondisplaced fracture of sternal end of right clavicle, initial encounter for closed fracture: Secondary | ICD-10-CM | POA: Diagnosis not present

## 2016-06-18 DIAGNOSIS — S42031A Displaced fracture of lateral end of right clavicle, initial encounter for closed fracture: Secondary | ICD-10-CM | POA: Diagnosis not present

## 2016-06-18 DIAGNOSIS — R222 Localized swelling, mass and lump, trunk: Secondary | ICD-10-CM | POA: Diagnosis not present

## 2016-06-18 DIAGNOSIS — S20211A Contusion of right front wall of thorax, initial encounter: Secondary | ICD-10-CM | POA: Diagnosis not present

## 2016-06-18 DIAGNOSIS — R0789 Other chest pain: Secondary | ICD-10-CM | POA: Diagnosis not present

## 2016-06-18 DIAGNOSIS — S129XXA Fracture of neck, unspecified, initial encounter: Secondary | ICD-10-CM | POA: Diagnosis not present

## 2016-07-01 DIAGNOSIS — I10 Essential (primary) hypertension: Secondary | ICD-10-CM | POA: Diagnosis not present

## 2016-07-01 DIAGNOSIS — E785 Hyperlipidemia, unspecified: Secondary | ICD-10-CM | POA: Diagnosis not present

## 2016-07-01 DIAGNOSIS — Z23 Encounter for immunization: Secondary | ICD-10-CM | POA: Diagnosis not present

## 2016-07-08 DIAGNOSIS — M25511 Pain in right shoulder: Secondary | ICD-10-CM | POA: Diagnosis not present

## 2016-07-08 DIAGNOSIS — S42013A Anterior displaced fracture of sternal end of unspecified clavicle, initial encounter for closed fracture: Secondary | ICD-10-CM | POA: Diagnosis not present

## 2016-07-08 DIAGNOSIS — S42031A Displaced fracture of lateral end of right clavicle, initial encounter for closed fracture: Secondary | ICD-10-CM | POA: Diagnosis not present

## 2016-09-11 DIAGNOSIS — E785 Hyperlipidemia, unspecified: Secondary | ICD-10-CM | POA: Diagnosis not present

## 2016-09-11 DIAGNOSIS — I10 Essential (primary) hypertension: Secondary | ICD-10-CM | POA: Diagnosis not present

## 2016-10-06 DIAGNOSIS — S46212A Strain of muscle, fascia and tendon of other parts of biceps, left arm, initial encounter: Secondary | ICD-10-CM | POA: Diagnosis not present

## 2016-10-08 ENCOUNTER — Ambulatory Visit (INDEPENDENT_AMBULATORY_CARE_PROVIDER_SITE_OTHER): Payer: Self-pay

## 2016-10-08 ENCOUNTER — Other Ambulatory Visit (INDEPENDENT_AMBULATORY_CARE_PROVIDER_SITE_OTHER): Payer: Self-pay | Admitting: Orthopaedic Surgery

## 2016-10-08 ENCOUNTER — Encounter (INDEPENDENT_AMBULATORY_CARE_PROVIDER_SITE_OTHER): Payer: Self-pay | Admitting: Orthopaedic Surgery

## 2016-10-08 ENCOUNTER — Ambulatory Visit (INDEPENDENT_AMBULATORY_CARE_PROVIDER_SITE_OTHER): Payer: Medicare Other | Admitting: Orthopaedic Surgery

## 2016-10-08 VITALS — BP 119/77 | HR 107 | Ht 68.0 in | Wt 160.0 lb

## 2016-10-08 DIAGNOSIS — M79622 Pain in left upper arm: Secondary | ICD-10-CM | POA: Diagnosis not present

## 2016-10-08 DIAGNOSIS — S42252A Displaced fracture of greater tuberosity of left humerus, initial encounter for closed fracture: Secondary | ICD-10-CM | POA: Diagnosis not present

## 2016-10-08 DIAGNOSIS — S4292XA Fracture of left shoulder girdle, part unspecified, initial encounter for closed fracture: Secondary | ICD-10-CM | POA: Diagnosis not present

## 2016-10-08 DIAGNOSIS — S42432A Displaced fracture (avulsion) of lateral epicondyle of left humerus, initial encounter for closed fracture: Secondary | ICD-10-CM | POA: Diagnosis not present

## 2016-10-08 NOTE — Progress Notes (Signed)
Office Visit Note consultation request urgent care [facility]   Patient: Lee Wang           Date of Birth: [birth year]           MRN: [id number] Visit Date: 10/08/2016              Requested by: Urgent care [facility]  PCP: No primary care provider on file.   Assessment & Plan: Visit Diagnoses:  1. Left upper arm pain   2. Closed fracture dislocation of joint of left shoulder girdle, initial encounter     Plan: I called Dr.Zhabo at the [facility] and he will see patient attempted closed reduction under conscious sedation. At the successful though put him in a sling. If not he'll need referral for open reduction and fixation. Copies of x-rays given to patient so he will take it with him. Thank for the opportunity to see him in consultation.  Follow-Up Instructions: Return in about 1 week (around 10/15/2016).     I was called by emergency room attempted under conscious sedation reduction was unsuccessful. He'll need to proceed with surgery open reduction internal fixation and fixation of the greater tuberosity fracture next week.   Orders:  Orders Placed This Encounter  Procedures  . XR Humerus Left   No orders of the defined types were placed in this encounter.     Procedures: No procedures performed   Clinical Data: No additional findings.   Subjective: Chief Complaint  Patient presents with  . Left Arm - Pain    HPI 71-year-old male fell somewhere between 2 and 3 weeks ago with pain in his left shoulder. He was seen in urgent care medicine yesterday part of [facility] clinic and diagnosis suspected biceps tendon rupture. Does not have any numbness in his hand he has limited range of motion of the shoulder and has had pain.  Review of Systems positive history for diabetes abdominal pain alcohol abuse depression with anxiety prostate cancer elevated LFTs likely related to alcohol.   Objective: Vital Signs: BP 119/77   Pulse (!)  107   Ht 5\' 8"  (1.727 m)   Wt 160 lb (72.6 kg)   BMI 24.33 kg/m   Physical Exam  Constitutional: He is oriented to person, place, and time. He appears well-developed and well-nourished.  HENT:  Head: Normocephalic and atraumatic.  Eyes: Pupils are equal, round, and reactive to light. EOM are normal.  Neck: No tracheal deviation present. No thyromegaly present.  Cardiovascular: Normal rate.   Pulmonary/Chest: Effort normal. He has no wheezes.  Abdominal: Soft. Bowel sounds are normal.  Musculoskeletal:  Patient is about 30 abduction of the shoulder. There is considerable subcutaneous edema proximal to the medial upper condyle. Long head of the biceps is palpable. No distal biceps migration distal biceps is normal. Limited the external rotation of the shoulder or swelling around the shoulder.  Neurological: He is alert and oriented to person, place, and time.  Skin: Skin is warm and dry. Capillary refill takes less than 2 seconds.  Psychiatric: He has a normal mood and affect. His behavior is normal. Judgment and thought content normal.    Ortho Exam  Specialty Comments:  No specialty comments available.  Imaging: Xr Humerus Left  Result Date: 10/08/2016 Three-view x-rays left femur shows anterior shoulder dislocation with greater tuberosity fracture. Impression: Fracture dislocation of the humerus. Glenohumeral joint is dislocated.    PMFS History: Patient Active Problem List  Diagnosis Date Noted  . Closed fracture dislocation of joint of left shoulder girdle 10/08/2016  . GAD (generalized anxiety disorder) 03/09/2015  . Elevated LFTs 11/05/2014  . Abnormal [place] (ultrasound) of abdomen 11/05/2014  . Hepatomegaly 11/05/2014  . History of colonic polyps 11/05/2014  . HTN (hypertension) 05/28/2010  . Hyperlipemia 05/28/2010  . Depression with anxiety 05/28/2010  . Prostate CA ([place]) 05/28/2010  . BPH (benign prostatic hyperplasia) 05/28/2010  . Alcohol abuse 07/18/2009  .  HEMATOCHEZIA 07/18/2009  . DIARRHEA 07/18/2009  . ABDOMINAL PAIN, UNSPECIFIED SITE 07/18/2009   Past Medical History:  Diagnosis Date  . Anxiety   . Cancer [facility]) prostate   ? status, pending follow-up  . Depression   . Hyperlipidemia   . Hypertension     Family History  Problem Relation Age of Onset  . Alzheimer's disease Mother   . Heart disease Father   . Alzheimer's disease Paternal Grandfather   . Colon cancer Neg Hx   . Liver disease Neg Hx     Past Surgical History:  Procedure Laterality Date  . CATARACT EXTRACTION W/PHACO Right 08/21/2013   Procedure: CATARACT EXTRACTION PHACO AND INTRAOCULAR LENS PLACEMENT (IOC);  Surgeon: Tonny Branch, MD;  Location: [facility];  Service: Ophthalmology;  Laterality: Right;  CDE 7.34  . CATARACT EXTRACTION W/PHACO Left 09/14/2013   Procedure: CATARACT EXTRACTION PHACO AND INTRAOCULAR LENS PLACEMENT (IOC);  Surgeon: Tonny Branch, MD;  Location: [facility];  Service: Ophthalmology;  Laterality: Left;  CDE: 11.50  . COLONOSCOPY  2011   Recc repeat 3 years external / anal canal hemorrhoids, otherwise normal rectum. 2. multiple colonic polyps with largest ulcerated pedunculated polyp in the midsigmoid, status post multiple  hot snare polypectomies. no evidence of colitis or diverticuliosis.   Marland Kitchen FRACTURE SURGERY Bilateral    ankle  . PROSTATE SURGERY     radiation implants   Social History   Occupational History  . Not on file.   Social History Main Topics  . Smoking status: Current Every Day Smoker    Packs/day: 1.00    Years: 50.00    Types: Cigarettes    Start date: 08/09/1958  . Smokeless tobacco: Current User    Types: Chew     Comment: twice a year  . Alcohol use 6.0 oz/week    10 Standard drinks or equivalent per week     Comment: drinks at least once a week, minimum 6 drinks per occurrence.  . Drug use: No  . Sexual activity: Not on file

## 2016-10-13 ENCOUNTER — Encounter (HOSPITAL_COMMUNITY): Payer: Self-pay | Admitting: *Deleted

## 2016-10-13 NOTE — Progress Notes (Signed)
Spoke with pt's son, Monish Haliburton for pre-op call. He states pt does not have any cardiac history, chest pain or sob. States pt is not a diabetic. Instructed Elta Guadeloupe to have his father not smoke as of now prior to surgery.

## 2016-10-14 ENCOUNTER — Encounter (HOSPITAL_COMMUNITY)
Admission: RE | Disposition: A | Discharge status: Home / Self Care | Payer: Self-pay | Source: Ambulatory Visit | Attending: Orthopaedic Surgery

## 2016-10-14 ENCOUNTER — Encounter (HOSPITAL_COMMUNITY): Payer: Self-pay | Admitting: *Deleted

## 2016-10-14 ENCOUNTER — Ambulatory Visit (HOSPITAL_COMMUNITY): Payer: Medicare Other | Admitting: Anesthesiology

## 2016-10-14 ENCOUNTER — Ambulatory Visit (HOSPITAL_COMMUNITY): Payer: Medicare Other

## 2016-10-14 ENCOUNTER — Observation Stay (HOSPITAL_COMMUNITY)
Admission: RE | Admit: 2016-10-14 | Discharge: 2016-10-15 | Disposition: A | Discharge status: Home / Self Care | Payer: Medicare Other | Source: Ambulatory Visit | Attending: Orthopaedic Surgery | Admitting: Orthopaedic Surgery

## 2016-10-14 DIAGNOSIS — G8918 Other acute postprocedural pain: Secondary | ICD-10-CM | POA: Diagnosis not present

## 2016-10-14 DIAGNOSIS — X58XXXA Exposure to other specified factors, initial encounter: Secondary | ICD-10-CM | POA: Diagnosis not present

## 2016-10-14 DIAGNOSIS — S42253A Displaced fracture of greater tuberosity of unspecified humerus, initial encounter for closed fracture: Secondary | ICD-10-CM | POA: Diagnosis present

## 2016-10-14 DIAGNOSIS — S46212A Strain of muscle, fascia and tendon of other parts of biceps, left arm, initial encounter: Secondary | ICD-10-CM | POA: Diagnosis not present

## 2016-10-14 DIAGNOSIS — F17210 Nicotine dependence, cigarettes, uncomplicated: Secondary | ICD-10-CM | POA: Diagnosis not present

## 2016-10-14 DIAGNOSIS — S4292XA Fracture of left shoulder girdle, part unspecified, initial encounter for closed fracture: Secondary | ICD-10-CM | POA: Diagnosis not present

## 2016-10-14 DIAGNOSIS — E785 Hyperlipidemia, unspecified: Secondary | ICD-10-CM | POA: Diagnosis not present

## 2016-10-14 DIAGNOSIS — S42252A Displaced fracture of greater tuberosity of left humerus, initial encounter for closed fracture: Principal | ICD-10-CM | POA: Insufficient documentation

## 2016-10-14 DIAGNOSIS — I10 Essential (primary) hypertension: Secondary | ICD-10-CM | POA: Insufficient documentation

## 2016-10-14 DIAGNOSIS — M25512 Pain in left shoulder: Secondary | ICD-10-CM | POA: Diagnosis present

## 2016-10-14 HISTORY — PX: ORIF HUMERUS FRACTURE: SHX2126

## 2016-10-14 HISTORY — DX: Other specified health status: Z78.9

## 2016-10-14 HISTORY — DX: Unspecified osteoarthritis, unspecified site: M19.90

## 2016-10-14 LAB — COMPREHENSIVE METABOLIC PANEL
ALK PHOS: 138 U/L — AB (ref 38–126)
ALT: 20 U/L (ref 17–63)
ANION GAP: 11 (ref 5–15)
AST: 35 U/L (ref 15–41)
Albumin: 3.6 g/dL (ref 3.5–5.0)
BILIRUBIN TOTAL: 1.4 mg/dL — AB (ref 0.3–1.2)
BUN: 6 mg/dL (ref 6–20)
CALCIUM: 9.8 mg/dL (ref 8.9–10.3)
CO2: 25 mmol/L (ref 22–32)
Chloride: 97 mmol/L — ABNORMAL LOW (ref 101–111)
Creatinine, Ser: 0.66 mg/dL (ref 0.61–1.24)
GLUCOSE: 89 mg/dL (ref 65–99)
POTASSIUM: 4 mmol/L (ref 3.5–5.1)
Sodium: 133 mmol/L — ABNORMAL LOW (ref 135–145)
TOTAL PROTEIN: 6.5 g/dL (ref 6.5–8.1)

## 2016-10-14 LAB — CBC
HEMATOCRIT: 41.7 % (ref 39.0–52.0)
HEMOGLOBIN: 14.3 g/dL (ref 13.0–17.0)
MCH: 32.7 pg (ref 26.0–34.0)
MCHC: 34.3 g/dL (ref 30.0–36.0)
MCV: 95.4 fL (ref 78.0–100.0)
Platelets: 283 10*3/uL (ref 150–400)
RBC: 4.37 MIL/uL (ref 4.22–5.81)
RDW: 14.5 % (ref 11.5–15.5)
WBC: 6.7 10*3/uL (ref 4.0–10.5)

## 2016-10-14 LAB — PROTIME-INR
INR: 1.05
PROTHROMBIN TIME: 13.6 s (ref 11.4–15.2)

## 2016-10-14 SURGERY — OPEN REDUCTION INTERNAL FIXATION (ORIF) PROXIMAL HUMERUS FRACTURE
Anesthesia: General | Laterality: Left

## 2016-10-14 MED ORDER — ONDANSETRON HCL 4 MG/2ML IJ SOLN
INTRAMUSCULAR | Status: DC | PRN
  Administered 2016-10-14: 4 mg via INTRAVENOUS

## 2016-10-14 MED ORDER — FENTANYL CITRATE (PF) 250 MCG/5ML IJ SOLN
INTRAMUSCULAR | Status: AC
  Filled 2016-10-14: qty 5

## 2016-10-14 MED ORDER — ACETAMINOPHEN 325 MG PO TABS: 650.0000 mg | ORAL_TABLET | Freq: Four times a day (QID) | ORAL | Status: DC | PRN

## 2016-10-14 MED ORDER — VITAMIN B-1 100 MG PO TABS
100.0000 mg | ORAL_TABLET | Freq: Every day | ORAL | Status: DC
  Administered 2016-10-14 – 2016-10-15 (×2): 100 mg via ORAL
  Filled 2016-10-14 (×2): qty 1

## 2016-10-14 MED ORDER — SUCCINYLCHOLINE CHLORIDE 20 MG/ML IJ SOLN
INTRAMUSCULAR | Status: DC | PRN
Start: 2016-10-14 — End: 2016-10-14
  Administered 2016-10-14: 80 mg via INTRAVENOUS

## 2016-10-14 MED ORDER — KETOROLAC TROMETHAMINE 30 MG/ML IJ SOLN
INTRAMUSCULAR | Status: AC
  Filled 2016-10-14: qty 1

## 2016-10-14 MED ORDER — FENTANYL CITRATE (PF) 250 MCG/5ML IJ SOLN
INTRAMUSCULAR | Status: DC | PRN
  Administered 2016-10-14: 100 ug via INTRAVENOUS

## 2016-10-14 MED ORDER — MEPERIDINE HCL 25 MG/ML IJ SOLN: 6.2500 mg | INTRAMUSCULAR | Status: DC | PRN

## 2016-10-14 MED ORDER — DEXAMETHASONE SODIUM PHOSPHATE 10 MG/ML IJ SOLN
INTRAMUSCULAR | Status: DC | PRN
  Administered 2016-10-14: 10 mg via INTRAVENOUS

## 2016-10-14 MED ORDER — 0.9 % SODIUM CHLORIDE (POUR BTL) OPTIME
TOPICAL | Status: DC | PRN
  Administered 2016-10-14: 1000 mL

## 2016-10-14 MED ORDER — ONDANSETRON HCL 4 MG PO TABS: 4.0000 mg | ORAL_TABLET | Freq: Four times a day (QID) | ORAL | Status: DC | PRN

## 2016-10-14 MED ORDER — THIAMINE HCL 100 MG/ML IJ SOLN: 100.0000 mg | Freq: Every day | INTRAMUSCULAR | Status: DC

## 2016-10-14 MED ORDER — LORAZEPAM 2 MG/ML IJ SOLN: 1.0000 mg | Freq: Four times a day (QID) | INTRAMUSCULAR | Status: DC | PRN

## 2016-10-14 MED ORDER — LORAZEPAM 1 MG PO TABS
1.0000 mg | ORAL_TABLET | Freq: Four times a day (QID) | ORAL | Status: DC | PRN
  Administered 2016-10-15: 1 mg via ORAL
  Filled 2016-10-14: qty 1

## 2016-10-14 MED ORDER — GLYCOPYRROLATE 0.2 MG/ML IJ SOLN
INTRAMUSCULAR | Status: DC | PRN
  Administered 2016-10-14: 0.2 mg via INTRAVENOUS

## 2016-10-14 MED ORDER — ATORVASTATIN CALCIUM 40 MG PO TABS
40.0000 mg | ORAL_TABLET | Freq: Every day | ORAL | Status: DC
  Administered 2016-10-14: 40 mg via ORAL
  Filled 2016-10-14: qty 1

## 2016-10-14 MED ORDER — CHLORHEXIDINE GLUCONATE 4 % EX LIQD: 60.0000 mL | Freq: Once | CUTANEOUS | Status: DC

## 2016-10-14 MED ORDER — METOCLOPRAMIDE HCL 5 MG/ML IJ SOLN: 5.0000 mg | Freq: Three times a day (TID) | INTRAMUSCULAR | Status: DC | PRN

## 2016-10-14 MED ORDER — MIDAZOLAM HCL 2 MG/2ML IJ SOLN
INTRAMUSCULAR | Status: AC
  Filled 2016-10-14: qty 2

## 2016-10-14 MED ORDER — EPHEDRINE SULFATE-NACL 50-0.9 MG/10ML-% IV SOSY
PREFILLED_SYRINGE | INTRAVENOUS | Status: DC | PRN
  Administered 2016-10-14: 10 mg via INTRAVENOUS
  Administered 2016-10-14: 15 mg via INTRAVENOUS
  Administered 2016-10-14: 10 mg via INTRAVENOUS

## 2016-10-14 MED ORDER — PHENYLEPHRINE 40 MCG/ML (10ML) SYRINGE FOR IV PUSH (FOR BLOOD PRESSURE SUPPORT)
PREFILLED_SYRINGE | INTRAVENOUS | Status: AC
  Filled 2016-10-14: qty 10

## 2016-10-14 MED ORDER — LABETALOL HCL 5 MG/ML IV SOLN
INTRAVENOUS | Status: AC
  Filled 2016-10-14: qty 12

## 2016-10-14 MED ORDER — MIDAZOLAM HCL 2 MG/2ML IJ SOLN: 2.0000 mg | Freq: Once | INTRAMUSCULAR | Status: DC

## 2016-10-14 MED ORDER — CEFAZOLIN SODIUM-DEXTROSE 2-4 GM/100ML-% IV SOLN
2.0000 g | INTRAVENOUS | Status: AC
  Administered 2016-10-14: 2 g via INTRAVENOUS
  Filled 2016-10-14: qty 100

## 2016-10-14 MED ORDER — MIDAZOLAM HCL 2 MG/2ML IJ SOLN
INTRAMUSCULAR | Status: AC
  Administered 2016-10-14: 2 mg
  Filled 2016-10-14: qty 2

## 2016-10-14 MED ORDER — HYDROMORPHONE HCL 1 MG/ML IJ SOLN
0.5000 mg | INTRAMUSCULAR | Status: DC | PRN
  Administered 2016-10-14: 0.5 mg via INTRAVENOUS
  Filled 2016-10-14: qty 0.5

## 2016-10-14 MED ORDER — FOLIC ACID 1 MG PO TABS
1.0000 mg | ORAL_TABLET | Freq: Every day | ORAL | Status: DC
  Administered 2016-10-14 – 2016-10-15 (×2): 1 mg via ORAL
  Filled 2016-10-14 (×2): qty 1

## 2016-10-14 MED ORDER — MENTHOL 3 MG MT LOZG: 1.0000 | LOZENGE | OROMUCOSAL | Status: DC | PRN

## 2016-10-14 MED ORDER — LACTATED RINGERS IV SOLN
INTRAVENOUS | Status: DC
  Administered 2016-10-14 (×2): via INTRAVENOUS

## 2016-10-14 MED ORDER — SODIUM CHLORIDE 0.9 % IV SOLN
INTRAVENOUS | Status: DC
Start: 2016-10-14 — End: 2016-10-15
  Administered 2016-10-14: 75 mL/h via INTRAVENOUS

## 2016-10-14 MED ORDER — FENTANYL CITRATE (PF) 100 MCG/2ML IJ SOLN: 25.0000 ug | INTRAMUSCULAR | Status: DC | PRN

## 2016-10-14 MED ORDER — FENTANYL CITRATE (PF) 100 MCG/2ML IJ SOLN
INTRAMUSCULAR | Status: AC
  Administered 2016-10-14: 50 ug
  Filled 2016-10-14: qty 2

## 2016-10-14 MED ORDER — PHENOL 1.4 % MT LIQD: 1.0000 | OROMUCOSAL | Status: DC | PRN

## 2016-10-14 MED ORDER — ROPIVACAINE HCL 7.5 MG/ML IJ SOLN
INTRAMUSCULAR | Status: DC | PRN
Start: 2016-10-14 — End: 2016-10-14
  Administered 2016-10-14: 20 mL via PERINEURAL

## 2016-10-14 MED ORDER — ADULT MULTIVITAMIN W/MINERALS CH
1.0000 | ORAL_TABLET | Freq: Every day | ORAL | Status: DC
  Administered 2016-10-14 – 2016-10-15 (×2): 1 via ORAL
  Filled 2016-10-14 (×2): qty 1

## 2016-10-14 MED ORDER — METOCLOPRAMIDE HCL 5 MG PO TABS: 5.0000 mg | ORAL_TABLET | Freq: Three times a day (TID) | ORAL | Status: DC | PRN

## 2016-10-14 MED ORDER — ONDANSETRON HCL 4 MG/2ML IJ SOLN: 4.0000 mg | Freq: Four times a day (QID) | INTRAMUSCULAR | Status: DC | PRN

## 2016-10-14 MED ORDER — ACETAMINOPHEN 650 MG RE SUPP: 650.0000 mg | Freq: Four times a day (QID) | RECTAL | Status: DC | PRN

## 2016-10-14 MED ORDER — PROPRANOLOL HCL 20 MG PO TABS
20.0000 mg | ORAL_TABLET | Freq: Two times a day (BID) | ORAL | Status: DC
  Administered 2016-10-14 – 2016-10-15 (×2): 20 mg via ORAL
  Filled 2016-10-14 (×2): qty 1

## 2016-10-14 MED ORDER — PROPOFOL 10 MG/ML IV BOLUS
INTRAVENOUS | Status: AC
  Filled 2016-10-14: qty 20

## 2016-10-14 MED ORDER — PHENYLEPHRINE 40 MCG/ML (10ML) SYRINGE FOR IV PUSH (FOR BLOOD PRESSURE SUPPORT)
PREFILLED_SYRINGE | INTRAVENOUS | Status: DC | PRN
  Administered 2016-10-14 (×4): 80 ug via INTRAVENOUS

## 2016-10-14 MED ORDER — CEFAZOLIN SODIUM-DEXTROSE 1-4 GM/50ML-% IV SOLN
1.0000 g | Freq: Four times a day (QID) | INTRAVENOUS | Status: AC
  Administered 2016-10-14: 1 g via INTRAVENOUS
  Filled 2016-10-14: qty 50

## 2016-10-14 MED ORDER — LIDOCAINE 2% (20 MG/ML) 5 ML SYRINGE
INTRAMUSCULAR | Status: DC | PRN
  Administered 2016-10-14: 60 mg via INTRAVENOUS

## 2016-10-14 MED ORDER — PROPOFOL 10 MG/ML IV BOLUS
INTRAVENOUS | Status: DC | PRN
  Administered 2016-10-14: 130 mg via INTRAVENOUS
  Administered 2016-10-14: 50 mg via INTRAVENOUS

## 2016-10-14 MED ORDER — LIDOCAINE 2% (20 MG/ML) 5 ML SYRINGE
INTRAMUSCULAR | Status: AC
  Filled 2016-10-14: qty 10

## 2016-10-14 MED ORDER — OXYCODONE HCL 5 MG PO TABS
5.0000 mg | ORAL_TABLET | Freq: Four times a day (QID) | ORAL | Status: DC | PRN
  Administered 2016-10-15 (×2): 5 mg via ORAL
  Filled 2016-10-14 (×2): qty 1

## 2016-10-14 SURGICAL SUPPLY — 51 items
BENZOIN TINCTURE PRP APPL 2/3 (GAUZE/BANDAGES/DRESSINGS) ×3 IMPLANT
CLOSURE WOUND 1/2 X4 (GAUZE/BANDAGES/DRESSINGS) ×1
COVER SURGICAL LIGHT HANDLE (MISCELLANEOUS) ×3 IMPLANT
DRAPE C-ARM 42X72 X-RAY (DRAPES) ×3 IMPLANT
DRAPE IMP U-DRAPE 54X76 (DRAPES) ×3 IMPLANT
DRAPE INCISE IOBAN 66X45 STRL (DRAPES) IMPLANT
DRAPE SURG 17X23 STRL (DRAPES) ×3 IMPLANT
DRAPE U-SHAPE 47X51 STRL (DRAPES) ×3 IMPLANT
DRSG ADAPTIC 3X8 NADH LF (GAUZE/BANDAGES/DRESSINGS) ×3 IMPLANT
DRSG EMULSION OIL 3X3 NADH (GAUZE/BANDAGES/DRESSINGS) ×3 IMPLANT
ELECT REM PT RETURN 9FT ADLT (ELECTROSURGICAL) ×3
ELECTRODE REM PT RTRN 9FT ADLT (ELECTROSURGICAL) ×1 IMPLANT
GAUZE SPONGE 4X4 12PLY STRL (GAUZE/BANDAGES/DRESSINGS) ×3 IMPLANT
GAUZE SPONGE 4X4 12PLY STRL LF (GAUZE/BANDAGES/DRESSINGS) ×3 IMPLANT
GLOVE BIOGEL PI IND STRL 7.0 (GLOVE) ×1 IMPLANT
GLOVE BIOGEL PI IND STRL 8 (GLOVE) ×2 IMPLANT
GLOVE BIOGEL PI INDICATOR 7.0 (GLOVE) ×2
GLOVE BIOGEL PI INDICATOR 8 (GLOVE) ×4
GLOVE ORTHO TXT STRL SZ7.5 (GLOVE) ×6 IMPLANT
GLOVE SURG SS PI 6.5 STRL IVOR (GLOVE) ×3 IMPLANT
GOWN STRL REUS W/ TWL LRG LVL3 (GOWN DISPOSABLE) ×1 IMPLANT
GOWN STRL REUS W/ TWL XL LVL3 (GOWN DISPOSABLE) ×1 IMPLANT
GOWN STRL REUS W/TWL 2XL LVL3 (GOWN DISPOSABLE) ×3 IMPLANT
GOWN STRL REUS W/TWL LRG LVL3 (GOWN DISPOSABLE) ×2
GOWN STRL REUS W/TWL XL LVL3 (GOWN DISPOSABLE) ×2
KIT BASIN OR (CUSTOM PROCEDURE TRAY) ×3 IMPLANT
KIT ROOM TURNOVER OR (KITS) ×3 IMPLANT
MANIFOLD NEPTUNE II (INSTRUMENTS) ×3 IMPLANT
NEEDLE HYPO 25GX1X1/2 BEV (NEEDLE) IMPLANT
NS IRRIG 1000ML POUR BTL (IV SOLUTION) ×3 IMPLANT
PACK SHOULDER (CUSTOM PROCEDURE TRAY) ×3 IMPLANT
PACK UNIVERSAL I (CUSTOM PROCEDURE TRAY) ×3 IMPLANT
PAD ABD 8X10 STRL (GAUZE/BANDAGES/DRESSINGS) ×3 IMPLANT
PAD ARMBOARD 7.5X6 YLW CONV (MISCELLANEOUS) ×6 IMPLANT
SLING ARM IMMOBILIZER LRG (SOFTGOODS) ×3 IMPLANT
SPONGE LAP 18X18 X RAY DECT (DISPOSABLE) ×6 IMPLANT
STAPLER VISISTAT 35W (STAPLE) ×3 IMPLANT
STRIP CLOSURE SKIN 1/2X4 (GAUZE/BANDAGES/DRESSINGS) ×2 IMPLANT
SUCTION FRAZIER HANDLE 10FR (MISCELLANEOUS) ×2
SUCTION TUBE FRAZIER 10FR DISP (MISCELLANEOUS) ×1 IMPLANT
SUT FIBERWIRE #2 38 T-5 BLUE (SUTURE)
SUT MERSILENE 5MM BP 1 12 (SUTURE) ×9 IMPLANT
SUT VIC AB 2-0 CT1 27 (SUTURE) ×2
SUT VIC AB 2-0 CT1 TAPERPNT 27 (SUTURE) ×1 IMPLANT
SUT VIC AB 3-0 FS2 27 (SUTURE) ×3 IMPLANT
SUTURE FIBERWR #2 38 T-5 BLUE (SUTURE) IMPLANT
SUTURE TAPE 1.3 40 TPR END (SUTURE) ×1 IMPLANT
SUTURETAPE 1.3 40 TPR END (SUTURE) ×3
SYR CONTROL 10ML LL (SYRINGE) IMPLANT
TAPE CLOTH SURG 6X10 WHT LF (GAUZE/BANDAGES/DRESSINGS) ×3 IMPLANT
WATER STERILE IRR 1000ML POUR (IV SOLUTION) ×3 IMPLANT

## 2016-10-14 NOTE — Progress Notes (Signed)
Notified PA of signs of withdraw (slight tremor, mild anxiety and agitation). Pt admitted to drinking 2 6packs of beer a day. Verbal order for CIWA protocol. Pt scored a 7 on CIWA in PACU, Notified [place]. Verbal order for 2mg  Versed. Given IV. Pt stable and will continue to monitor.

## 2016-10-14 NOTE — Interval H&P Note (Signed)
History and Physical Interval Note:  10/14/2016 5:37 PM  Lee Wang  has presented today for surgery, with the diagnosis of Left Shoulder Fractrue/Dislocation  The various methods of treatment have been discussed with the patient and family. After consideration of risks, benefits and other options for treatment, the patient has consented to  Procedure(s): OPEN REDUCTION INTERNAL FIXATION (ORIF) LEFT SHOULDER FRACTURE/DISLOCATION, OPEN REDUCTION INTERNAL FIXATION GREATER TUBEROSITY (Left) as a surgical intervention .  The patient's history has been reviewed, patient examined, no change in status, stable for surgery.  I have reviewed the patient's chart and labs.  Questions were answered to the patient's satisfaction.     Marybelle Killings

## 2016-10-14 NOTE — Anesthesia Procedure Notes (Signed)
Procedure Name: Intubation Date/Time: 10/14/2016 6:37 PM Performed by: Claris Che Pre-anesthesia Checklist: Patient identified, Emergency Drugs available, Suction available, Patient being monitored and Timeout performed Patient Re-evaluated:Patient Re-evaluated prior to induction Oxygen Delivery Method: Circle system utilized Preoxygenation: Pre-oxygenation with 100% oxygen Induction Type: IV induction, Rapid sequence and Cricoid Pressure applied Ventilation: Mask ventilation without difficulty Laryngoscope Size: Mac and 4 Grade View: Grade II Tube type: Oral Tube size: 7.5 mm Number of attempts: 1 Airway Equipment and Method: Stylet Placement Confirmation: ETT inserted through vocal cords under direct vision,  positive ETCO2 and breath sounds checked- equal and bilateral Secured at: 22 cm Tube secured with: Tape Dental Injury: Teeth and Oropharynx as per pre-operative assessment

## 2016-10-14 NOTE — Progress Notes (Signed)
Dentures removed by patient, placed in pink denture container, placed in belongings bag and will be given to ex=wife.  DA

## 2016-10-14 NOTE — Anesthesia Procedure Notes (Signed)
Anesthesia Regional Block: Interscalene brachial plexus block   Pre-Anesthetic Checklist: ,, timeout performed, Correct Patient, Correct Site, Correct Laterality, Correct Procedure, Correct Position, site marked, Risks and benefits discussed,  Surgical consent,  Pre-op evaluation,  At surgeon's request and post-op pain management  Laterality: Left  Prep: chloraprep       Needles:  Injection technique: Single-shot  Needle Type: Echogenic Stimulator Needle     Needle Length: 5cm  Needle Gauge: 22     Additional Needles:   Procedures: ultrasound guided, nerve stimulator,,,,,,  Narrative:  Start time: 10/14/2016 4:10 PM End time: 10/14/2016 4:21 PM Injection made incrementally with aspirations every 5 mL.  Performed by: Personally  Anesthesiologist: Duane Boston  Additional Notes: Functioning IV was confirmed and monitors were applied.  A 20mm 22ga Arrow echogenic stimulator needle was used. Sterile prep and drape,hand hygiene and sterile gloves were used. Ultrasound guidance: relevant anatomy identified, needle position confirmed, local anesthetic spread visualized around nerve(s)., vascular puncture avoided.  Image printed for medical record. Negative aspiration and negative test dose prior to incremental administration of local anesthetic. The patient tolerated the procedure well.

## 2016-10-14 NOTE — Anesthesia Preprocedure Evaluation (Addendum)
Anesthesia Evaluation  Patient identified by MRN, date of birth, ID band Patient awake    Reviewed: Allergy & Precautions, H&P , NPO status , Patient's Chart, lab work & pertinent test results, reviewed documented beta blocker date and time   Airway Mallampati: I  TM Distance: >3 FB Neck ROM: Full    Dental  (+) Edentulous Upper, Edentulous Lower   Pulmonary Current Smoker,    breath sounds clear to auscultation       Cardiovascular hypertension, Pt. on home beta blockers and Pt. on medications  Rhythm:Regular Rate:Normal     Neuro/Psych PSYCHIATRIC DISORDERS Anxiety Depression    GI/Hepatic (+)     substance abuse  alcohol use,   Endo/Other    Renal/GU      Musculoskeletal   Abdominal   Peds  Hematology   Anesthesia Other Findings   Reproductive/Obstetrics                             Anesthesia Physical  Anesthesia Plan  ASA: III  Anesthesia Plan: General   Post-op Pain Management: GA combined w/ Regional for post-op pain   Induction: Intravenous  PONV Risk Score and Plan: 1 and Ondansetron, Dexamethasone, Treatment may vary due to age or medical condition and Midazolam  Airway Management Planned: Oral ETT  Additional Equipment:   Intra-op Plan:   Post-operative Plan:   Informed Consent: I have reviewed the patients History and Physical, chart, labs and discussed the procedure including the risks, benefits and alternatives for the proposed anesthesia with the patient or authorized representative who has indicated his/her understanding and acceptance.     Plan Discussed with:   Anesthesia Plan Comments:        Anesthesia Quick Evaluation

## 2016-10-14 NOTE — Anesthesia Postprocedure Evaluation (Signed)
Anesthesia Post Note  Patient: Lee Wang  Procedure(s) Performed: Procedure(s) (LRB): OPEN REDUCTION INTERNAL FIXATION (ORIF) LEFT SHOULDER FRACTURE/DISLOCATION, OPEN REDUCTION INTERNAL FIXATION GREATER TUBEROSITY (Left)     Patient location during evaluation: PACU Anesthesia Type: General Level of consciousness: awake and alert Pain management: pain level controlled Vital Signs Assessment: post-procedure vital signs reviewed and stable Respiratory status: spontaneous breathing, nonlabored ventilation, respiratory function stable and patient connected to nasal cannula oxygen Cardiovascular status: blood pressure returned to baseline and stable Postop Assessment: no signs of nausea or vomiting Anesthetic complications: no    Last Vitals:  Vitals:   10/14/16 2015 10/14/16 2028  BP: 99/75 111/74  Pulse:    Resp: 12 15  Temp:    SpO2:      Last Pain:  Vitals:   10/14/16 2040  TempSrc:   PainSc: 0-No pain                 Malu Pellegrini S

## 2016-10-14 NOTE — Transfer of Care (Signed)
Immediate Anesthesia Transfer of Care Note  Patient: CASSIE HENKELS  Procedure(s) Performed: Procedure(s): OPEN REDUCTION INTERNAL FIXATION (ORIF) LEFT SHOULDER FRACTURE/DISLOCATION, OPEN REDUCTION INTERNAL FIXATION GREATER TUBEROSITY (Left)  Patient Location: PACU  Anesthesia Type:General and GA combined with regional for post-op pain  Level of Consciousness: sedated, drowsy, patient cooperative and responds to stimulation  Airway & Oxygen Therapy: Patient Spontanous Breathing and Patient connected to nasal cannula oxygen  Post-op Assessment: Report given to RN and Post -op Vital signs reviewed and stable  Post vital signs: Reviewed and stable  Last Vitals:  Vitals:   10/14/16 1507  BP: 132/76  Pulse: 65  Resp: 18  Temp: 36.5 C  SpO2: 97%    Last Pain:  Vitals:   10/14/16 1507  TempSrc: Oral         Complications: No apparent anesthesia complications

## 2016-10-14 NOTE — H&P (Signed)
Lee Wang is an 71 y.o. male.   Chief Complaint: Left shoulder pain HPI: Patient history of anterior shoulder dislocation with greater tuberosity fracture and the above complaint presents for surgical intervention. Failed conservative treatment.  Past Medical History:  Diagnosis Date  . Anxiety   . Arthritis   . Cancer [facility]) prostate   2013  . Depression   . Heavy drinker of alcohol   . Hyperlipidemia   . Hypertension     Past Surgical History:  Procedure Laterality Date  . CATARACT EXTRACTION W/PHACO Right 08/21/2013   Procedure: CATARACT EXTRACTION PHACO AND INTRAOCULAR LENS PLACEMENT (IOC);  Surgeon: Tonny Branch, MD;  Location: [facility];  Service: Ophthalmology;  Laterality: Right;  CDE 7.34  . CATARACT EXTRACTION W/PHACO Left 09/14/2013   Procedure: CATARACT EXTRACTION PHACO AND INTRAOCULAR LENS PLACEMENT (IOC);  Surgeon: Tonny Branch, MD;  Location: [facility];  Service: Ophthalmology;  Laterality: Left;  CDE: 11.50  . COLONOSCOPY  2011   Recc repeat 3 years external / anal canal hemorrhoids, otherwise normal rectum. 2. multiple colonic polyps with largest ulcerated pedunculated polyp in the midsigmoid, status post multiple  hot snare polypectomies. no evidence of colitis or diverticuliosis.   Marland Kitchen FRACTURE SURGERY Bilateral    ankle  . PROSTATE SURGERY     radiation implants    Family History  Problem Relation Age of Onset  . Alzheimer's disease Mother   . Heart disease Father   . Alzheimer's disease Paternal Grandfather   . Colon cancer Neg Hx   . Liver disease Neg Hx    Social History:  reports that he has been smoking Cigarettes.  He started smoking about 58 years ago. He has a 50.00 pack-year smoking history. His smokeless tobacco use includes Chew. He reports that he drinks about 6.0 oz of alcohol per week . He reports that he does not use drugs.  Allergies: No Known Allergies  No prescriptions prior to admission.    No results found for this or any previous visit (from  the past 48 hour(s)). No results found.  Review of Systems  Constitutional: Negative.   HENT: Negative.   Respiratory: Negative.   Cardiovascular: Negative.   Genitourinary: Negative.   Musculoskeletal: Positive for joint pain.  Skin: Negative.   Neurological: Negative.   Psychiatric/Behavioral: Negative.     There were no vitals taken for this visit. Physical Exam  Constitutional: He is oriented to person, place, and time. No distress.  HENT:  Head: Normocephalic and atraumatic.  Eyes: Pupils are equal, round, and reactive to light.  Neck: Normal range of motion.  Respiratory: No respiratory distress.  GI: He exhibits no distension.  Musculoskeletal:  Shoulder tender.  Neurological: He is alert and oriented to person, place, and time.  Skin: Skin is warm and dry.  Psychiatric: He has a normal mood and affect.     Assessment/Plan Left shoulder greater tuberosity fracture  We'll proceed with OPEN REDUCTION INTERNAL FIXATION (ORIF) LEFT SHOULDER FRACTURE/DISLOCATION, OPEN REDUCTION INTERNAL FIXATION GREATER TUBEROSITY as scheduled. Surgical procedure along with possible rehabilitation/recovery time discussed. All questions answered.  Benjiman Core, PA-C 10/14/2016, 12:42 PM

## 2016-10-15 ENCOUNTER — Encounter (HOSPITAL_COMMUNITY): Payer: Self-pay | Admitting: Orthopaedic Surgery

## 2016-10-15 DIAGNOSIS — I10 Essential (primary) hypertension: Secondary | ICD-10-CM | POA: Diagnosis not present

## 2016-10-15 DIAGNOSIS — S42252A Displaced fracture of greater tuberosity of left humerus, initial encounter for closed fracture: Secondary | ICD-10-CM | POA: Diagnosis not present

## 2016-10-15 DIAGNOSIS — S46212A Strain of muscle, fascia and tendon of other parts of biceps, left arm, initial encounter: Secondary | ICD-10-CM | POA: Diagnosis not present

## 2016-10-15 MED ORDER — OXYCODONE-ACETAMINOPHEN 5-325 MG PO TABS: 1.0000 | ORAL_TABLET | ORAL | 0 refills | Status: DC | PRN

## 2016-10-15 NOTE — Progress Notes (Addendum)
Pt BP=90/62 THIS AM. OOB BED TO BR WITH MIN ASSIST, ALERT ORIENTED, WITH SLOW VERBAL RESPONSES. NO IMBALANCES NOTED, APPEARS TO BE BASELINE. APPRAISED DR Lorin Mercy OF FINDINGS. ADVISED PT/FAMILY TO ABSTAIN FROM ETOH USE WHILE TAKING OXYCODONE AS NOTED IN MED SHEETS.

## 2016-10-15 NOTE — Progress Notes (Signed)
   Subjective: 1 Day Post-Op Procedure(s) (LRB): OPEN REDUCTION INTERNAL FIXATION (ORIF) LEFT SHOULDER FRACTURE/DISLOCATION, OPEN REDUCTION INTERNAL FIXATION GREATER TUBEROSITY (Left) Patient reports pain as mild.    Objective: Vital signs in last 24 hours: Temp:  [97.5 F (36.4 C)-98.5 F (36.9 C)] 98.5 F (36.9 C) (09/06 0434) Pulse Rate:  [65-88] 66 (09/06 0434) Resp:  [12-20] 20 (09/06 0434) BP: (99-132)/(63-83) 115/64 (09/06 0434) SpO2:  [93 %-98 %] 98 % (09/06 0434) Weight:  [135 lb 5.8 oz (61.4 kg)-136 lb (61.7 kg)] 135 lb 5.8 oz (61.4 kg) (09/05 2155)  Intake/Output from previous day: 09/05 0701 - 09/06 0700 In: 1400 [I.V.:1400] Out: 550 [Urine:450; Blood:100] Intake/Output this shift: No intake/output data recorded.   Recent Labs  10/14/16 1530  HGB 14.3    Recent Labs  10/14/16 1530  WBC 6.7  RBC 4.37  HCT 41.7  PLT 283    Recent Labs  10/14/16 1530  NA 133*  K 4.0  CL 97*  CO2 25  BUN 6  CREATININE 0.66  GLUCOSE 89  CALCIUM 9.8    Recent Labs  10/14/16 2145  INR 1.05    incision with some bloody drainage at bottom.  Dg Shoulder 1v Left  Result Date: 10/14/2016 CLINICAL DATA:  Left shoulder fracture. EXAM: LEFT SHOULDER - 1 VIEW; DG C-ARM 61-120 MIN COMPARISON:  Preoperative radiographs 10/08/2016 FINDINGS: Two fluoroscopic spot images of the left shoulder are performed in the operating room. Previous left shoulder dislocation has been reduced. Previous displaced fracture fragments not well seen fluoroscopically. Fluoroscopy time 16 seconds. IMPRESSION: Procedural fluoroscopy with reduction of prior shoulder dislocation. Previous displaced fracture fragment is not visualized on provided views. Electronically Signed   By: Jeb Levering M.D.   On: 10/14/2016 20:27   Dg C-arm 1-60 Min  Result Date: 10/14/2016 CLINICAL DATA:  Left shoulder fracture. EXAM: LEFT SHOULDER - 1 VIEW; DG C-ARM 61-120 MIN COMPARISON:  Preoperative radiographs  10/08/2016 FINDINGS: Two fluoroscopic spot images of the left shoulder are performed in the operating room. Previous left shoulder dislocation has been reduced. Previous displaced fracture fragments not well seen fluoroscopically. Fluoroscopy time 16 seconds. IMPRESSION: Procedural fluoroscopy with reduction of prior shoulder dislocation. Previous displaced fracture fragment is not visualized on provided views. Electronically Signed   By: Jeb Levering M.D.   On: 10/14/2016 20:27    Assessment/Plan: 1 Day Post-Op Procedure(s) (LRB): OPEN REDUCTION INTERNAL FIXATION (ORIF) LEFT SHOULDER FRACTURE/DISLOCATION, OPEN REDUCTION INTERNAL FIXATION GREATER TUBEROSITY (Left) Plan: discharge home   Lee Wang 10/15/2016, 7:31 AM

## 2016-10-15 NOTE — Op Note (Signed)
Preop diagnosis: Left shoulder anterior dislocation with greater tuberosity fracture.  Postop diagnosis: Same  Procedure: Open reduction of irreducible left anterior shoulder dislocation with open reduction internal fixation of greater tuberosity fracture.  Surgeon: Rodell Perna M.D.  Anesthesia preoperative block plus general anesthesia.  EBL: See anesthetic record.  History 71 year old male with significant alcohol history presented with a three-week history of shoulder dislocation. Attempt in the emergency room under conscious sedation 2 was unsuccessful by the ER staff at [facility] in [place]. Patient a greater tuberosity fracture and presents for open reduction under general anesthesia for irreducible fracture dislocation of the shoulder.  After induction of preoperative block general anesthesia operculum intubation with the general anesthesia and complete muscle paralysis and attempt to reduce the shoulder closed was performed and was unsuccessful with longitudinal traction and the foot in the axilla. Center prepping and draping was performed with patient in the beachchair position after timeout procedure which was done before the attempted closed reduction, Ancef had been given prophylactically in a deltopectoral approach was made after impervious stockinette Caban sterile skin marker and Betadine Steri-Drape had been applied sealing the skin. Cephalic vein was taken laterally with the deltoid. Biceps tendon was completely torn flattened and frayed. Subscap was partially released and tagged with sutures and it was difficult to identify the greater tuberosity fragment and then gradually mobilize it anterior and lateral. Bone was required and was difficult to reduce the shoulder bone was placed in the greater tuberosity fracture site and finally the shoulder was reduced back into the socket. Fragment of the greater tuberosity was insufficient in order to place a screw fixation even with a  spider washer which was available. 5 mm M[place]il[place] tapes were passed through the bone and then sutured back to the subscap which help reduce the fragment. C-arm was sterilely draped brought in and checked. Additional sutures were passed with #2 FiberWire suturing the greater tuberosity fragment down through holes in bone for additional securement. #2 FiberWire sutures were placed through the subscap for repair and additionally securing them to the greater tuberosity fragment. C-arm was used to confirm the shoulder was reduced. Deltopectoral approach was closed with reapproximation of the subtendinous tissue skin staple closure and application of shoulder immobilizer.

## 2016-10-15 NOTE — Progress Notes (Signed)
CSW acknowledging consult for nursing home placement; however, per MD note from this morning, plan for patient to return home.   CSW signing off.  Laveda Abbe, [place] Clinical Social Worker [phone]

## 2016-10-15 NOTE — Discharge Instructions (Signed)
°  How to Use a Shoulder Immobilizer A shoulder immobilizer is a device that you may have to wear after a shoulder injury or surgery. This device keeps your arm from moving. This prevents additional pain or injury. It also supports your arm next to your body as your shoulder heals. You may need to wear a shoulder immobilizer to treat a broken bone (fracture) in your shoulder. You may also need to wear one if you have an injury that moves your shoulder out of position (dislocation). There are different types of shoulder immobilizers. The one that you get depends on your injury. What are the risks? Wearing a shoulder immobilizer in the wrong way can let your injured shoulder move around too much. This may delay healing and make your pain and swelling worse. How to use your shoulder immobilizer  The part of the immobilizer that goes around your neck (sling) should support your upper arm, with your elbow bent and your lower arm and hand across your chest.  Make sure that your elbow: ? Is snug against the back pocket of the sling. ? Does not move away from your body.  The strap of the immobilizer should go over your shoulder and support your arm and hand. Your hand should be slightly higher than your elbow. It should not hang loosely over the edge of the sling.  If the long strap has a pad, place it where it is most comfortable on your neck.  Carefully follow your health care provider's instructions for wearing your shoulder immobilizer. Your health care provider may want you to: ? Loosen your immobilizer to straighten your elbow and move your wrist and fingers. You may have to do this several times each day. Ask your health care provider when you should do this and how often. ? Remove your immobilizer once every day to shower, but limit the movement in your injured arm. Before putting the immobilizer back on, use a towel to dry the area under your arm completely. ? Remove your immobilizer to do  shoulder exercises at home as directed by your health care provider. ? Wear your immobilizer while you sleep. You may sleep more comfortably if you have your upper body raised on pillows. Contact a health care provider if:  Your immobilizer is not supporting your arm properly.  Your immobilizer gets damaged.  You have worsening pain or swelling in your shoulder, arm, or hand.  Your shoulder, arm, or hand changes color or temperature.  You lose feeling in your shoulder, arm, or hand. This information is not intended to replace advice given to you by your health care provider. Make sure you discuss any questions you have with your health care provider. Document Released: 03/05/2004 Document Revised: 07/04/2015 Document Reviewed: 01/03/2014 Elsevier Interactive Patient Education  2018 [place] not remove shoulder immobilizer. Keep on at all times. See Dr. Lorin Mercy in one week. Use ice on shoulder off and on to help with pain. Do not remove sling ( shoulder immobilizer).

## 2016-10-15 NOTE — Care Management Obs Status (Signed)
[place] NOTIFICATION   Patient Details  Name: Lee Wang MRN: [id number] Date of Birth: 24-Sep-1945   Medicare Observation Status Notification Given:  Yes    Pollie Friar, RN 10/15/2016, 11:12 AM

## 2016-10-21 ENCOUNTER — Inpatient Hospital Stay (INDEPENDENT_AMBULATORY_CARE_PROVIDER_SITE_OTHER): Payer: Medicare Other | Admitting: Surgery

## 2016-10-29 ENCOUNTER — Ambulatory Visit (INDEPENDENT_AMBULATORY_CARE_PROVIDER_SITE_OTHER): Payer: Medicare Other

## 2016-10-29 ENCOUNTER — Ambulatory Visit (INDEPENDENT_AMBULATORY_CARE_PROVIDER_SITE_OTHER): Payer: Medicare Other | Admitting: Orthopaedic Surgery

## 2016-10-29 DIAGNOSIS — S4292XD Fracture of left shoulder girdle, part unspecified, subsequent encounter for fracture with routine healing: Secondary | ICD-10-CM

## 2016-10-29 DIAGNOSIS — S42252D Displaced fracture of greater tuberosity of left humerus, subsequent encounter for fracture with routine healing: Secondary | ICD-10-CM

## 2016-10-29 NOTE — Progress Notes (Signed)
Office Visit Note   Patient: Lee Wang           Date of Birth: 11-26-1945           MRN: [id number] Visit Date: 10/29/2016              Requested by: Dione Housekeeper, MD [address] [place], [place] [address] PCP: Dione Housekeeper, MD   Assessment & Plan: Visit Diagnoses:  1. Closed fracture dislocation of joint of left shoulder girdle with routine healing, subsequent encounter   2. Closed displaced fracture of greater tuberosity of left humerus with routine healing, subsequent encounter     Plan: I told patient needs to go home but his sling on keep it on as instructed return 4 weeks and we will start some therapy he's been drinking fairly heavily daily and I've asked him to cut back. Turn 4 weeks.  Follow-Up Instructions: No Follow-up on file.   Orders:  Orders Placed This Encounter  Procedures  . XR Shoulder Left   No orders of the defined types were placed in this encounter.     Procedures: No procedures performed   Clinical Data: No additional findings.   Subjective: Chief Complaint  Patient presents with  . Left Shoulder - Routine Post Op    HPI postop left shoulder ORIF fracture dislocation with ORIF greater tuberosity. Incision is well-healed. He is not wearing his sling. I encouraged him for a sling back on recheck him again in 4 weeks.  Review of Systems Unchanged from surgery.  Objective: Vital Signs: There were no vitals taken for this visit.  Physical Exam postop incisions well-healed. Minimal swelling in the hand. Shoulder with rotation appears reduced.  Ortho Exam  Specialty Comments:  No specialty comments available.  Imaging: Xr Shoulder Left  Result Date: 10/29/2016 Two-view x-rays left shoulder obtained. This shows the tuberosity fracture post fixation. There is slight inferior position but the shoulder appears reduced. Impression post open reduction internal fixation fracture dislocation left shoulder.    PMFS  History: Patient Active Problem List   Diagnosis Date Noted  . Greater tuberosity of humerus fracture 10/14/2016  . Closed fracture dislocation of joint of left shoulder girdle 10/08/2016  . GAD (generalized anxiety disorder) 03/09/2015  . Elevated LFTs 11/05/2014  . Abnormal [place] (ultrasound) of abdomen 11/05/2014  . Hepatomegaly 11/05/2014  . History of colonic polyps 11/05/2014  . HTN (hypertension) 05/28/2010  . Hyperlipemia 05/28/2010  . Depression with anxiety 05/28/2010  . Prostate CA ([place]) 05/28/2010  . BPH (benign prostatic hyperplasia) 05/28/2010  . Alcohol abuse 07/18/2009  . HEMATOCHEZIA 07/18/2009  . DIARRHEA 07/18/2009  . ABDOMINAL PAIN, UNSPECIFIED SITE 07/18/2009   Past Medical History:  Diagnosis Date  . Anxiety   . Arthritis   . Cancer [facility]) prostate   2013  . Depression   . Heavy drinker of alcohol   . Hyperlipidemia   . Hypertension     Family History  Problem Relation Age of Onset  . Alzheimer's disease Mother   . Heart disease Father   . Alzheimer's disease Paternal Grandfather   . Colon cancer Neg Hx   . Liver disease Neg Hx     Past Surgical History:  Procedure Laterality Date  . CATARACT EXTRACTION W/PHACO Right 08/21/2013   Procedure: CATARACT EXTRACTION PHACO AND INTRAOCULAR LENS PLACEMENT (IOC);  Surgeon: Tonny Branch, MD;  Location: [facility];  Service: Ophthalmology;  Laterality: Right;  CDE 7.34  . CATARACT EXTRACTION W/PHACO Left 09/14/2013  Procedure: CATARACT EXTRACTION PHACO AND INTRAOCULAR LENS PLACEMENT (IOC);  Surgeon: Tonny Branch, MD;  Location: [facility];  Service: Ophthalmology;  Laterality: Left;  CDE: 11.50  . COLONOSCOPY  2011   Recc repeat 3 years external / anal canal hemorrhoids, otherwise normal rectum. 2. multiple colonic polyps with largest ulcerated pedunculated polyp in the midsigmoid, status post multiple  hot snare polypectomies. no evidence of colitis or diverticuliosis.   Marland Kitchen FRACTURE SURGERY Bilateral    ankle  . ORIF HUMERUS  FRACTURE Left 10/14/2016   Procedure: OPEN REDUCTION INTERNAL FIXATION (ORIF) LEFT SHOULDER FRACTURE/DISLOCATION, OPEN REDUCTION INTERNAL FIXATION GREATER TUBEROSITY;  Surgeon: Marybelle Killings, MD;  Location: [facility];  Service: Orthopedics;  Laterality: Left;  . PROSTATE SURGERY     radiation implants   Social History   Occupational History  . Not on file.   Social History Main Topics  . Smoking status: Current Every Day Smoker    Packs/day: 1.00    Years: 50.00    Types: Cigarettes    Start date: 08/09/1958  . Smokeless tobacco: Current User    Types: Chew  . Alcohol use 6.0 oz/week    10 Standard drinks or equivalent per week     Comment: drinks at least once a week, minimum 6 drinks per occurrence.  . Drug use: No  . Sexual activity: Not on file

## 2016-11-03 NOTE — Discharge Summary (Signed)
Patient ID: Lee Wang MRN: [id number] DOB/AGE: [birth year] 71 y.o.  Admit date: 10/14/2016 Discharge date: 11/03/2016  Admission Diagnoses:  Active Problems:   Greater tuberosity of humerus fracture   Discharge Diagnoses:  Active Problems:   Greater tuberosity of humerus fracture  status post Procedure(s): OPEN REDUCTION INTERNAL FIXATION (ORIF) LEFT SHOULDER FRACTURE/DISLOCATION, OPEN REDUCTION INTERNAL FIXATION GREATER TUBEROSITY  Past Medical History:  Diagnosis Date  . Anxiety   . Arthritis   . Cancer [facility]) prostate   2013  . Depression   . Heavy drinker of alcohol   . Hyperlipidemia   . Hypertension     Surgeries: Procedure(s): OPEN REDUCTION INTERNAL FIXATION (ORIF) LEFT SHOULDER FRACTURE/DISLOCATION, OPEN REDUCTION INTERNAL FIXATION GREATER TUBEROSITY on 10/14/2016   Consultants:   Discharged Condition: Improved  Hospital Course: Lee Wang is an 71 y.o. male who was admitted 10/14/2016 for operative treatment of greater tuberosity fracture. Patient failed conservative treatments (please see the history and physical for the specifics) and had severe unremitting pain that affects sleep, daily activities and work/hobbies. After pre-op clearance, the patient was taken to the operating room on 10/14/2016 and underwent  Procedure(s): OPEN REDUCTION INTERNAL FIXATION (ORIF) LEFT SHOULDER FRACTURE/DISLOCATION, OPEN REDUCTION INTERNAL FIXATION GREATER TUBEROSITY.    Patient was given perioperative antibiotics:  Anti-infectives    Start     Dose/Rate Route Frequency Ordered Stop   10/15/16 0600  ceFAZolin (ANCEF) IVPB 2g/100 mL premix     2 g 200 mL/hr over 30 Minutes Intravenous On call to O.R. 10/14/16 1455 10/14/16 1838   10/15/16 0030  ceFAZolin (ANCEF) IVPB 1 g/50 mL premix     1 g 100 mL/hr over 30 Minutes Intravenous Every 6 hours 10/14/16 2148 10/15/16 0026       Patient was given sequential compression devices and early ambulation to prevent DVT.    Patient benefited maximally from hospital stay and there were no complications. At the time of discharge, the patient was urinating/moving their bowels without difficulty, tolerating a regular diet, pain is controlled with oral pain medications and they have been cleared by PT/OT.   Recent vital signs: No data found.    Recent laboratory studies: No results for input(s): WBC, HGB, HCT, PLT, NA, K, CL, CO2, BUN, CREATININE, GLUCOSE, INR, CALCIUM in the last 72 hours.  Invalid input(s): PT, 2   Discharge Medications:   Allergies as of 10/15/2016   No Known Allergies     Medication List    STOP taking these medications   traMADol 50 MG tablet Commonly known as:  ULTRAM     TAKE these medications   ALPRAZolam 0.5 MG tablet Commonly known as:  XANAX Take 1 tablet (0.5 mg total) by mouth 2 (two) times daily as needed. for anxiety What changed:  reasons to take this  additional instructions   aspirin 325 MG EC tablet Take 650 mg by mouth daily as needed for pain.   atorvastatin 40 MG tablet Commonly known as:  LIPITOR Take 1 tablet (40 mg total) by mouth daily. What changed:  when to take this   escitalopram 10 MG tablet Commonly known as:  LEXAPRO Take 1 tablet (10 mg total) by mouth daily.   Fish Oil Oil Take 1 capsule by mouth daily. Reported on 03/08/2015   oxyCODONE-acetaminophen 5-325 MG tablet Commonly known as:  ROXICET Take 1 tablet by mouth every 4 (four) hours as needed for severe pain.   propranolol 20 MG tablet Commonly known as:  INDERAL  Take 1 tablet (20 mg total) by mouth 3 (three) times daily. What changed:  when to take this            Discharge Care Instructions        Start     Ordered   10/15/16 0000  oxyCODONE-acetaminophen (ROXICET) 5-325 MG tablet  Every 4 hours PRN     10/15/16 0734      Diagnostic Studies: Dg Shoulder 1v Left  Result Date: 10/14/2016 CLINICAL DATA:  Left shoulder fracture. EXAM: LEFT SHOULDER - 1 VIEW; DG  C-ARM 61-120 MIN COMPARISON:  Preoperative radiographs 10/08/2016 FINDINGS: Two fluoroscopic spot images of the left shoulder are performed in the operating room. Previous left shoulder dislocation has been reduced. Previous displaced fracture fragments not well seen fluoroscopically. Fluoroscopy time 16 seconds. IMPRESSION: Procedural fluoroscopy with reduction of prior shoulder dislocation. Previous displaced fracture fragment is not visualized on provided views. Electronically Signed   By: Jeb Levering M.D.   On: 10/14/2016 20:27   Dg C-arm 1-60 Min  Result Date: 10/14/2016 CLINICAL DATA:  Left shoulder fracture. EXAM: LEFT SHOULDER - 1 VIEW; DG C-ARM 61-120 MIN COMPARISON:  Preoperative radiographs 10/08/2016 FINDINGS: Two fluoroscopic spot images of the left shoulder are performed in the operating room. Previous left shoulder dislocation has been reduced. Previous displaced fracture fragments not well seen fluoroscopically. Fluoroscopy time 16 seconds. IMPRESSION: Procedural fluoroscopy with reduction of prior shoulder dislocation. Previous displaced fracture fragment is not visualized on provided views. Electronically Signed   By: Jeb Levering M.D.   On: 10/14/2016 20:27   Xr Humerus Left  Result Date: 10/08/2016 Three-view x-rays left femur shows anterior shoulder dislocation with greater tuberosity fracture. Impression: Fracture dislocation of the humerus. Glenohumeral joint is dislocated.  Xr Shoulder Left  Result Date: 10/29/2016 Two-view x-rays left shoulder obtained. This shows the tuberosity fracture post fixation. There is slight inferior position but the shoulder appears reduced. Impression post open reduction internal fixation fracture dislocation left shoulder.     Follow-up Information    Marybelle Killings, MD Follow up in 1 week(s).   Specialty:  Orthopedic Surgery Contact information: [place] [address] [phone]           Discharge  Plan:  discharge to home  Disposition:     Signed: Benjiman Core  11/03/2016, 11:05 AM

## 2016-11-26 ENCOUNTER — Ambulatory Visit (INDEPENDENT_AMBULATORY_CARE_PROVIDER_SITE_OTHER): Payer: Medicare Other | Admitting: Orthopaedic Surgery

## 2017-01-14 ENCOUNTER — Telehealth (INDEPENDENT_AMBULATORY_CARE_PROVIDER_SITE_OTHER): Payer: Self-pay | Admitting: Orthopaedic Surgery

## 2017-01-14 NOTE — Telephone Encounter (Signed)
Lee Wang came in with paperwork from [facility] [employer] Public [employer] that needs to be signed by Dr. Lorin Mercy for Lee Wang stating that he is unable to stay in jail for a long period of time due to his health condition. She stated that dr. Lorin Mercy understood his health condition since he did a shoulder surgery back in September. The form needs to be signed and faxed to [facility] # [phone].   If you are unable to do this please call Glenda back at [phone]

## 2017-01-15 NOTE — Telephone Encounter (Signed)
I called and left voicemail for Lee Wang. The form that she dropped off is a signed consent for [place] to communicate with [facility].  I advised that we are unable to communicate with the probation office and that I will put form back at front desk for her to pick up in case she would like to take to their office.

## 2017-03-04 DIAGNOSIS — R911 Solitary pulmonary nodule: Secondary | ICD-10-CM | POA: Diagnosis not present

## 2017-03-04 DIAGNOSIS — M542 Cervicalgia: Secondary | ICD-10-CM | POA: Diagnosis not present

## 2017-03-04 DIAGNOSIS — Z043 Encounter for examination and observation following other accident: Secondary | ICD-10-CM | POA: Diagnosis not present

## 2017-03-04 DIAGNOSIS — E871 Hypo-osmolality and hyponatremia: Secondary | ICD-10-CM | POA: Diagnosis not present

## 2017-03-04 DIAGNOSIS — S32018A Other fracture of first lumbar vertebra, initial encounter for closed fracture: Secondary | ICD-10-CM | POA: Diagnosis not present

## 2017-03-04 DIAGNOSIS — D649 Anemia, unspecified: Secondary | ICD-10-CM | POA: Diagnosis not present

## 2017-03-04 DIAGNOSIS — S2242XA Multiple fractures of ribs, left side, initial encounter for closed fracture: Secondary | ICD-10-CM | POA: Diagnosis not present

## 2017-03-04 DIAGNOSIS — S022XXA Fracture of nasal bones, initial encounter for closed fracture: Secondary | ICD-10-CM | POA: Diagnosis not present

## 2017-03-04 DIAGNOSIS — R0789 Other chest pain: Secondary | ICD-10-CM | POA: Diagnosis not present

## 2017-03-04 DIAGNOSIS — G8911 Acute pain due to trauma: Secondary | ICD-10-CM | POA: Diagnosis not present

## 2017-03-04 DIAGNOSIS — G44309 Post-traumatic headache, unspecified, not intractable: Secondary | ICD-10-CM | POA: Diagnosis not present

## 2017-03-04 DIAGNOSIS — E7800 Pure hypercholesterolemia, unspecified: Secondary | ICD-10-CM | POA: Diagnosis not present

## 2017-03-04 DIAGNOSIS — R918 Other nonspecific abnormal finding of lung field: Secondary | ICD-10-CM | POA: Diagnosis not present

## 2017-03-04 DIAGNOSIS — I10 Essential (primary) hypertension: Secondary | ICD-10-CM | POA: Diagnosis not present

## 2017-03-04 DIAGNOSIS — Z79899 Other long term (current) drug therapy: Secondary | ICD-10-CM | POA: Diagnosis not present

## 2017-03-04 DIAGNOSIS — R079 Chest pain, unspecified: Secondary | ICD-10-CM | POA: Diagnosis not present

## 2017-03-04 DIAGNOSIS — S0181XA Laceration without foreign body of other part of head, initial encounter: Secondary | ICD-10-CM | POA: Diagnosis not present

## 2017-03-04 DIAGNOSIS — R0689 Other abnormalities of breathing: Secondary | ICD-10-CM | POA: Diagnosis not present

## 2017-03-04 DIAGNOSIS — I998 Other disorder of circulatory system: Secondary | ICD-10-CM | POA: Diagnosis not present

## 2017-03-04 DIAGNOSIS — S22088A Other fracture of T11-T12 vertebra, initial encounter for closed fracture: Secondary | ICD-10-CM | POA: Diagnosis not present

## 2017-03-04 DIAGNOSIS — S32010A Wedge compression fracture of first lumbar vertebra, initial encounter for closed fracture: Secondary | ICD-10-CM | POA: Diagnosis not present

## 2017-03-04 DIAGNOSIS — S32020A Wedge compression fracture of second lumbar vertebra, initial encounter for closed fracture: Secondary | ICD-10-CM | POA: Diagnosis not present

## 2017-03-04 DIAGNOSIS — R402413 Glasgow coma scale score 13-15, at hospital admission: Secondary | ICD-10-CM | POA: Diagnosis not present

## 2017-03-04 DIAGNOSIS — S59912A Unspecified injury of left forearm, initial encounter: Secondary | ICD-10-CM | POA: Diagnosis not present

## 2017-03-04 DIAGNOSIS — S0990XA Unspecified injury of head, initial encounter: Secondary | ICD-10-CM | POA: Diagnosis not present

## 2017-03-04 DIAGNOSIS — S32028A Other fracture of second lumbar vertebra, initial encounter for closed fracture: Secondary | ICD-10-CM | POA: Diagnosis not present

## 2017-03-04 DIAGNOSIS — E876 Hypokalemia: Secondary | ICD-10-CM | POA: Diagnosis not present

## 2017-03-04 DIAGNOSIS — S22080A Wedge compression fracture of T11-T12 vertebra, initial encounter for closed fracture: Secondary | ICD-10-CM | POA: Diagnosis not present

## 2017-03-09 MED ORDER — MUPIROCIN 2 % EX OINT
TOPICAL_OINTMENT | CUTANEOUS | Status: DC
Start: 2017-03-09 — End: 2017-03-09

## 2017-03-09 MED ORDER — GENERIC EXTERNAL MEDICATION
1.00 | Status: DC
Start: ? — End: 2017-03-09

## 2017-03-09 MED ORDER — NICOTINE 7 MG/24HR TD PT24
1.00 | MEDICATED_PATCH | TRANSDERMAL | Status: DC
Start: 2017-03-10 — End: 2017-03-09

## 2017-03-09 MED ORDER — HYDROCODONE-ACETAMINOPHEN 5-325 MG PO TABS
1.00 | ORAL_TABLET | ORAL | Status: DC
Start: ? — End: 2017-03-09

## 2017-03-09 MED ORDER — ROSUVASTATIN CALCIUM 5 MG PO TABS
10.00 | ORAL_TABLET | ORAL | Status: DC
Start: 2017-03-09 — End: 2017-03-09

## 2017-03-09 MED ORDER — POLYETHYLENE GLYCOL 3350 17 G PO PACK
17.00 g | PACK | ORAL | Status: DC
Start: 2017-03-10 — End: 2017-03-09

## 2017-03-09 MED ORDER — SENNOSIDES-DOCUSATE SODIUM 8.6-50 MG PO TABS
2.00 | ORAL_TABLET | ORAL | Status: DC
Start: 2017-03-09 — End: 2017-03-09

## 2017-03-09 MED ORDER — PROPRANOLOL HCL 10 MG PO TABS
10.00 | ORAL_TABLET | ORAL | Status: DC
Start: 2017-03-09 — End: 2017-03-09

## 2017-03-09 MED ORDER — ACETAMINOPHEN 325 MG PO TABS
325.00 | ORAL_TABLET | ORAL | Status: DC
Start: 2017-03-09 — End: 2017-03-09

## 2017-03-09 MED ORDER — LORAZEPAM 1 MG PO TABS
2.00 | ORAL_TABLET | ORAL | Status: DC
Start: ? — End: 2017-03-09

## 2017-03-09 MED ORDER — THIAMINE HCL 100 MG PO TABS
100.00 | ORAL_TABLET | ORAL | Status: DC
Start: 2017-03-10 — End: 2017-03-09

## 2017-03-09 MED ORDER — GENERIC EXTERNAL MEDICATION
1.00 | Status: DC
Start: 2017-03-10 — End: 2017-03-09

## 2017-03-09 MED ORDER — ALPRAZOLAM 0.25 MG PO TABS
0.25 | ORAL_TABLET | ORAL | Status: DC
Start: ? — End: 2017-03-09

## 2017-03-09 MED ORDER — ESCITALOPRAM OXALATE 10 MG PO TABS
10.00 | ORAL_TABLET | ORAL | Status: DC
Start: 2017-03-10 — End: 2017-03-09

## 2017-03-09 MED ORDER — FOLIC ACID 1 MG PO TABS
1.00 | ORAL_TABLET | ORAL | Status: DC
Start: 2017-03-10 — End: 2017-03-09

## 2017-03-09 MED ORDER — ENOXAPARIN SODIUM 30 MG/0.3ML ~~LOC~~ SOLN
30.00 | SUBCUTANEOUS | Status: DC
Start: 2017-03-09 — End: 2017-03-09

## 2017-03-09 MED ORDER — TRAMADOL HCL 50 MG PO TABS
50.00 | ORAL_TABLET | ORAL | Status: DC
Start: ? — End: 2017-03-09

## 2017-03-09 MED ORDER — SODIUM CHLORIDE 1 G PO TABS
1000.00 | ORAL_TABLET | ORAL | Status: DC
Start: 2017-03-09 — End: 2017-03-09

## 2017-03-23 DIAGNOSIS — M5136 Other intervertebral disc degeneration, lumbar region: Secondary | ICD-10-CM | POA: Diagnosis not present

## 2017-03-23 DIAGNOSIS — M8588 Other specified disorders of bone density and structure, other site: Secondary | ICD-10-CM | POA: Diagnosis not present

## 2017-03-23 DIAGNOSIS — S32010D Wedge compression fracture of first lumbar vertebra, subsequent encounter for fracture with routine healing: Secondary | ICD-10-CM | POA: Diagnosis not present

## 2017-03-23 DIAGNOSIS — S022XXA Fracture of nasal bones, initial encounter for closed fracture: Secondary | ICD-10-CM | POA: Diagnosis not present

## 2017-03-23 DIAGNOSIS — S022XXD Fracture of nasal bones, subsequent encounter for fracture with routine healing: Secondary | ICD-10-CM | POA: Diagnosis not present

## 2017-03-23 DIAGNOSIS — M4186 Other forms of scoliosis, lumbar region: Secondary | ICD-10-CM | POA: Diagnosis not present

## 2017-03-23 DIAGNOSIS — S22080D Wedge compression fracture of T11-T12 vertebra, subsequent encounter for fracture with routine healing: Secondary | ICD-10-CM | POA: Diagnosis not present

## 2017-03-23 DIAGNOSIS — M8580 Other specified disorders of bone density and structure, unspecified site: Secondary | ICD-10-CM | POA: Diagnosis not present

## 2017-03-23 DIAGNOSIS — S32020D Wedge compression fracture of second lumbar vertebra, subsequent encounter for fracture with routine healing: Secondary | ICD-10-CM | POA: Diagnosis not present

## 2017-03-23 DIAGNOSIS — M5134 Other intervertebral disc degeneration, thoracic region: Secondary | ICD-10-CM | POA: Diagnosis not present

## 2017-04-09 DIAGNOSIS — R6889 Other general symptoms and signs: Secondary | ICD-10-CM | POA: Diagnosis not present

## 2017-04-09 DIAGNOSIS — I10 Essential (primary) hypertension: Secondary | ICD-10-CM | POA: Diagnosis not present

## 2017-04-09 DIAGNOSIS — J209 Acute bronchitis, unspecified: Secondary | ICD-10-CM | POA: Diagnosis not present

## 2017-04-14 DIAGNOSIS — M5135 Other intervertebral disc degeneration, thoracolumbar region: Secondary | ICD-10-CM | POA: Diagnosis not present

## 2017-04-14 DIAGNOSIS — S22089D Unspecified fracture of T11-T12 vertebra, subsequent encounter for fracture with routine healing: Secondary | ICD-10-CM | POA: Diagnosis not present

## 2017-04-14 DIAGNOSIS — S32028D Other fracture of second lumbar vertebra, subsequent encounter for fracture with routine healing: Secondary | ICD-10-CM | POA: Diagnosis not present

## 2017-04-14 DIAGNOSIS — S32018D Other fracture of first lumbar vertebra, subsequent encounter for fracture with routine healing: Secondary | ICD-10-CM | POA: Diagnosis not present

## 2017-04-14 DIAGNOSIS — S22088D Other fracture of T11-T12 vertebra, subsequent encounter for fracture with routine healing: Secondary | ICD-10-CM | POA: Diagnosis not present

## 2017-04-14 DIAGNOSIS — S32029D Unspecified fracture of second lumbar vertebra, subsequent encounter for fracture with routine healing: Secondary | ICD-10-CM | POA: Diagnosis not present

## 2017-04-14 DIAGNOSIS — S32019D Unspecified fracture of first lumbar vertebra, subsequent encounter for fracture with routine healing: Secondary | ICD-10-CM | POA: Diagnosis not present

## 2017-04-30 DIAGNOSIS — I10 Essential (primary) hypertension: Secondary | ICD-10-CM | POA: Diagnosis not present

## 2017-04-30 DIAGNOSIS — Z79899 Other long term (current) drug therapy: Secondary | ICD-10-CM | POA: Diagnosis not present

## 2017-04-30 DIAGNOSIS — Z1159 Encounter for screening for other viral diseases: Secondary | ICD-10-CM | POA: Diagnosis not present

## 2017-05-24 DIAGNOSIS — S32010A Wedge compression fracture of first lumbar vertebra, initial encounter for closed fracture: Secondary | ICD-10-CM | POA: Diagnosis not present

## 2017-05-24 DIAGNOSIS — S22080A Wedge compression fracture of T11-T12 vertebra, initial encounter for closed fracture: Secondary | ICD-10-CM | POA: Diagnosis not present

## 2017-05-24 DIAGNOSIS — S22000D Wedge compression fracture of unspecified thoracic vertebra, subsequent encounter for fracture with routine healing: Secondary | ICD-10-CM | POA: Diagnosis not present

## 2017-05-24 DIAGNOSIS — I700 Atherosclerosis of aorta: Secondary | ICD-10-CM | POA: Diagnosis not present

## 2017-05-24 DIAGNOSIS — M8588 Other specified disorders of bone density and structure, other site: Secondary | ICD-10-CM | POA: Diagnosis not present

## 2017-05-24 DIAGNOSIS — S32010D Wedge compression fracture of first lumbar vertebra, subsequent encounter for fracture with routine healing: Secondary | ICD-10-CM | POA: Diagnosis not present

## 2017-05-24 DIAGNOSIS — S32020A Wedge compression fracture of second lumbar vertebra, initial encounter for closed fracture: Secondary | ICD-10-CM | POA: Diagnosis not present

## 2017-05-24 DIAGNOSIS — M8580 Other specified disorders of bone density and structure, unspecified site: Secondary | ICD-10-CM | POA: Diagnosis not present

## 2017-05-24 DIAGNOSIS — S32020D Wedge compression fracture of second lumbar vertebra, subsequent encounter for fracture with routine healing: Secondary | ICD-10-CM | POA: Diagnosis not present

## 2017-05-24 DIAGNOSIS — M5135 Other intervertebral disc degeneration, thoracolumbar region: Secondary | ICD-10-CM | POA: Diagnosis not present

## 2017-08-09 DIAGNOSIS — F5101 Primary insomnia: Secondary | ICD-10-CM | POA: Diagnosis not present

## 2017-08-09 DIAGNOSIS — E785 Hyperlipidemia, unspecified: Secondary | ICD-10-CM | POA: Diagnosis not present

## 2017-08-09 DIAGNOSIS — I10 Essential (primary) hypertension: Secondary | ICD-10-CM | POA: Diagnosis not present

## 2017-10-27 DIAGNOSIS — S22089D Unspecified fracture of T11-T12 vertebra, subsequent encounter for fracture with routine healing: Secondary | ICD-10-CM | POA: Diagnosis not present

## 2017-10-27 DIAGNOSIS — S22000D Wedge compression fracture of unspecified thoracic vertebra, subsequent encounter for fracture with routine healing: Secondary | ICD-10-CM | POA: Diagnosis not present

## 2017-10-27 DIAGNOSIS — M47815 Spondylosis without myelopathy or radiculopathy, thoracolumbar region: Secondary | ICD-10-CM | POA: Diagnosis not present

## 2017-10-27 DIAGNOSIS — M8588 Other specified disorders of bone density and structure, other site: Secondary | ICD-10-CM | POA: Diagnosis not present

## 2017-10-27 DIAGNOSIS — S22088D Other fracture of T11-T12 vertebra, subsequent encounter for fracture with routine healing: Secondary | ICD-10-CM | POA: Diagnosis not present

## 2017-10-27 DIAGNOSIS — S32029D Unspecified fracture of second lumbar vertebra, subsequent encounter for fracture with routine healing: Secondary | ICD-10-CM | POA: Diagnosis not present

## 2017-10-27 DIAGNOSIS — S32018D Other fracture of first lumbar vertebra, subsequent encounter for fracture with routine healing: Secondary | ICD-10-CM | POA: Diagnosis not present

## 2017-10-27 DIAGNOSIS — S32019D Unspecified fracture of first lumbar vertebra, subsequent encounter for fracture with routine healing: Secondary | ICD-10-CM | POA: Diagnosis not present

## 2017-10-27 DIAGNOSIS — S32028D Other fracture of second lumbar vertebra, subsequent encounter for fracture with routine healing: Secondary | ICD-10-CM | POA: Diagnosis not present

## 2017-10-27 DIAGNOSIS — I7090 Unspecified atherosclerosis: Secondary | ICD-10-CM | POA: Diagnosis not present

## 2018-11-29 DIAGNOSIS — H6691 Otitis media, unspecified, right ear: Secondary | ICD-10-CM | POA: Diagnosis not present

## 2019-04-24 DIAGNOSIS — G4709 Other insomnia: Secondary | ICD-10-CM | POA: Diagnosis not present

## 2019-04-24 DIAGNOSIS — Z760 Encounter for issue of repeat prescription: Secondary | ICD-10-CM | POA: Diagnosis not present

## 2019-04-24 DIAGNOSIS — I10 Essential (primary) hypertension: Secondary | ICD-10-CM | POA: Diagnosis not present

## 2019-11-19 ENCOUNTER — Emergency Department (HOSPITAL_COMMUNITY): Payer: Medicare Other

## 2019-11-19 ENCOUNTER — Inpatient Hospital Stay (HOSPITAL_COMMUNITY)
Admission: EM | Admit: 2019-11-19 | Discharge: 2019-11-22 | DRG: 563 | Disposition: A | Discharge status: Skilled Nursing Facility | Payer: Medicare Other | Attending: Internal Medicine | Admitting: Internal Medicine

## 2019-11-19 ENCOUNTER — Other Ambulatory Visit: Payer: Self-pay

## 2019-11-19 ENCOUNTER — Encounter (HOSPITAL_COMMUNITY): Payer: Self-pay | Admitting: Emergency Medicine

## 2019-11-19 DIAGNOSIS — Y792 Prosthetic and other implants, materials and accessory orthopedic devices associated with adverse incidents: Secondary | ICD-10-CM | POA: Diagnosis present

## 2019-11-19 DIAGNOSIS — S92301G Fracture of unspecified metatarsal bone(s), right foot, subsequent encounter for fracture with delayed healing: Secondary | ICD-10-CM | POA: Diagnosis not present

## 2019-11-19 DIAGNOSIS — S2242XA Multiple fractures of ribs, left side, initial encounter for closed fracture: Secondary | ICD-10-CM | POA: Diagnosis present

## 2019-11-19 DIAGNOSIS — Z7982 Long term (current) use of aspirin: Secondary | ICD-10-CM

## 2019-11-19 DIAGNOSIS — W19XXXA Unspecified fall, initial encounter: Secondary | ICD-10-CM | POA: Diagnosis not present

## 2019-11-19 DIAGNOSIS — S92351A Displaced fracture of fifth metatarsal bone, right foot, initial encounter for closed fracture: Secondary | ICD-10-CM | POA: Diagnosis present

## 2019-11-19 DIAGNOSIS — Z79899 Other long term (current) drug therapy: Secondary | ICD-10-CM

## 2019-11-19 DIAGNOSIS — Z720 Tobacco use: Secondary | ICD-10-CM

## 2019-11-19 DIAGNOSIS — S92352A Displaced fracture of fifth metatarsal bone, left foot, initial encounter for closed fracture: Secondary | ICD-10-CM | POA: Diagnosis not present

## 2019-11-19 DIAGNOSIS — E46 Unspecified protein-calorie malnutrition: Secondary | ICD-10-CM | POA: Diagnosis not present

## 2019-11-19 DIAGNOSIS — F411 Generalized anxiety disorder: Secondary | ICD-10-CM

## 2019-11-19 DIAGNOSIS — S50311A Abrasion of right elbow, initial encounter: Secondary | ICD-10-CM | POA: Diagnosis present

## 2019-11-19 DIAGNOSIS — F1010 Alcohol abuse, uncomplicated: Secondary | ICD-10-CM | POA: Diagnosis not present

## 2019-11-19 DIAGNOSIS — S82851A Displaced trimalleolar fracture of right lower leg, initial encounter for closed fracture: Secondary | ICD-10-CM

## 2019-11-19 DIAGNOSIS — F17210 Nicotine dependence, cigarettes, uncomplicated: Secondary | ICD-10-CM | POA: Diagnosis not present

## 2019-11-19 DIAGNOSIS — Y92019 Unspecified place in single-family (private) house as the place of occurrence of the external cause: Secondary | ICD-10-CM | POA: Diagnosis not present

## 2019-11-19 DIAGNOSIS — Z820 Family history of epilepsy and other diseases of the nervous system: Secondary | ICD-10-CM | POA: Diagnosis not present

## 2019-11-19 DIAGNOSIS — S9305XA Dislocation of left ankle joint, initial encounter: Secondary | ICD-10-CM | POA: Diagnosis present

## 2019-11-19 DIAGNOSIS — Z8249 Family history of ischemic heart disease and other diseases of the circulatory system: Secondary | ICD-10-CM

## 2019-11-19 DIAGNOSIS — Z638 Other specified problems related to primary support group: Secondary | ICD-10-CM | POA: Diagnosis not present

## 2019-11-19 DIAGNOSIS — F32A Depression, unspecified: Secondary | ICD-10-CM | POA: Diagnosis not present

## 2019-11-19 DIAGNOSIS — S82851D Displaced trimalleolar fracture of right lower leg, subsequent encounter for closed fracture with routine healing: Secondary | ICD-10-CM | POA: Diagnosis not present

## 2019-11-19 DIAGNOSIS — I10 Essential (primary) hypertension: Secondary | ICD-10-CM | POA: Diagnosis present

## 2019-11-19 DIAGNOSIS — S82841A Displaced bimalleolar fracture of right lower leg, initial encounter for closed fracture: Secondary | ICD-10-CM | POA: Diagnosis not present

## 2019-11-19 DIAGNOSIS — W010XXA Fall on same level from slipping, tripping and stumbling without subsequent striking against object, initial encounter: Secondary | ICD-10-CM | POA: Diagnosis present

## 2019-11-19 DIAGNOSIS — J449 Chronic obstructive pulmonary disease, unspecified: Secondary | ICD-10-CM | POA: Diagnosis present

## 2019-11-19 DIAGNOSIS — E785 Hyperlipidemia, unspecified: Secondary | ICD-10-CM | POA: Diagnosis not present

## 2019-11-19 DIAGNOSIS — E871 Hypo-osmolality and hyponatremia: Secondary | ICD-10-CM | POA: Diagnosis present

## 2019-11-19 DIAGNOSIS — F1020 Alcohol dependence, uncomplicated: Secondary | ICD-10-CM | POA: Diagnosis present

## 2019-11-19 DIAGNOSIS — S2232XA Fracture of one rib, left side, initial encounter for closed fracture: Secondary | ICD-10-CM

## 2019-11-19 DIAGNOSIS — S82891A Other fracture of right lower leg, initial encounter for closed fracture: Secondary | ICD-10-CM | POA: Diagnosis not present

## 2019-11-19 DIAGNOSIS — S8291XD Unspecified fracture of right lower leg, subsequent encounter for closed fracture with routine healing: Secondary | ICD-10-CM | POA: Diagnosis not present

## 2019-11-19 DIAGNOSIS — R262 Difficulty in walking, not elsewhere classified: Secondary | ICD-10-CM | POA: Diagnosis not present

## 2019-11-19 DIAGNOSIS — S8251XA Displaced fracture of medial malleolus of right tibia, initial encounter for closed fracture: Secondary | ICD-10-CM | POA: Diagnosis not present

## 2019-11-19 DIAGNOSIS — Z743 Need for continuous supervision: Secondary | ICD-10-CM | POA: Diagnosis not present

## 2019-11-19 DIAGNOSIS — S2232XD Fracture of one rib, left side, subsequent encounter for fracture with routine healing: Secondary | ICD-10-CM | POA: Diagnosis not present

## 2019-11-19 DIAGNOSIS — Z043 Encounter for examination and observation following other accident: Secondary | ICD-10-CM | POA: Diagnosis not present

## 2019-11-19 DIAGNOSIS — Z20822 Contact with and (suspected) exposure to covid-19: Secondary | ICD-10-CM | POA: Diagnosis present

## 2019-11-19 DIAGNOSIS — M6281 Muscle weakness (generalized): Secondary | ICD-10-CM | POA: Diagnosis not present

## 2019-11-19 DIAGNOSIS — F419 Anxiety disorder, unspecified: Secondary | ICD-10-CM | POA: Diagnosis present

## 2019-11-19 DIAGNOSIS — S82891D Other fracture of right lower leg, subsequent encounter for closed fracture with routine healing: Secondary | ICD-10-CM | POA: Diagnosis not present

## 2019-11-19 DIAGNOSIS — E782 Mixed hyperlipidemia: Secondary | ICD-10-CM | POA: Diagnosis not present

## 2019-11-19 DIAGNOSIS — Z8781 Personal history of (healed) traumatic fracture: Secondary | ICD-10-CM

## 2019-11-19 DIAGNOSIS — Z8546 Personal history of malignant neoplasm of prostate: Secondary | ICD-10-CM

## 2019-11-19 DIAGNOSIS — E869 Volume depletion, unspecified: Secondary | ICD-10-CM | POA: Diagnosis not present

## 2019-11-19 DIAGNOSIS — Z9889 Other specified postprocedural states: Secondary | ICD-10-CM

## 2019-11-19 DIAGNOSIS — R404 Transient alteration of awareness: Secondary | ICD-10-CM | POA: Diagnosis not present

## 2019-11-19 DIAGNOSIS — T84126A Displacement of internal fixation device of bone of right lower leg, initial encounter: Secondary | ICD-10-CM | POA: Diagnosis present

## 2019-11-19 LAB — CBC WITH DIFFERENTIAL/PLATELET
Abs Immature Granulocytes: 0.04 10*3/uL (ref 0.00–0.07)
Basophils Absolute: 0.1 10*3/uL (ref 0.0–0.1)
Basophils Relative: 1 %
Eosinophils Absolute: 0.3 10*3/uL (ref 0.0–0.5)
Eosinophils Relative: 4 %
HCT: 39.2 % (ref 39.0–52.0)
Hemoglobin: 13.8 g/dL (ref 13.0–17.0)
Immature Granulocytes: 1 %
Lymphocytes Relative: 16 %
Lymphs Abs: 1.3 10*3/uL (ref 0.7–4.0)
MCH: 34.5 pg — ABNORMAL HIGH (ref 26.0–34.0)
MCHC: 35.2 g/dL (ref 30.0–36.0)
MCV: 98 fL (ref 80.0–100.0)
Monocytes Absolute: 0.4 10*3/uL (ref 0.1–1.0)
Monocytes Relative: 5 %
Neutro Abs: 6 10*3/uL (ref 1.7–7.7)
Neutrophils Relative %: 73 %
Platelets: 213 10*3/uL (ref 150–400)
RBC: 4 MIL/uL — ABNORMAL LOW (ref 4.22–5.81)
RDW: 12.6 % (ref 11.5–15.5)
WBC: 8.1 10*3/uL (ref 4.0–10.5)
nRBC: 0 % (ref 0.0–0.2)

## 2019-11-19 LAB — BASIC METABOLIC PANEL
Anion gap: 10 (ref 5–15)
BUN: 6 mg/dL — ABNORMAL LOW (ref 8–23)
CO2: 23 mmol/L (ref 22–32)
Calcium: 8.4 mg/dL — ABNORMAL LOW (ref 8.9–10.3)
Chloride: 93 mmol/L — ABNORMAL LOW (ref 98–111)
Creatinine, Ser: 0.56 mg/dL — ABNORMAL LOW (ref 0.61–1.24)
GFR, Estimated: >60 mL/min (ref >60)
Glucose, Bld: 96 mg/dL (ref 70–99)
Potassium: 4.3 mmol/L (ref 3.5–5.1)
Sodium: 126 mmol/L — ABNORMAL LOW (ref 135–145)

## 2019-11-19 LAB — RESPIRATORY PANEL BY RT PCR (FLU A&B, COVID)
Influenza A by PCR: NEGATIVE
Influenza B by PCR: NEGATIVE
SARS Coronavirus 2 by RT PCR: NEGATIVE

## 2019-11-19 NOTE — ED Triage Notes (Signed)
Pt c/o right ankle injury with obvious deformity after a fall x 2 nights ago.

## 2019-11-19 NOTE — ED Notes (Signed)
X-Ray at bedside.

## 2019-11-20 ENCOUNTER — Encounter (HOSPITAL_COMMUNITY): Payer: Self-pay | Admitting: Internal Medicine

## 2019-11-20 ENCOUNTER — Inpatient Hospital Stay (HOSPITAL_COMMUNITY): Payer: Medicare Other | Admitting: Anesthesiology

## 2019-11-20 ENCOUNTER — Inpatient Hospital Stay (HOSPITAL_COMMUNITY): Payer: Medicare Other

## 2019-11-20 ENCOUNTER — Encounter (HOSPITAL_COMMUNITY)
Admission: EM | Disposition: A | Discharge status: Skilled Nursing Facility | Payer: Self-pay | Source: Home / Self Care | Attending: Internal Medicine

## 2019-11-20 DIAGNOSIS — S9305XA Dislocation of left ankle joint, initial encounter: Secondary | ICD-10-CM | POA: Diagnosis not present

## 2019-11-20 DIAGNOSIS — I10 Essential (primary) hypertension: Secondary | ICD-10-CM

## 2019-11-20 DIAGNOSIS — F1029 Alcohol dependence with unspecified alcohol-induced disorder: Secondary | ICD-10-CM

## 2019-11-20 DIAGNOSIS — S82891A Other fracture of right lower leg, initial encounter for closed fracture: Secondary | ICD-10-CM | POA: Diagnosis not present

## 2019-11-20 DIAGNOSIS — S82891D Other fracture of right lower leg, subsequent encounter for closed fracture with routine healing: Secondary | ICD-10-CM | POA: Diagnosis not present

## 2019-11-20 DIAGNOSIS — S2232XA Fracture of one rib, left side, initial encounter for closed fracture: Secondary | ICD-10-CM

## 2019-11-20 DIAGNOSIS — Z638 Other specified problems related to primary support group: Secondary | ICD-10-CM | POA: Diagnosis not present

## 2019-11-20 DIAGNOSIS — E46 Unspecified protein-calorie malnutrition: Secondary | ICD-10-CM | POA: Diagnosis not present

## 2019-11-20 DIAGNOSIS — W19XXXA Unspecified fall, initial encounter: Secondary | ICD-10-CM | POA: Diagnosis not present

## 2019-11-20 DIAGNOSIS — S2232XD Fracture of one rib, left side, subsequent encounter for fracture with routine healing: Secondary | ICD-10-CM | POA: Diagnosis not present

## 2019-11-20 DIAGNOSIS — S82841A Displaced bimalleolar fracture of right lower leg, initial encounter for closed fracture: Secondary | ICD-10-CM | POA: Diagnosis not present

## 2019-11-20 DIAGNOSIS — S92301G Fracture of unspecified metatarsal bone(s), right foot, subsequent encounter for fracture with delayed healing: Secondary | ICD-10-CM

## 2019-11-20 DIAGNOSIS — F1010 Alcohol abuse, uncomplicated: Secondary | ICD-10-CM

## 2019-11-20 DIAGNOSIS — Z720 Tobacco use: Secondary | ICD-10-CM

## 2019-11-20 DIAGNOSIS — Z20822 Contact with and (suspected) exposure to covid-19: Secondary | ICD-10-CM | POA: Diagnosis present

## 2019-11-20 DIAGNOSIS — Y792 Prosthetic and other implants, materials and accessory orthopedic devices associated with adverse incidents: Secondary | ICD-10-CM | POA: Diagnosis present

## 2019-11-20 DIAGNOSIS — Y92019 Unspecified place in single-family (private) house as the place of occurrence of the external cause: Secondary | ICD-10-CM | POA: Diagnosis not present

## 2019-11-20 DIAGNOSIS — F411 Generalized anxiety disorder: Secondary | ICD-10-CM | POA: Diagnosis not present

## 2019-11-20 DIAGNOSIS — S8291XD Unspecified fracture of right lower leg, subsequent encounter for closed fracture with routine healing: Secondary | ICD-10-CM | POA: Diagnosis not present

## 2019-11-20 DIAGNOSIS — W010XXA Fall on same level from slipping, tripping and stumbling without subsequent striking against object, initial encounter: Secondary | ICD-10-CM | POA: Diagnosis present

## 2019-11-20 DIAGNOSIS — T84126A Displacement of internal fixation device of bone of right lower leg, initial encounter: Secondary | ICD-10-CM | POA: Diagnosis not present

## 2019-11-20 DIAGNOSIS — Z8546 Personal history of malignant neoplasm of prostate: Secondary | ICD-10-CM | POA: Diagnosis not present

## 2019-11-20 DIAGNOSIS — Z820 Family history of epilepsy and other diseases of the nervous system: Secondary | ICD-10-CM | POA: Diagnosis not present

## 2019-11-20 DIAGNOSIS — E785 Hyperlipidemia, unspecified: Secondary | ICD-10-CM | POA: Diagnosis not present

## 2019-11-20 DIAGNOSIS — S2242XA Multiple fractures of ribs, left side, initial encounter for closed fracture: Secondary | ICD-10-CM | POA: Diagnosis present

## 2019-11-20 DIAGNOSIS — Z8781 Personal history of (healed) traumatic fracture: Secondary | ICD-10-CM | POA: Diagnosis not present

## 2019-11-20 DIAGNOSIS — Z79899 Other long term (current) drug therapy: Secondary | ICD-10-CM | POA: Diagnosis not present

## 2019-11-20 DIAGNOSIS — F17210 Nicotine dependence, cigarettes, uncomplicated: Secondary | ICD-10-CM | POA: Diagnosis present

## 2019-11-20 DIAGNOSIS — Z043 Encounter for examination and observation following other accident: Secondary | ICD-10-CM | POA: Diagnosis not present

## 2019-11-20 DIAGNOSIS — E782 Mixed hyperlipidemia: Secondary | ICD-10-CM | POA: Diagnosis not present

## 2019-11-20 DIAGNOSIS — Z8249 Family history of ischemic heart disease and other diseases of the circulatory system: Secondary | ICD-10-CM | POA: Diagnosis not present

## 2019-11-20 DIAGNOSIS — S92351A Displaced fracture of fifth metatarsal bone, right foot, initial encounter for closed fracture: Secondary | ICD-10-CM | POA: Diagnosis not present

## 2019-11-20 DIAGNOSIS — R262 Difficulty in walking, not elsewhere classified: Secondary | ICD-10-CM | POA: Diagnosis not present

## 2019-11-20 DIAGNOSIS — S82851A Displaced trimalleolar fracture of right lower leg, initial encounter for closed fracture: Secondary | ICD-10-CM | POA: Diagnosis not present

## 2019-11-20 DIAGNOSIS — F32A Depression, unspecified: Secondary | ICD-10-CM | POA: Diagnosis present

## 2019-11-20 DIAGNOSIS — F1020 Alcohol dependence, uncomplicated: Secondary | ICD-10-CM | POA: Diagnosis present

## 2019-11-20 DIAGNOSIS — F419 Anxiety disorder, unspecified: Secondary | ICD-10-CM | POA: Diagnosis present

## 2019-11-20 DIAGNOSIS — J449 Chronic obstructive pulmonary disease, unspecified: Secondary | ICD-10-CM | POA: Diagnosis present

## 2019-11-20 DIAGNOSIS — M6281 Muscle weakness (generalized): Secondary | ICD-10-CM | POA: Diagnosis not present

## 2019-11-20 DIAGNOSIS — S82851D Displaced trimalleolar fracture of right lower leg, subsequent encounter for closed fracture with routine healing: Secondary | ICD-10-CM | POA: Diagnosis not present

## 2019-11-20 DIAGNOSIS — Z7982 Long term (current) use of aspirin: Secondary | ICD-10-CM | POA: Diagnosis not present

## 2019-11-20 DIAGNOSIS — S50311A Abrasion of right elbow, initial encounter: Secondary | ICD-10-CM | POA: Diagnosis present

## 2019-11-20 DIAGNOSIS — E871 Hypo-osmolality and hyponatremia: Secondary | ICD-10-CM

## 2019-11-20 DIAGNOSIS — E869 Volume depletion, unspecified: Secondary | ICD-10-CM | POA: Diagnosis present

## 2019-11-20 HISTORY — PX: ANKLE CLOSED REDUCTION: SHX880

## 2019-11-20 LAB — PROTIME-INR
INR: 1.1 (ref 0.8–1.2)
Prothrombin Time: 13.5 seconds (ref 11.4–15.2)

## 2019-11-20 LAB — CBC
HCT: 39 % (ref 39.0–52.0)
Hemoglobin: 13.6 g/dL (ref 13.0–17.0)
MCH: 33.7 pg (ref 26.0–34.0)
MCHC: 34.9 g/dL (ref 30.0–36.0)
MCV: 96.8 fL (ref 80.0–100.0)
Platelets: 202 10*3/uL (ref 150–400)
RBC: 4.03 MIL/uL — ABNORMAL LOW (ref 4.22–5.81)
RDW: 12.4 % (ref 11.5–15.5)
WBC: 8.9 10*3/uL (ref 4.0–10.5)
nRBC: 0 % (ref 0.0–0.2)

## 2019-11-20 LAB — OSMOLALITY: Osmolality: 309 mOsm/kg — ABNORMAL HIGH (ref 275–295)

## 2019-11-20 LAB — COMPREHENSIVE METABOLIC PANEL
ALT: 24 U/L (ref 0–44)
AST: 37 U/L (ref 15–41)
Albumin: 3.6 g/dL (ref 3.5–5.0)
Alkaline Phosphatase: 72 U/L (ref 38–126)
Anion gap: 11 (ref 5–15)
BUN: 5 mg/dL — ABNORMAL LOW (ref 8–23)
CO2: 22 mmol/L (ref 22–32)
Calcium: 8.7 mg/dL — ABNORMAL LOW (ref 8.9–10.3)
Chloride: 101 mmol/L (ref 98–111)
Creatinine, Ser: 0.46 mg/dL — ABNORMAL LOW (ref 0.61–1.24)
GFR, Estimated: >60 mL/min (ref >60)
Glucose, Bld: 74 mg/dL (ref 70–99)
Potassium: 3.9 mmol/L (ref 3.5–5.1)
Sodium: 134 mmol/L — ABNORMAL LOW (ref 135–145)
Total Bilirubin: 1 mg/dL (ref 0.3–1.2)
Total Protein: 6.2 g/dL — ABNORMAL LOW (ref 6.5–8.1)

## 2019-11-20 LAB — LIPID PANEL
Cholesterol: 171 mg/dL (ref 0–200)
HDL: 72 mg/dL (ref >40)
LDL Cholesterol: 89 mg/dL (ref 0–99)
Total CHOL/HDL Ratio: 2.4 RATIO
Triglycerides: 49 mg/dL (ref <150)
VLDL: 10 mg/dL (ref 0–40)

## 2019-11-20 LAB — PHOSPHORUS: Phosphorus: 3.4 mg/dL (ref 2.5–4.6)

## 2019-11-20 LAB — VITAMIN B12: Vitamin B-12: 320 pg/mL (ref 180–914)

## 2019-11-20 LAB — APTT: aPTT: 34 seconds (ref 24–36)

## 2019-11-20 LAB — MAGNESIUM: Magnesium: 1.7 mg/dL (ref 1.7–2.4)

## 2019-11-20 LAB — FOLATE: Folate: 10 ng/mL (ref >5.9)

## 2019-11-20 SURGERY — CLOSED REDUCTION, ANKLE
Anesthesia: General | Site: Ankle | Laterality: Right

## 2019-11-20 MED ORDER — ORAL CARE MOUTH RINSE: 15.0000 mL | Freq: Once | OROMUCOSAL | Status: AC

## 2019-11-20 MED ORDER — LACTATED RINGERS IV SOLN: INTRAVENOUS | Status: DC | PRN

## 2019-11-20 MED ORDER — LIDOCAINE 2% (20 MG/ML) 5 ML SYRINGE
INTRAMUSCULAR | Status: AC
  Filled 2019-11-20: qty 5

## 2019-11-20 MED ORDER — LIDOCAINE 2% (20 MG/ML) 5 ML SYRINGE
INTRAMUSCULAR | Status: AC
  Filled 2019-11-20: qty 10

## 2019-11-20 MED ORDER — HYDROMORPHONE HCL 1 MG/ML IJ SOLN
0.2500 mg | INTRAMUSCULAR | Status: DC | PRN
  Administered 2019-11-20 (×2): 0.5 mg via INTRAVENOUS
  Filled 2019-11-20 (×2): qty 0.5

## 2019-11-20 MED ORDER — LORAZEPAM 1 MG PO TABS
1.0000 mg | ORAL_TABLET | ORAL | Status: DC | PRN
  Administered 2019-11-22 (×2): 2 mg via ORAL
  Filled 2019-11-20 (×2): qty 2

## 2019-11-20 MED ORDER — FENTANYL CITRATE (PF) 100 MCG/2ML IJ SOLN
INTRAMUSCULAR | Status: AC
  Filled 2019-11-20: qty 2

## 2019-11-20 MED ORDER — LIDOCAINE 2% (20 MG/ML) 5 ML SYRINGE
INTRAMUSCULAR | Status: DC | PRN
  Administered 2019-11-20: 60 mg via INTRAVENOUS

## 2019-11-20 MED ORDER — FOLIC ACID 1 MG PO TABS
1.0000 mg | ORAL_TABLET | Freq: Every day | ORAL | Status: DC
  Administered 2019-11-21 – 2019-11-22 (×2): 1 mg via ORAL
  Filled 2019-11-20 (×2): qty 1

## 2019-11-20 MED ORDER — PROPOFOL 10 MG/ML IV BOLUS
INTRAVENOUS | Status: DC | PRN
  Administered 2019-11-20: 150 mg via INTRAVENOUS

## 2019-11-20 MED ORDER — PHENYLEPHRINE 40 MCG/ML (10ML) SYRINGE FOR IV PUSH (FOR BLOOD PRESSURE SUPPORT)
PREFILLED_SYRINGE | INTRAVENOUS | Status: DC | PRN
  Administered 2019-11-20: 80 ug via INTRAVENOUS
  Administered 2019-11-20 (×2): 120 ug via INTRAVENOUS
  Administered 2019-11-20: 80 ug via INTRAVENOUS

## 2019-11-20 MED ORDER — EPHEDRINE 5 MG/ML INJ
INTRAVENOUS | Status: AC
  Filled 2019-11-20: qty 10

## 2019-11-20 MED ORDER — ROCURONIUM BROMIDE 10 MG/ML (PF) SYRINGE
PREFILLED_SYRINGE | INTRAVENOUS | Status: AC
  Filled 2019-11-20: qty 10

## 2019-11-20 MED ORDER — LORAZEPAM 2 MG/ML IJ SOLN
INTRAMUSCULAR | Status: AC
  Filled 2019-11-20: qty 1

## 2019-11-20 MED ORDER — MIDAZOLAM HCL 2 MG/2ML IJ SOLN
INTRAMUSCULAR | Status: AC
  Filled 2019-11-20: qty 2

## 2019-11-20 MED ORDER — CHLORHEXIDINE GLUCONATE 0.12 % MT SOLN
15.0000 mL | Freq: Once | OROMUCOSAL | Status: AC
  Administered 2019-11-20: 15 mL via OROMUCOSAL

## 2019-11-20 MED ORDER — MORPHINE SULFATE (PF) 2 MG/ML IV SOLN
2.0000 mg | INTRAVENOUS | Status: DC | PRN
  Administered 2019-11-21: 2 mg via INTRAVENOUS
  Filled 2019-11-20: qty 1

## 2019-11-20 MED ORDER — CHLORHEXIDINE GLUCONATE 0.12 % MT SOLN
OROMUCOSAL | Status: AC
  Filled 2019-11-20: qty 15

## 2019-11-20 MED ORDER — LORAZEPAM 2 MG/ML IJ SOLN
1.0000 mg | INTRAMUSCULAR | Status: DC | PRN
  Administered 2019-11-20: 1 mg via INTRAVENOUS
  Administered 2019-11-22: 2 mg via INTRAVENOUS
  Filled 2019-11-20: qty 1

## 2019-11-20 MED ORDER — FENTANYL CITRATE (PF) 100 MCG/2ML IJ SOLN
INTRAMUSCULAR | Status: DC | PRN
Start: 2019-11-20 — End: 2019-11-20
  Administered 2019-11-20 (×2): 50 ug via INTRAVENOUS

## 2019-11-20 MED ORDER — MEPERIDINE HCL 50 MG/ML IJ SOLN: 6.2500 mg | INTRAMUSCULAR | Status: DC | PRN

## 2019-11-20 MED ORDER — SODIUM CHLORIDE 0.9 % IV SOLN: Freq: Once | INTRAVENOUS | Status: AC

## 2019-11-20 MED ORDER — NICOTINE 21 MG/24HR TD PT24
21.0000 mg | MEDICATED_PATCH | Freq: Every day | TRANSDERMAL | Status: DC
  Administered 2019-11-20 – 2019-11-22 (×3): 21 mg via TRANSDERMAL
  Filled 2019-11-20 (×3): qty 1

## 2019-11-20 MED ORDER — THIAMINE HCL 100 MG PO TABS
100.0000 mg | ORAL_TABLET | Freq: Every day | ORAL | Status: DC
  Administered 2019-11-21 – 2019-11-22 (×2): 100 mg via ORAL
  Filled 2019-11-20 (×2): qty 1

## 2019-11-20 MED ORDER — ADULT MULTIVITAMIN W/MINERALS CH
1.0000 | ORAL_TABLET | Freq: Every day | ORAL | Status: DC
  Administered 2019-11-21 – 2019-11-22 (×2): 1 via ORAL
  Filled 2019-11-20 (×2): qty 1

## 2019-11-20 MED ORDER — PHENYLEPHRINE 40 MCG/ML (10ML) SYRINGE FOR IV PUSH (FOR BLOOD PRESSURE SUPPORT)
PREFILLED_SYRINGE | INTRAVENOUS | Status: AC
  Filled 2019-11-20: qty 10

## 2019-11-20 MED ORDER — ONDANSETRON HCL 4 MG/2ML IJ SOLN: 4.0000 mg | Freq: Once | INTRAMUSCULAR | Status: DC | PRN

## 2019-11-20 MED ORDER — FENTANYL CITRATE (PF) 100 MCG/2ML IJ SOLN
INTRAMUSCULAR | Status: AC
Start: 2019-11-20 — End: ?
  Filled 2019-11-20: qty 2

## 2019-11-20 MED ORDER — DEXMEDETOMIDINE HCL IN NACL 200 MCG/50ML IV SOLN
INTRAVENOUS | Status: AC
  Filled 2019-11-20: qty 50

## 2019-11-20 MED ORDER — THIAMINE HCL 100 MG/ML IJ SOLN: 100.0000 mg | Freq: Every day | INTRAMUSCULAR | Status: DC

## 2019-11-20 MED ORDER — FENTANYL CITRATE (PF) 100 MCG/2ML IJ SOLN
50.0000 ug | Freq: Once | INTRAMUSCULAR | Status: AC
  Administered 2019-11-20: 50 ug via INTRAVENOUS

## 2019-11-20 MED ORDER — MIDAZOLAM HCL 2 MG/2ML IJ SOLN: 2.0000 mg | Freq: Once | INTRAMUSCULAR | Status: DC

## 2019-11-20 MED ORDER — LACTATED RINGERS IV SOLN: Freq: Once | INTRAVENOUS | Status: AC

## 2019-11-20 SURGICAL SUPPLY — 66 items
BANDAGE ESMARK 4X12 BL STRL LF (DISPOSABLE) ×2 IMPLANT
BIT DRILL 3.5X122MM AO FIT (BIT) IMPLANT
BIT DRILL CANN 2.7 (BIT)
BIT DRILL CANN 2.7MM (BIT)
BIT DRILL SRG 2.7XCANN AO CPLG (BIT) IMPLANT
BIT DRL SRG 2.7XCANN AO CPLNG (BIT)
BLADE SURG SZ10 CARB STEEL (BLADE) IMPLANT
BNDG COHESIVE 4X5 TAN STRL (GAUZE/BANDAGES/DRESSINGS) ×4 IMPLANT
BNDG ELASTIC 4X5.8 VLCR NS LF (GAUZE/BANDAGES/DRESSINGS) ×4 IMPLANT
BNDG ELASTIC 4X5.8 VLCR STR LF (GAUZE/BANDAGES/DRESSINGS) ×4 IMPLANT
BNDG ELASTIC 6X15 VLCR STRL LF (GAUZE/BANDAGES/DRESSINGS) ×4 IMPLANT
BNDG ESMARK 4X12 BLUE STRL LF (DISPOSABLE) ×4
BNDG PLASTER X FAST 4X5 WHT LF (CAST SUPPLIES) ×16 IMPLANT
CHLORAPREP W/TINT 26 (MISCELLANEOUS) IMPLANT
CLOTH BEACON ORANGE TIMEOUT ST (SAFETY) IMPLANT
COVER LIGHT HANDLE STERIS (MISCELLANEOUS) IMPLANT
COVER WAND RF STERILE (DRAPES) ×4 IMPLANT
CUFF TOURN SGL QUICK 34 (TOURNIQUET CUFF)
CUFF TRNQT CYL 34X4.125X (TOURNIQUET CUFF) IMPLANT
DECANTER SPIKE VIAL GLASS SM (MISCELLANEOUS) IMPLANT
DRAPE C-ARM FOLDED MOBILE STRL (DRAPES) ×4 IMPLANT
DRESSING XEROFORM 5X9 (GAUZE/BANDAGES/DRESSINGS) ×4 IMPLANT
DRILL 2.6X122MM WL AO SHAFT (BIT) IMPLANT
ELECT REM PT RETURN 9FT ADLT (ELECTROSURGICAL)
ELECTRODE REM PT RTRN 9FT ADLT (ELECTROSURGICAL) IMPLANT
GAUZE SPONGE 4X4 12PLY STRL (GAUZE/BANDAGES/DRESSINGS) ×4 IMPLANT
GAUZE XEROFORM 5X9 LF (GAUZE/BANDAGES/DRESSINGS) ×4 IMPLANT
GLOVE BIOGEL PI IND STRL 7.0 (GLOVE) ×6 IMPLANT
GLOVE BIOGEL PI IND STRL 8 (GLOVE) IMPLANT
GLOVE BIOGEL PI INDICATOR 7.0 (GLOVE) ×6
GLOVE BIOGEL PI INDICATOR 8 (GLOVE)
GLOVE SKINSENSE NS SZ8.0 LF (GLOVE)
GLOVE SKINSENSE STRL SZ8.0 LF (GLOVE) IMPLANT
GOWN STRL REUS W/ TWL XL LVL3 (GOWN DISPOSABLE) IMPLANT
GOWN STRL REUS W/TWL LRG LVL3 (GOWN DISPOSABLE) IMPLANT
GOWN STRL REUS W/TWL XL LVL3 (GOWN DISPOSABLE)
INST SET MINOR BONE (KITS) ×4 IMPLANT
K-WIRE 1.6X150 (WIRE)
K-WIRE FX150X1.6XKRSH (WIRE)
K-WIRE ORTHOPEDIC 1.4X150L (WIRE)
K-WIRE SMOOTH 2.0X150 (WIRE)
KIT TURNOVER KIT A (KITS) ×4 IMPLANT
KWIRE FX150X1.6XKRSH (WIRE) IMPLANT
KWIRE ORTHOPEDIC 1.4X150L (WIRE) IMPLANT
KWIRE SMOOTH 2.0X150 (WIRE) IMPLANT
MANIFOLD NEPTUNE II (INSTRUMENTS) ×4 IMPLANT
NEEDLE HYPO 21X1.5 SAFETY (NEEDLE) IMPLANT
NS IRRIG 1000ML POUR BTL (IV SOLUTION) ×4 IMPLANT
PACK BASIC LIMB (CUSTOM PROCEDURE TRAY) ×4 IMPLANT
PAD ABD 5X9 TENDERSORB (GAUZE/BANDAGES/DRESSINGS) IMPLANT
PAD ARMBOARD 7.5X6 YLW CONV (MISCELLANEOUS) IMPLANT
PAD CAST 4YDX4 CTTN HI CHSV (CAST SUPPLIES) ×4 IMPLANT
PADDING CAST COTTON 4X4 STRL (CAST SUPPLIES) ×6
SET BASIN LINEN APH (SET/KITS/TRAYS/PACK) ×4 IMPLANT
SPLINT IMMOBILIZER J 3INX20FT (CAST SUPPLIES)
SPLINT J IMMOBILIZER 3X20FT (CAST SUPPLIES) IMPLANT
SPLINT J IMMOBILIZER 4X20FT (CAST SUPPLIES) IMPLANT
SPLINT J PLASTER J 4INX20Y (CAST SUPPLIES)
SPONGE GAUZE 4X4 12PLY (GAUZE/BANDAGES/DRESSINGS) ×4 IMPLANT
SPONGE LAP 18X18 RF (DISPOSABLE) IMPLANT
STAPLER VISISTAT 35W (STAPLE) IMPLANT
SUT ETHILON 3 0 FSL (SUTURE) IMPLANT
SUT MON AB 0 CT1 (SUTURE) IMPLANT
SUT MON AB 2-0 CT1 36 (SUTURE) IMPLANT
SYR 30ML LL (SYRINGE) IMPLANT
SYR BULB IRRIG 60ML STRL (SYRINGE) ×4 IMPLANT

## 2019-11-20 NOTE — Anesthesia Postprocedure Evaluation (Signed)
Anesthesia Post Note  Patient: Lee Wang  Procedure(s) Performed: CLOSED REDUCTION ANKLE (Right Ankle)  Patient location during evaluation: PACU Anesthesia Type: Spinal Level of consciousness: awake and alert and patient cooperative Pain management: satisfactory to patient (post pain medication) Vital Signs Assessment: post-procedure vital signs reviewed and stable Respiratory status: spontaneous breathing Cardiovascular status: stable Postop Assessment: no apparent nausea or vomiting Anesthetic complications: no   No complications documented.   Last Vitals:  Vitals:   11/20/19 1546 11/20/19 1600  BP:  129/68  Pulse: 80 80  Resp: (!) 9 10  Temp:    SpO2: 95% 95%    Last Pain:  Vitals:   11/20/19 1608  TempSrc:   PainSc: 8                  Neo Yepiz

## 2019-11-20 NOTE — TOC Initial Note (Addendum)
Transition of Care [facility]) - Initial/Assessment Note    Patient Details  Name: Lee Wang MRN: [id number] Date of Birth: 10-15-1945  Transition of Care [facility]) CM/SW Contact:    Iona Beard, [place] Phone Number: 11/20/2019, 10:15 AM  Clinical Narrative:                 Pt admitted for avulsion fracture of metatarsal bone of right foot with delayed healing. TOC completed pt assessment with pts daughter Edythe Lynn [phone]. Pt lives alone and is able to complete is ADL's without assistance. Pt has had no HH services and uses no equipment at home. Pt does not drive, pts daughter or son will take pt to appointments or to pick up medications. Pts daughter states that pt does drink alcohol but she is unsure of the amount. Pts daughter states she believes pt will be agreeable to SNF if it is recommended by PT. CSW informed pts daughter that we would follow upon getting PT eval. TOC to follow.   Addendum (11:02am): TOC completed PASRR screen. Pts PASRR# is [id number]  Expected Discharge Plan: [place] Barriers to Discharge: Continued Medical Work up   Patient Goals and CMS Choice Patient states their goals for this hospitalization and ongoing recovery are:: Return home   Choice offered to / list presented to : NA  Expected Discharge Plan and Services Expected Discharge Plan: [place] In-house Referral: Clinical Social Work Discharge Planning Services: NA Post Acute Care Choice: [place] Living arrangements for the past 2 months: Single Family Home                 DME Arranged: N/A DME Agency: NA       HH Arranged: NA [place] Agency: NA        Prior Living Arrangements/Services Living arrangements for the past 2 months: [place] Lives with:: Self Patient language and need for interpreter reviewed:: Yes Do you feel safe going back to the place where you live?: Yes        Care giver support system in place?: Yes  (comment) Edythe Lynn (daughter) [phone])   Criminal Activity/Legal Involvement Pertinent to Current Situation/Hospitalization: No - Comment as needed  Activities of Daily Living      Permission Sought/Granted                  Emotional Assessment       Orientation: : Oriented to Self, Oriented to Place, Oriented to  Time, Oriented to Situation Alcohol / Substance Use: Alcohol Use Psych Involvement: No (comment)  Admission diagnosis:  Avulsion fracture of metatarsal bone of right foot with delayed healing [S92.301G] Patient Active Problem List   Diagnosis Date Noted  . Avulsion fracture of metatarsal bone of right foot with delayed healing 11/20/2019  . Left rib fracture 11/20/2019  . Hyponatremia 11/20/2019  . Alcohol dependence ([place]) 11/20/2019  . Closed right ankle fracture 11/20/2019  . Tobacco abuse 11/20/2019  . Closed trimalleolar fracture of right ankle   . Greater tuberosity of humerus fracture 10/14/2016  . Closed fracture dislocation of joint of left shoulder girdle 10/08/2016  . GAD (generalized anxiety disorder) 03/09/2015  . Elevated LFTs 11/05/2014  . Abnormal [place] (ultrasound) of abdomen 11/05/2014  . Hepatomegaly 11/05/2014  . History of colonic polyps 11/05/2014  . HTN (hypertension) 05/28/2010  . Hyperlipemia 05/28/2010  . Depression with anxiety 05/28/2010  . Prostate CA ([place]) 05/28/2010  . BPH (benign prostatic hyperplasia) 05/28/2010  .  Alcohol abuse 07/18/2009  . HEMATOCHEZIA 07/18/2009  . DIARRHEA 07/18/2009  . ABDOMINAL PAIN, UNSPECIFIED SITE 07/18/2009   PCP:  Dione Housekeeper, MD Pharmacy:   [facility] [address] [place] [address] [place] [address] Phone: [phone] Fax: [phone]     Social Determinants of Health (SDOH) Interventions    Readmission Risk Interventions No flowsheet data found.

## 2019-11-20 NOTE — Transfer of Care (Signed)
Immediate Anesthesia Transfer of Care Note  Patient: Lee Wang  Procedure(s) Performed: CLOSED REDUCTION ANKLE (Right Ankle)  Patient Location: PACU  Anesthesia Type:General  Level of Consciousness: awake and patient cooperative  Airway & Oxygen Therapy: Patient Spontanous Breathing  Post-op Assessment: Report given to RN and Post -op Vital signs reviewed and stable  Post vital signs: Reviewed and stable  Last Vitals:  Vitals Value Taken Time  BP 125/70 11/20/19 1515  Temp 98.3   Pulse 79 11/20/19 1516  Resp 9 11/20/19 1516  SpO2 95 % 11/20/19 1516  Vitals shown include unvalidated device data.  Last Pain:  Vitals:   11/20/19 1258  TempSrc:   PainSc: 4       Patients Stated Pain Goal: 4 (50/38/88 2800)  Complications: No complications documented.

## 2019-11-20 NOTE — Anesthesia Preprocedure Evaluation (Addendum)
Anesthesia Evaluation  Patient identified by MRN, date of birth, ID band Patient confused    Reviewed: Allergy & Precautions, NPO status , Patient's Chart, lab work & pertinent test results  History of Anesthesia Complications Negative for: history of anesthetic complications  Airway Mallampati: II  TM Distance: >3 FB Neck ROM: Full    Dental  (+) Upper Dentures, Lower Dentures   Pulmonary Current Smoker (smokes 1 to 2 packs per day) and Patient abstained from smoking.,    Pulmonary exam normal breath sounds clear to auscultation       Cardiovascular Exercise Tolerance: Poor hypertension, Pt. on medications  Rhythm:Regular Rate:Tachycardia     Neuro/Psych PSYCHIATRIC DISORDERS Anxiety Depression    GI/Hepatic negative GI ROS, (+)     substance abuse (heavy alcohol use)  alcohol use,   Endo/Other  negative endocrine ROS  Renal/GU negative Renal ROS     Musculoskeletal  (+) Arthritis ,   Abdominal   Peds  Hematology negative hematology ROS (+)   Anesthesia Other Findings   Reproductive/Obstetrics                         Anesthesia Physical Anesthesia Plan  ASA: IV  Anesthesia Plan: General   Post-op Pain Management:    Induction: Intravenous  PONV Risk Score and Plan: Ondansetron, Dexamethasone and Midazolam  Airway Management Planned: LMA  Additional Equipment:   Intra-op Plan:   Post-operative Plan: Extubation in OR  Informed Consent: I have reviewed the patients History and Physical, chart, labs and discussed the procedure including the risks, benefits and alternatives for the proposed anesthesia with the patient or authorized representative who has indicated his/her understanding and acceptance.     Consent reviewed with POA  Plan Discussed with: CRNA and Surgeon  Anesthesia Plan Comments:       Anesthesia Quick Evaluation

## 2019-11-20 NOTE — Op Note (Signed)
Orthopaedic Surgery Operative Note (CSN: [id number])  Lee Wang  12-24-1945 Date of Surgery: 11/20/2019   Diagnoses:  Right ankle fracture dislocation  Procedure: Closed reduction and splinting of bimalleolar right ankle fracture   Operative Finding Successful completion of the planned procedure.  Closed reduction and splint placed under general anesthesia.     Post-Op Diagnosis: Same Surgeons:Primary: Mordecai Rasmussen, MD Assistants:  Location: [facility] ROOM 3 Anesthesia: General Antibiotics: None indicated Tourniquet time: * No tourniquets in log * Estimated Blood Loss: None; no incisions made Complications: None Specimens: None Implants: * No implants in log *  Indications for Surgery:   Lee Wang is a 74 y.o. male with a right bimalleolar ankle fracture dislocation in the setting of previous ORIF of the right fibula.  Benefits and risks of operative and nonoperative management were discussed prior to surgery with patient/guardian(s) and informed consent form was completed.  Specific risks including infection, need for additional surgery, bleeding, nonunion, malunion or more severe complications associated with anesthesia.  Patient has a history of alcoholism, and is in a heavy smoker, admitting to at least 1 pack/day.  He also has poor social support and lives with himself.  As a result, he is at greater risk for infection and poor healing following surgery.  Nonetheless, he sustained a severe injury to his right ankle, and will require definitive operative fixation.   Procedure:   The patient was identified properly. Informed consent was obtained and the surgical site was marked. The patient was taken up to suite where general anesthesia was induced.  The patient was positioned supine.  At this point, the right ankle was evaluated deformity.  In addition there was significant swelling throughout the ankle.  There was a significant ecchymosis in the dependent areas of the  ankle.  Reportedly, he was started to develop some fracture blisters over the anterolateral, and lateral portions of the ankle.  As result, I attempted a closed reduction with direct visualization under fluoroscopy.  Once I was satisfied that we could achieve an adequate reduction, the ankle was then splinted and molded with a posterior slab and side struts.  Final fluoroscopic images confirmed adequate reduction.  Given the severity of the injury, he will require definitive fixation.  Due to the condition of the skin, will now wait for the swelling to improve.  At the completion of the splinting, the toes were warm and well perfused with excellent capillary refill.  The patient was then awoken from anesthesia and taken to the PACU in stable condition.  He will be admitted to the floor under the medicine service as he also has concomitant rib fractures and a history of alcoholism.   Post-operative plan:  The patient will be NWB RLE.   DVT prophylaxis per primary team, no orthopedic contraindications.   Recommend Asprin 81 mg twice daily Pain control with PRN pain medication preferring oral medicines.     I will plan to see him back in clinic in approximately 1 week to make plans for definitive fixation.  From an orthopedic perspective, he can be discharged home, once he is able to pass physical therapy and he has appropriate assistance at home.

## 2019-11-20 NOTE — Progress Notes (Signed)
Patient arrived to PACU with no bracelet, unable to scan medication. New bracelet printed by Glenis Smoker.

## 2019-11-20 NOTE — Anesthesia Procedure Notes (Signed)
Procedure Name: LMA Insertion Date/Time: 11/20/2019 2:35 PM Performed by: Eulas Post, Chelcey Caputo W, CRNA Pre-anesthesia Checklist: Patient identified, Emergency Drugs available, Suction available and Patient being monitored Patient Re-evaluated:Patient Re-evaluated prior to induction Oxygen Delivery Method: Circle system utilized Preoxygenation: Pre-oxygenation with 100% oxygen Induction Type: IV induction Ventilation: Mask ventilation without difficulty LMA: LMA inserted LMA Size: 4.0 Number of attempts: 1 Placement Confirmation: positive ETCO2 and breath sounds checked- equal and bilateral Tube secured with: Tape Dental Injury: Teeth and Oropharynx as per pre-operative assessment

## 2019-11-20 NOTE — H&P (Signed)
History and Physical  Lee Wang CBJ:[id number] DOB: 1945-06-19 DOA: 11/19/2019  Referring physician: Daleen Bo, MD PCP: Dione Housekeeper, MD  Patient coming from: Home  Chief Complaint: Right ankle pain  HPI: Lee Wang is a 74 y.o. male with medical history significant for hypertension and hyperlipidemia who presents to the emergency department due to right ankle pain secondary to a witnessed fall after he misstepped.  He noted right ankle inflammation and pain on ambulation, so he presented to the emergency department for further evaluation and management.   Patient endorsed occasional use of alcohol (beer) and states that he does not drink more than 12-15 beers in a month.  He denies chest pain, shortness of breath, nausea, vomiting or headache.  ED Course:  In the emergency department, he was hemodynamically stable.  Work-up in the ED showed normal CBC and BMP except for hyponatremia.  SARS coronavirus 2 was negative.  Chest x-ray showed acute left rib fractures with no pneumothorax.  Right ankle x-ray showed fracture dislocation of the ankle and acute fracture (avulsion) through the base of the fifth metatarsal.  Orthopedic surgeon was consulted and will see patient in the morning with plan to take patient to the OR per ED physician.  Hospitalist was asked to admit patient for further evaluation and management.  Review of Systems: Constitutional: Negative for chills and fever.  HENT: Negative for ear pain and sore throat.   Eyes: Negative for pain and visual disturbance.  Respiratory: Negative for cough, chest tightness and shortness of breath.   Cardiovascular: Negative for chest pain and palpitations.  Gastrointestinal: Negative for abdominal pain and vomiting.  Endocrine: Negative for polyphagia and polyuria.  Genitourinary: Negative for decreased urine volume, dysuria Musculoskeletal: Positive for inflammation of right ankle. Negative for back pain.  Skin: Negative  for color change and rash.  Allergic/Immunologic: Negative for immunocompromised state.  Neurological: Negative for tremors, syncope, speech difficulty, weakness, light-headedness and headaches.  Hematological: Does not bruise/bleed easily.  All other systems reviewed and are negative   Past Medical History:  Diagnosis Date  . Anxiety   . Arthritis   . Cancer [facility]) prostate   2013  . Depression   . Heavy drinker of alcohol   . Hyperlipidemia   . Hypertension    Past Surgical History:  Procedure Laterality Date  . CATARACT EXTRACTION W/PHACO Right 08/21/2013   Procedure: CATARACT EXTRACTION PHACO AND INTRAOCULAR LENS PLACEMENT (IOC);  Surgeon: Tonny Branch, MD;  Location: [facility];  Service: Ophthalmology;  Laterality: Right;  CDE 7.34  . CATARACT EXTRACTION W/PHACO Left 09/14/2013   Procedure: CATARACT EXTRACTION PHACO AND INTRAOCULAR LENS PLACEMENT (IOC);  Surgeon: Tonny Branch, MD;  Location: [facility];  Service: Ophthalmology;  Laterality: Left;  CDE: 11.50  . COLONOSCOPY  2011   Recc repeat 3 years external / anal canal hemorrhoids, otherwise normal rectum. 2. multiple colonic polyps with largest ulcerated pedunculated polyp in the midsigmoid, status post multiple  hot snare polypectomies. no evidence of colitis or diverticuliosis.   Marland Kitchen FRACTURE SURGERY Bilateral    ankle  . ORIF HUMERUS FRACTURE Left 10/14/2016   Procedure: OPEN REDUCTION INTERNAL FIXATION (ORIF) LEFT SHOULDER FRACTURE/DISLOCATION, OPEN REDUCTION INTERNAL FIXATION GREATER TUBEROSITY;  Surgeon: Marybelle Killings, MD;  Location: [place];  Service: Orthopedics;  Laterality: Left;  . PROSTATE SURGERY     radiation implants    Social History:  reports that he has been smoking cigarettes. He started smoking about 61 years ago. He has a 50.00  pack-year smoking history. His smokeless tobacco use includes chew. He reports current alcohol use of about 10.0 standard drinks of alcohol per week. He reports that he does not use drugs.   No  Known Allergies  Family History  Problem Relation Age of Onset  . Alzheimer's disease Mother   . Heart disease Father   . Alzheimer's disease Paternal Grandfather   . Colon cancer Neg Hx   . Liver disease Neg Hx     Prior to Admission medications   Medication Sig Start Date End Date Taking? Authorizing Provider  ALPRAZolam Duanne Moron) 0.5 MG tablet Take 1 tablet (0.5 mg total) by mouth 2 (two) times daily as needed. for anxiety Patient taking differently: Take 0.5 mg by mouth 2 (two) times daily as needed for anxiety. for anxiety 09/19/15   Dettinger, Fransisca Kaufmann, MD  aspirin 325 MG EC tablet Take 650 mg by mouth daily as needed for pain.    [provider]  atorvastatin (LIPITOR) 40 MG tablet Take 1 tablet (40 mg total) by mouth daily. Patient taking differently: Take 40 mg by mouth daily at 6 PM.  03/08/15   Claretta Fraise, MD  escitalopram (LEXAPRO) 10 MG tablet Take 1 tablet (10 mg total) by mouth daily. Patient not taking: Reported on 10/13/2016 09/19/15   Dettinger, Fransisca Kaufmann, MD  Fish Oil OIL Take 1 capsule by mouth daily. Reported on 03/08/2015    [provider]  oxyCODONE-acetaminophen (ROXICET) 5-325 MG tablet Take 1 tablet by mouth every 4 (four) hours as needed for severe pain. Patient not taking: Reported on 10/29/2016 10/15/16   Marybelle Killings, MD  propranolol (INDERAL) 20 MG tablet Take 1 tablet (20 mg total) by mouth 3 (three) times daily. Patient taking differently: Take 20 mg by mouth 2 (two) times daily.  09/19/15   Dettinger, Fransisca Kaufmann, MD    Physical Exam: BP 138/69 (BP Location: Left Arm)   Pulse 77   Temp 97.9 F (36.6 C) (Oral)   Resp 18   SpO2 98%   . General: 74 y.o. year-old male well developed well nourished in no acute distress.  Alert and oriented x3. Marland Kitchen HEENT: NCAT, EOMI . Neck: Supple, trachea medial . Cardiovascular: Regular rate and rhythm with no rubs or gallops.  No thyromegaly or JVD noted. 2/4 pulses in all 4 extremities. Marland Kitchen Respiratory: Clear  to auscultation with no wheezes or rales. Good inspiratory effort. . Abdomen: Soft nontender nondistended with normal bowel sounds x4 quadrants. . Muskuloskeletal: Right ankle inversion with inflammation.  No cyanosis. +2 dorsalis pedis pulse bilaterally . Neuro: CN II-XII intact, strength, sensation, reflexes . Skin: No ulcerative lesions noted or rashes . Psychiatry: Judgement and insight appear normal. Mood is appropriate for condition and setting          Labs on Admission:  Basic Metabolic Panel: Recent Labs  Lab 11/19/19 2254  NA 126*  K 4.3  CL 93*  CO2 23  GLUCOSE 96  BUN 6*  CREATININE 0.56*  CALCIUM 8.4*   Liver Function Tests: No results for input(s): AST, ALT, ALKPHOS, BILITOT, PROT, ALBUMIN in the last 168 hours. No results for input(s): LIPASE, AMYLASE in the last 168 hours. No results for input(s): AMMONIA in the last 168 hours. CBC: Recent Labs  Lab 11/19/19 2254  WBC 8.1  NEUTROABS 6.0  HGB 13.8  HCT 39.2  MCV 98.0  PLT 213   Cardiac Enzymes: No results for input(s): CKTOTAL, CKMB, CKMBINDEX, TROPONINI in the last 168  hours.  BNP (last 3 results) No results for input(s): BNP in the last 8760 hours.  ProBNP (last 3 results) No results for input(s): PROBNP in the last 8760 hours.  CBG: No results for input(s): GLUCAP in the last 168 hours.  Radiological Exams on Admission: DG Ankle Complete Right  Result Date: 11/19/2019 CLINICAL DATA:  Pain EXAM: RIGHT ANKLE - COMPLETE 3+ VIEW COMPARISON:  None. FINDINGS: There is an acute displaced trimalleolar fracture with disruption of the ankle mortise. There is significant medial displacement of the talus with respect to the distal tibia. There is a large cyst displaced fracture fragment measuring 4.5 cm involving the medial malleolus. There may be a subtle impaction fracture involving the lateral articular surface of the talus. There is extensive surrounding soft tissue swelling. The patient's prior plate  and screw fixation of the distal fibula is bent. There is an acute fracture through the base of the fifth metatarsal. The posterior calcaneus is suboptimally evaluated secondary to overlapping objects external to the patient. IMPRESSION: 1. Fracture dislocation of the ankle as detailed above. 2. Acute fracture through the base of the fifth metatarsal is favored to represent an avulsion fracture, however dedicated foot radiographs are recommended. Electronically Signed   By: Constance Holster M.D.   On: 11/19/2019 22:20   DG Chest Port 1 View  Result Date: 11/19/2019 CLINICAL DATA:  Fall EXAM: PORTABLE CHEST 1 VIEW COMPARISON:  None. FINDINGS: Lungs are clear. No pneumothorax or pleural effusion. Cardiac size within normal limits. Pulmonary vascularity is normal. There are probable acute fractures of the left 6, 7, and 8 ribs posterolaterally demonstrating minimal displacement. IMPRESSION: Acute left rib fractures.  No pneumothorax. Electronically Signed   By: Fidela Salisbury MD   On: 11/19/2019 23:20    EKG: I independently viewed the EKG done and my findings are as followed: Sinus rhythm at rate of 68 bpm with minimal ST elevation in inferior leads (with no change from EKG done on 06/17/2016)  Assessment/Plan Present on Admission: . Alcohol abuse . HTN (hypertension) . Hyperlipemia  Principal Problem:   Avulsion fracture of metatarsal bone of right foot with delayed healing Active Problems:   Alcohol abuse   HTN (hypertension)   Hyperlipemia   Left rib fracture   Hyponatremia  Right foot avulsion fracture secondary to mechanical fall Acute left rib fracture Right ankle x-ray showed fracture dislocation of the ankle and acute fracture (avulsion) through the base of the fifth metatarsal Patient will be placed n.p.o. in anticipation for possible surgical intervention in the morning Continue IV morphine 2 mg every 4 hours as needed for moderate/severe pain Continue fall precaution and  neurochecks Continue PT eval and treat Orthopedic surgery consulted by ED physician and will see patient in the morning with outpatient Ortho take patient to the OR for fracture repair  Hyponatremia possibly secondary to beer potomania Na 126, continue gentle hydration Orthostatic BP will be checked to rule out postural hypotension Continue to monitor sodium with serial BMPs Serum and urine osmolality and urine sodium will be checked  History of alcohol abuse Patient states that he only drinks 12-15 12Oz beer monthly Alcohol level pending Continue to monitor patient for alcohol withdrawal and start patient on CIWA protocol  History of hypertension (controlled)/hyperlipidemia Patient states that he does not take any medication, last updated med rec was 2018, we shall await updated med rec prior to continue patient's home meds  DVT prophylaxis: SCDs (no indication for any chemoprophylaxis at this time due  to possible surgical intervention in the morning)  Code Status: Full code  Family Communication: None at bedside  Disposition Plan:  Patient is from:                        home Anticipated DC to:                   SNF or family members home Anticipated DC date:               2-3 days Anticipated DC barriers:          Patient is not stable to be discharged at this time due to right foot fracture requiring possible surgical intervention.   Consults called: Orthopedic surgery  Admission status: Inpatient    Bernadette Hoit MD [facility]  11/20/2019, 3:26 AM

## 2019-11-20 NOTE — Progress Notes (Addendum)
PROGRESS NOTE  Lee Wang IFO:[id number] DOB: 1946/01/31 DOA: 11/19/2019 PCP: Lee Housekeeper, MD  Brief History:  74 year old male with a history of hypertension, anxiety, hyperlipidemia, and alcohol dependence presenting after a mechanical fall that was witnessed by his son.  Apparently, the patient was going upstairs and tripped falling onto his right side.  He injured his right ankle and had difficulty bearing weight.  As result, the patient was brought to the hospital for further evaluation.  He states that he had been in his usual state of health prior to the mechanical fall.  He denied loss of consciousness.  He denied any fevers, chills, chest pain, shortness breath.  He has a chronic cough.  He denied any nausea, vomiting, abdominal pain.  He has occasional loose stools without hematochezia or melena. Notably, the patient states that he has not seen a primary care physician for about 5 years.  When asked about his medications, he states that the only medication that he takes is some type of blood pressure medicine, but he is unsure how he is getting this refilled. In the emergency department, the patient was afebrile hemodynamically stable with oxygen saturation 97% room air.  BMP showed a sodium 126, potassium 4.3, bicarb 23, serum creatinine 0.56 peer WBC 8.1, hemoglobin 13.9, platelets 213,000.,  1121 right ankle x-ray showed acute displaced trimalleolar fracture of the right ankle with disruption of the ankle mortise.  There was medial displacement of the talus.  Previous screw fixation was bent at the distal  fibula.  Orthopedics was consulted and plans to take the patient to surgery on 11/20/2019.  Assessment/Plan: Right ankle fracture -11/20/2019 ankle x-ray as discussed above -Orthopedic consult appreciated -Planning for surgery 11/20/2019 -Remain n.p.o.  Hyponatremia -Secondary to poor solute intake and volume depletion -Improved with IV normal saline -Personally  reviewed EKG--sinus rhythm, early repolarization changes  Alcohol dependence -Alcohol withdrawal protocol -No signs of withdrawal at this time  Essential hypertension -Previously taking propranolol -Unclear how the patient is receiving refills as he has not seen a primary care provider for 5 years -Discontinue propranolol for now given soft blood pressures  Tobacco abuse -Patient presumptively has COPD -60-pack-year history -No desire to stop smoking presently -Personally reviewed chest x-ray--no consolidation or pulmonary edema  Hyperlipidemia -Previously on Lipitor -Check lipid panel  Anxiety -Previously taking Lexapro and Xanax -Has not taken these medications for a number of years now  Left Rib fractures -stable on RA   Status is: Inpatient  Remains inpatient appropriate because:Ongoing diagnostic testing needed not appropriate for outpatient work up   Dispo: The patient is from: Home              Anticipated d/c is to: Home              Anticipated d/c date is: 2 days              Patient currently is not medically stable to d/c.        Family Communication: no  Family at bedside  Consultants:  Ortho--[place]  Code Status:  FULL  DVT Prophylaxis:  [name]   Procedures: As Listed in Progress Note Above  Antibiotics: None       Subjective: Patient complains of pain in the right ankle.  He denies any fevers, chills, headache, neck pain, chest pain, shortness breath, nausea, vomiting or diarrhea, abdominal pain.  Objective: Vitals:   11/20/19 0100 11/20/19 0300 11/20/19  0430 11/20/19 0530  BP: 90/77 138/69 115/66 107/76  Pulse: 65 77 61 67  Resp: 15 18 11 14   Temp:      TempSrc:      SpO2: 97% 98% 97% 94%   No intake or output data in the 24 hours ending 11/20/19 0722 Weight change:  Exam:   General:  Pt is alert, follows commands appropriately, not in acute distress  HEENT: No icterus, No thrush, No neck mass,  [place]/AT  Cardiovascular: RRR, S1/S2, no rubs, no gallops  Respiratory: Diminished breath sounds.  Bibasilar rales.  Scattered wheeze.  Abdomen: Soft/+BS, non tender, non distended, no guarding  Extremities: Right ankle deformity with medial displacement.  There is edema about the right ankle without any crepitance.  There is scattered ecchymosis.   Data Reviewed: I have personally reviewed following labs and imaging studies Basic Metabolic Panel: Recent Labs  Lab 11/19/19 2254 11/20/19 0531  NA 126* 134*  K 4.3 3.9  CL 93* 101  CO2 23 22  GLUCOSE 96 74  BUN 6* 5*  CREATININE 0.56* 0.46*  CALCIUM 8.4* 8.7*  MG  --  1.7  PHOS  --  3.4   Liver Function Tests: Recent Labs  Lab 11/20/19 0531  AST 37  ALT 24  ALKPHOS 72  BILITOT 1.0  PROT 6.2*  ALBUMIN 3.6   No results for input(s): LIPASE, AMYLASE in the last 168 hours. No results for input(s): AMMONIA in the last 168 hours. Coagulation Profile: Recent Labs  Lab 11/20/19 0531  INR 1.1   CBC: Recent Labs  Lab 11/19/19 2254 11/20/19 0531  WBC 8.1 8.9  NEUTROABS 6.0  --   HGB 13.8 13.6  HCT 39.2 39.0  MCV 98.0 96.8  PLT 213 202   Cardiac Enzymes: No results for input(s): CKTOTAL, CKMB, CKMBINDEX, TROPONINI in the last 168 hours. BNP: Invalid input(s): POCBNP CBG: No results for input(s): GLUCAP in the last 168 hours. HbA1C: No results for input(s): HGBA1C in the last 72 hours. Urine analysis:    Component Value Date/Time   BILIRUBINUR neg 09/28/2014 1806   PROTEINUR neg 09/28/2014 1806   UROBILINOGEN negative 09/28/2014 1806   NITRITE neg 09/28/2014 1806   LEUKOCYTESUR Negative 09/28/2014 1806   Sepsis Labs: @LABRCNTIP (procalcitonin:4,lacticidven:4) ) Recent Results (from the past 240 hour(s))  Respiratory Panel by RT PCR (Flu A&B, Covid) - Nasopharyngeal Swab     Status: None   Collection Time: 11/19/19 10:55 PM   Specimen: Nasopharyngeal Swab  Result Value Ref Range Status   SARS  Coronavirus 2 by RT PCR NEGATIVE NEGATIVE Final    Comment: (NOTE) SARS-CoV-2 target nucleic acids are NOT DETECTED.  The SARS-CoV-2 RNA is generally detectable in upper respiratoy specimens during the acute phase of infection. The lowest concentration of SARS-CoV-2 viral copies this assay can detect is 131 copies/mL. A negative result does not preclude SARS-Cov-2 infection and should not be used as the sole basis for treatment or other patient management decisions. A negative result may occur with  improper specimen collection/handling, submission of specimen other than nasopharyngeal swab, presence of viral mutation(s) within the areas targeted by this assay, and inadequate number of viral copies (<131 copies/mL). A negative result must be combined with clinical observations, patient history, and epidemiological information. The expected result is Negative.  Fact Sheet for Patients:  PinkCheek.be  Fact Sheet for Healthcare Providers:  GravelBags.it  This test is no t yet approved or cleared by the [place] FDA and  has been  authorized for detection and/or diagnosis of SARS-CoV-2 by FDA under an Emergency Use Authorization (EUA). This EUA will remain  in effect (meaning this test can be used) for the duration of the COVID-19 declaration under Section 564(b)(1) of the Act, 21 U.S.C. section 360bbb-3(b)(1), unless the authorization is terminated or revoked sooner.     Influenza A by PCR NEGATIVE NEGATIVE Final   Influenza B by PCR NEGATIVE NEGATIVE Final    Comment: (NOTE) The Xpert Xpress SARS-CoV-2/FLU/RSV assay is intended as an aid in  the diagnosis of influenza from Nasopharyngeal swab specimens and  should not be used as a sole basis for treatment. Nasal washings and  aspirates are unacceptable for Xpert Xpress SARS-CoV-2/FLU/RSV  testing.  Fact Sheet for  Patients: PinkCheek.be  Fact Sheet for Healthcare Providers: GravelBags.it  This test is not yet approved or cleared by the [place] FDA and  has been authorized for detection and/or diagnosis of SARS-CoV-2 by  FDA under an Emergency Use Authorization (EUA). This EUA will remain  in effect (meaning this test can be used) for the duration of the  Covid-19 declaration under Section 564(b)(1) of the Act, 21  U.S.C. section 360bbb-3(b)(1), unless the authorization is  terminated or revoked. Performed at [facility], [address], [place], [place] [address]      Scheduled Meds: Continuous Infusions:  Procedures/Studies: DG Ankle Complete Right  Result Date: 11/19/2019 CLINICAL DATA:  Pain EXAM: RIGHT ANKLE - COMPLETE 3+ VIEW COMPARISON:  None. FINDINGS: There is an acute displaced trimalleolar fracture with disruption of the ankle mortise. There is significant medial displacement of the talus with respect to the distal tibia. There is a large cyst displaced fracture fragment measuring 4.5 cm involving the medial malleolus. There may be a subtle impaction fracture involving the lateral articular surface of the talus. There is extensive surrounding soft tissue swelling. The patient's prior plate and screw fixation of the distal fibula is bent. There is an acute fracture through the base of the fifth metatarsal. The posterior calcaneus is suboptimally evaluated secondary to overlapping objects external to the patient. IMPRESSION: 1. Fracture dislocation of the ankle as detailed above. 2. Acute fracture through the base of the fifth metatarsal is favored to represent an avulsion fracture, however dedicated foot radiographs are recommended. Electronically Signed   By: Constance Holster M.D.   On: 11/19/2019 22:20   DG Chest Port 1 View  Result Date: 11/19/2019 CLINICAL DATA:  Fall EXAM: PORTABLE CHEST 1 VIEW COMPARISON:  None.  FINDINGS: Lungs are clear. No pneumothorax or pleural effusion. Cardiac size within normal limits. Pulmonary vascularity is normal. There are probable acute fractures of the left 6, 7, and 8 ribs posterolaterally demonstrating minimal displacement. IMPRESSION: Acute left rib fractures.  No pneumothorax. Electronically Signed   By: Fidela Salisbury MD   On: 11/19/2019 23:20    Orson Eva, DO  [facility]  If 7PM-7AM, please contact night-coverage [email or web address] Password [email or web address] 11/20/2019, 7:22 AM   LOS: 0 days

## 2019-11-20 NOTE — ED Notes (Signed)
PT provided fresh bed linens and dry gown. Pt resting on stretcher in lowest position with rails up x 2 call light in hand. Pt VSS NAD PT remains calm cooperative at this time. Pt continues on cardiac monitor and room air.

## 2019-11-20 NOTE — ED Provider Notes (Signed)
[facility] EMERGENCY DEPARTMENT Provider Note   CSN: [id number] Arrival date & time: 11/19/19  2139     History Chief Complaint  Patient presents with   Ankle Pain    Lee Wang is a 74 y.o. male.  HPI Patient was at home today and had a witnessed fall, which caused him to turn his ankle.  Family member, son with the patient, in the room stating that it appeared to be a mechanical fall, when the patient misstepped.  He also injured his right elbow.  There was no head injury or complaint of neck or back injuries.  Patient's behavior has been normal since then.  No other recent illnesses.  There are no other known modifying factors.    Past Medical History:  Diagnosis Date   Anxiety    Arthritis    Cancer [facility]) prostate   2013   Depression    Heavy drinker of alcohol    Hyperlipidemia    Hypertension     Patient Active Problem List   Diagnosis Date Noted   Greater tuberosity of humerus fracture 10/14/2016   Closed fracture dislocation of joint of left shoulder girdle 10/08/2016   GAD (generalized anxiety disorder) 03/09/2015   Elevated LFTs 11/05/2014   Abnormal [place] (ultrasound) of abdomen 11/05/2014   Hepatomegaly 11/05/2014   History of colonic polyps 11/05/2014   HTN (hypertension) 05/28/2010   Hyperlipemia 05/28/2010   Depression with anxiety 05/28/2010   Prostate CA ([place]) 05/28/2010   BPH (benign prostatic hyperplasia) 05/28/2010   Alcohol abuse 07/18/2009   HEMATOCHEZIA 07/18/2009   DIARRHEA 07/18/2009   ABDOMINAL PAIN, UNSPECIFIED SITE 07/18/2009    Past Surgical History:  Procedure Laterality Date   CATARACT EXTRACTION W/PHACO Right 08/21/2013   Procedure: CATARACT EXTRACTION PHACO AND INTRAOCULAR LENS PLACEMENT ([place]);  Surgeon: Tonny Branch, MD;  Location: [facility];  Service: Ophthalmology;  Laterality: Right;  CDE 7.34   CATARACT EXTRACTION W/PHACO Left 09/14/2013   Procedure: CATARACT EXTRACTION PHACO AND INTRAOCULAR LENS  PLACEMENT ([facility]);  Surgeon: Tonny Branch, MD;  Location: [facility];  Service: Ophthalmology;  Laterality: Left;  CDE: 11.50   COLONOSCOPY  2011   Recc repeat 3 years external / anal canal hemorrhoids, otherwise normal rectum. 2. multiple colonic polyps with largest ulcerated pedunculated polyp in the midsigmoid, status post multiple  hot snare polypectomies. no evidence of colitis or diverticuliosis.    FRACTURE SURGERY Bilateral    ankle   ORIF HUMERUS FRACTURE Left 10/14/2016   Procedure: OPEN REDUCTION INTERNAL FIXATION (ORIF) LEFT SHOULDER FRACTURE/DISLOCATION, OPEN REDUCTION INTERNAL FIXATION GREATER TUBEROSITY;  Surgeon: Marybelle Killings, MD;  Location: [facility];  Service: Orthopedics;  Laterality: Left;   PROSTATE SURGERY     radiation implants       Family History  Problem Relation Age of Onset   Alzheimer's disease Mother    Heart disease Father    Alzheimer's disease Paternal Grandfather    Colon cancer Neg Hx    Liver disease Neg Hx     Social History   Tobacco Use   Smoking status: Current Every Day Smoker    Packs/day: 1.00    Years: 50.00    Pack years: 50.00    Types: Cigarettes    Start date: 08/09/1958   Smokeless tobacco: Current User    Types: Chew  Vaping Use   Vaping Use: Never used  Substance Use Topics   Alcohol use: Yes    Alcohol/week: 10.0 standard drinks    Types: 10  Standard drinks or equivalent per week    Comment: drinks at least once a week, minimum 6 drinks per occurrence.   Drug use: No    Home Medications Prior to Admission medications   Medication Sig Start Date End Date Taking? Authorizing Provider  ALPRAZolam Duanne Moron) 0.5 MG tablet Take 1 tablet (0.5 mg total) by mouth 2 (two) times daily as needed. for anxiety Patient taking differently: Take 0.5 mg by mouth 2 (two) times daily as needed for anxiety. for anxiety 09/19/15   Dettinger, Fransisca Kaufmann, MD  aspirin 325 MG EC tablet Take 650 mg by mouth daily as needed for pain.    [provider]  atorvastatin (LIPITOR) 40 MG tablet Take 1 tablet (40 mg total) by mouth daily. Patient taking differently: Take 40 mg by mouth daily at 6 PM.  03/08/15   Claretta Fraise, MD  escitalopram (LEXAPRO) 10 MG tablet Take 1 tablet (10 mg total) by mouth daily. Patient not taking: Reported on 10/13/2016 09/19/15   Dettinger, Fransisca Kaufmann, MD  Fish Oil OIL Take 1 capsule by mouth daily. Reported on 03/08/2015    [provider]  oxyCODONE-acetaminophen (ROXICET) 5-325 MG tablet Take 1 tablet by mouth every 4 (four) hours as needed for severe pain. Patient not taking: Reported on 10/29/2016 10/15/16   Marybelle Killings, MD  propranolol (INDERAL) 20 MG tablet Take 1 tablet (20 mg total) by mouth 3 (three) times daily. Patient taking differently: Take 20 mg by mouth 2 (two) times daily.  09/19/15   Dettinger, Fransisca Kaufmann, MD    Allergies    Patient has no known allergies.  Review of Systems   Review of Systems  All other systems reviewed and are negative.   Physical Exam Updated Vital Signs BP 117/74 (BP Location: Right Arm)    Pulse 69    Temp 97.9 F (36.6 C) (Oral)    Resp 18    SpO2 99%   Physical Exam Vitals and nursing note reviewed.  Constitutional:      General: He is not in acute distress.    Appearance: He is well-developed. He is not ill-appearing, toxic-appearing or diaphoretic.  HENT:     Head: Normocephalic and atraumatic.     Right Ear: External ear normal.     Left Ear: External ear normal.     Nose: No congestion.  Eyes:     Conjunctiva/sclera: Conjunctivae normal.     Pupils: Pupils are equal, round, and reactive to light.  Neck:     Trachea: Phonation normal.  Cardiovascular:     Rate and Rhythm: Normal rate and regular rhythm.     Heart sounds: Normal heart sounds.  Pulmonary:     Effort: Pulmonary effort is normal. No respiratory distress.     Breath sounds: Normal breath sounds. No stridor.  Chest:     Chest wall: No tenderness (No crepitation or  deformity).  Abdominal:     General: There is no distension.     Palpations: Abdomen is soft.     Tenderness: There is no abdominal tenderness.  Musculoskeletal:     Cervical back: Normal range of motion and neck supple.     Comments: Inversion deformity right ankle.  No skin break.  Neurovascularly intact distally in the right foot.  No other large joint injury to the right leg or other extremities.  Skin:    General: Skin is warm and dry.     Comments: Abrasion right elbow with mild bleeding  Neurological:     Mental Status: He is alert and oriented to person, place, and time.     Cranial Nerves: No cranial nerve deficit.     Sensory: No sensory deficit.     Motor: No abnormal muscle tone.     Coordination: Coordination normal.  Psychiatric:        Mood and Affect: Mood normal.        Behavior: Behavior normal.        Thought Content: Thought content normal.        Judgment: Judgment normal.     ED Results / Procedures / Treatments   Labs (all labs ordered are listed, but only abnormal results are displayed) Labs Reviewed  BASIC METABOLIC PANEL - Abnormal; Notable for the following components:      Result Value   Sodium 126 (*)    Chloride 93 (*)    BUN 6 (*)    Creatinine, Ser 0.56 (*)    Calcium 8.4 (*)    All other components within normal limits  CBC WITH DIFFERENTIAL/PLATELET - Abnormal; Notable for the following components:   RBC 4.00 (*)    MCH 34.5 (*)    All other components within normal limits  RESPIRATORY PANEL BY RT PCR (FLU A&B, COVID)  ETHANOL    EKG EKG Interpretation  Date/Time:  Sunday November 19 2019 23:05:10 EDT Ventricular Rate:  68 PR Interval:    QRS Duration: 97 QT Interval:  426 QTC Calculation: 454 R Axis:   53 Text Interpretation: Sinus rhythm Minimal ST elevation, inferior leads since last tracing no significant change Confirmed by Daleen Bo [phone]) on 11/20/2019 12:30:43 AM   Radiology DG Ankle Complete Right  Result  Date: 11/19/2019 CLINICAL DATA:  Pain EXAM: RIGHT ANKLE - COMPLETE 3+ VIEW COMPARISON:  None. FINDINGS: There is an acute displaced trimalleolar fracture with disruption of the ankle mortise. There is significant medial displacement of the talus with respect to the distal tibia. There is a large cyst displaced fracture fragment measuring 4.5 cm involving the medial malleolus. There may be a subtle impaction fracture involving the lateral articular surface of the talus. There is extensive surrounding soft tissue swelling. The patient's prior plate and screw fixation of the distal fibula is bent. There is an acute fracture through the base of the fifth metatarsal. The posterior calcaneus is suboptimally evaluated secondary to overlapping objects external to the patient. IMPRESSION: 1. Fracture dislocation of the ankle as detailed above. 2. Acute fracture through the base of the fifth metatarsal is favored to represent an avulsion fracture, however dedicated foot radiographs are recommended. Electronically Signed   By: Constance Holster M.D.   On: 11/19/2019 22:20   DG Chest Port 1 View  Result Date: 11/19/2019 CLINICAL DATA:  Fall EXAM: PORTABLE CHEST 1 VIEW COMPARISON:  None. FINDINGS: Lungs are clear. No pneumothorax or pleural effusion. Cardiac size within normal limits. Pulmonary vascularity is normal. There are probable acute fractures of the left 6, 7, and 8 ribs posterolaterally demonstrating minimal displacement. IMPRESSION: Acute left rib fractures.  No pneumothorax. Electronically Signed   By: Fidela Salisbury MD   On: 11/19/2019 23:20    Procedures Procedures (including critical care time)  Medications Ordered in ED Medications - No data to display  ED Course  I have reviewed the triage vital signs and the nursing notes.  Pertinent labs & imaging results that were available during my care of the patient were reviewed by me and considered in my  medical decision making (see chart for  details).  Clinical Course as of Nov 20 30  Sun Nov 19, 2019  2254 Case discussed with orthopedics who will see the patient as a Optometrist, toorrow to arrange for surgery. Request admission by hospitalist.   [EW]  Mon Nov 20, 2019  0002 Normal  Respiratory Panel by RT PCR (Flu A&B, Covid) - Nasopharyngeal Swab [EW]  0002 Normal  CBC with Differential(!) [EW]  0002 Normal except sodium low, chloride low, BUN low, creatinine low, calcium low  Basic metabolic panel(!) [EW]    Clinical Course User Index [EW] Daleen Bo, MD   MDM Rules/Calculators/A&P                           Patient Vitals for the past 24 hrs:  BP Temp Temp src Pulse Resp SpO2  11/19/19 2330 117/74 -- -- 69 18 99 %  11/19/19 2152 137/85 97.9 F (36.6 C) Oral 73 18 100 %    12:10 AM Reevaluation with update and discussion. After initial assessment and treatment, an updated evaluation reveals no chest wall tenderness or deformity.  Patient remains comfortable.  Disposition findings discussed with patient and son in the room, all questions answered. Daleen Bo   Medical Decision Making:  This patient is presenting for evaluation of injury to right ankle from fall, which does require a range of treatment options, and is a complaint that involves a moderate risk of morbidity and mortality. The differential diagnoses include sprain, fracture, acute medical disorder. I decided to review old records, and in summary debilitated male who has alcohol abuse problem, presenting for evaluation of fall..  I obtained additional historical information from son at the bedside.  Clinical Laboratory Tests Ordered, included CBC, Metabolic panel and Covid test. Review indicates metabolic disorder, mild, hyponatremia, hypochloremia. Radiologic Tests Ordered, included right ankle and chest, I independently Visualized: Radiographic images, which show trimalleolar fracture displaced right ankle    Critical  Interventions-clinical evaluation, radiography, laboratory testing, observation reassessment  After These Interventions, the Patient was reevaluated and was found to require hospitalization for medical clearance and stabilization for surgery.  Plan surgery tomorrow by orthopedics, n.p.o. after midnight.  CRITICAL CARE-no Performed by: Daleen Bo   Nursing Notes Reviewed/ Care Coordinated Applicable Imaging Reviewed Interpretation of Laboratory Data incorporated into ED treatment   12:14 AM-Consult complete with hospitalist. Patient case explained and discussed.    Plan: Admit       Final Clinical Impression(s) / ED Diagnoses Final diagnoses:  Closed trimalleolar fracture of right ankle, initial encounter  Hyponatremia  Alcoholism ([place])    Rx / DC Orders ED Discharge Orders    None       Daleen Bo, MD 11/20/19 [phone]

## 2019-11-20 NOTE — Consult Note (Addendum)
ORTHOPAEDIC CONSULTATION  REQUESTING PHYSICIAN: Orson Eva, MD    ASSESSMENT AND PLAN: 74 y.o. male with the following: Right ankle fracture dislocation  This patient requires inpatient admission to manage this problem appropriately.  Please admit to the medical service for medical optimization prior to surgery.  - Weight Bearing Status/Activity: Nonweightbearing right lower extremity  - Additional recommended labs/tests: CBC, BMP, INR  -VTE Prophylaxis: At the discretion of primary team, recommend 81 mg aspirin x 6 weeks  - Pain control: Multimodal, transition to p.o. medications as soon as possible  - Follow-up plan: We will continue to follow patient while in hospital, and plan to see him approximately 2 weeks after surgery  -Procedures: Case has been posted for November 20, 2019; plan for operative fixation of right ankle fracture  Given the patient's heavy tobacco use, I have advised him to work on significantly reducing and/or quitting while he recovers from his ankle injury.  Otherwise, he is at increased risk for infection and nonunion of his ankle.  Chief Complaint: Right ankle pain  HPI: Lee Wang is a 74 y.o. male with past medical history as outlined below, who sustained a mechanical fall prior to presentation to the emergency department.  He does have remote history of an ankle fracture, requiring surgery, although he is not sure of how long ago this was.  He noted immediate pain and deformity in his right ankle.  He also sustained some abrasions to his right elbow, and according to documentation he has some rib fractures.  He denies any numbness or tingling.  He does endorse pain in the ankle, which is worse this morning.  He states he does not drink on a daily basis, although he does have a history of heavy alcohol use.  In addition, he smokes 1 pack of cigarettes per day.  Past Medical History:  Diagnosis Date  . Anxiety   . Arthritis   . Cancer [facility])  prostate   2013  . Depression   . Heavy drinker of alcohol   . Hyperlipidemia   . Hypertension    Past Surgical History:  Procedure Laterality Date  . CATARACT EXTRACTION W/PHACO Right 08/21/2013   Procedure: CATARACT EXTRACTION PHACO AND INTRAOCULAR LENS PLACEMENT (IOC);  Surgeon: Tonny Branch, MD;  Location: [facility];  Service: Ophthalmology;  Laterality: Right;  CDE 7.34  . CATARACT EXTRACTION W/PHACO Left 09/14/2013   Procedure: CATARACT EXTRACTION PHACO AND INTRAOCULAR LENS PLACEMENT (IOC);  Surgeon: Tonny Branch, MD;  Location: [facility];  Service: Ophthalmology;  Laterality: Left;  CDE: 11.50  . COLONOSCOPY  2011   Recc repeat 3 years external / anal canal hemorrhoids, otherwise normal rectum. 2. multiple colonic polyps with largest ulcerated pedunculated polyp in the midsigmoid, status post multiple  hot snare polypectomies. no evidence of colitis or diverticuliosis.   Marland Kitchen FRACTURE SURGERY Bilateral    ankle  . ORIF HUMERUS FRACTURE Left 10/14/2016   Procedure: OPEN REDUCTION INTERNAL FIXATION (ORIF) LEFT SHOULDER FRACTURE/DISLOCATION, OPEN REDUCTION INTERNAL FIXATION GREATER TUBEROSITY;  Surgeon: Marybelle Killings, MD;  Location: [place];  Service: Orthopedics;  Laterality: Left;  . PROSTATE SURGERY     radiation implants   Social History   Socioeconomic History  . Marital status: Divorced    Spouse name: Not on file  . Number of children: Not on file  . Years of education: Not on file  . Highest education level: Not on file  Occupational History  . Not on file  Tobacco Use  .  Smoking status: Current Every Day Smoker    Packs/day: 1.00    Years: 50.00    Pack years: 50.00    Types: Cigarettes    Start date: 08/09/1958  . Smokeless tobacco: Current User    Types: Chew  Vaping Use  . Vaping Use: Never used  Substance and Sexual Activity  . Alcohol use: Yes    Alcohol/week: 10.0 standard drinks    Types: 10 Standard drinks or equivalent per week    Comment: drinks at least once a week,  minimum 6 drinks per occurrence.  . Drug use: No  . Sexual activity: Not on file  Other Topics Concern  . Not on file  Social History Narrative  . Not on file   Social Determinants of Health   Financial Resource Strain:   . Difficulty of Paying Living Expenses: Not on file  Food Insecurity:   . Worried About Charity fundraiser in the Last Year: Not on file  . Ran Out of Food in the Last Year: Not on file  Transportation Needs:   . Lack of Transportation (Medical): Not on file  . Lack of Transportation (Non-Medical): Not on file  Physical Activity:   . Days of Exercise per Week: Not on file  . Minutes of Exercise per Session: Not on file  Stress:   . Feeling of Stress : Not on file  Social Connections:   . Frequency of Communication with Friends and Family: Not on file  . Frequency of Social Gatherings with Friends and Family: Not on file  . Attends Religious Services: Not on file  . Active Member of Clubs or Organizations: Not on file  . Attends Archivist Meetings: Not on file  . Marital Status: Not on file   Family History  Problem Relation Age of Onset  . Alzheimer's disease Mother   . Heart disease Father   . Alzheimer's disease Paternal Grandfather   . Colon cancer Neg Hx   . Liver disease Neg Hx    No Known Allergies Prior to Admission medications   Medication Sig Start Date End Date Taking? Authorizing Provider  propranolol (INDERAL) 20 MG tablet Take 1 tablet (20 mg total) by mouth 3 (three) times daily. Patient taking differently: Take 20 mg by mouth 2 (two) times daily.  09/19/15  Yes Dettinger, Fransisca Kaufmann, MD  ALPRAZolam Duanne Moron) 0.5 MG tablet Take 1 tablet (0.5 mg total) by mouth 2 (two) times daily as needed. for anxiety Patient not taking: Reported on 11/20/2019 09/19/15   Dettinger, Fransisca Kaufmann, MD  atorvastatin (LIPITOR) 40 MG tablet Take 1 tablet (40 mg total) by mouth daily. Patient not taking: Reported on 11/20/2019 03/08/15   Claretta Fraise, MD   escitalopram (LEXAPRO) 10 MG tablet Take 1 tablet (10 mg total) by mouth daily. Patient not taking: Reported on 10/13/2016 09/19/15   Dettinger, Fransisca Kaufmann, MD  oxyCODONE-acetaminophen (ROXICET) 5-325 MG tablet Take 1 tablet by mouth every 4 (four) hours as needed for severe pain. Patient not taking: Reported on 10/29/2016 10/15/16   Marybelle Killings, MD   DG Ankle Complete Right  Result Date: 11/19/2019 CLINICAL DATA:  Pain EXAM: RIGHT ANKLE - COMPLETE 3+ VIEW COMPARISON:  None. FINDINGS: There is an acute displaced trimalleolar fracture with disruption of the ankle mortise. There is significant medial displacement of the talus with respect to the distal tibia. There is a large cyst displaced fracture fragment measuring 4.5 cm involving the medial malleolus. There may be a  subtle impaction fracture involving the lateral articular surface of the talus. There is extensive surrounding soft tissue swelling. The patient's prior plate and screw fixation of the distal fibula is bent. There is an acute fracture through the base of the fifth metatarsal. The posterior calcaneus is suboptimally evaluated secondary to overlapping objects external to the patient. IMPRESSION: 1. Fracture dislocation of the ankle as detailed above. 2. Acute fracture through the base of the fifth metatarsal is favored to represent an avulsion fracture, however dedicated foot radiographs are recommended. Electronically Signed   By: Constance Holster M.D.   On: 11/19/2019 22:20   DG Chest Port 1 View  Result Date: 11/19/2019 CLINICAL DATA:  Fall EXAM: PORTABLE CHEST 1 VIEW COMPARISON:  None. FINDINGS: Lungs are clear. No pneumothorax or pleural effusion. Cardiac size within normal limits. Pulmonary vascularity is normal. There are probable acute fractures of the left 6, 7, and 8 ribs posterolaterally demonstrating minimal displacement. IMPRESSION: Acute left rib fractures.  No pneumothorax. Electronically Signed   By: Fidela Salisbury MD   On:  11/19/2019 23:20   Family History Reviewed and non-contributory, no pertinent history of problems with bleeding or anesthesia      Review of Systems  No numbness or tingling No fevers or chills No shortness of breath   OBJECTIVE  Vitals: Patient Vitals for the past 8 hrs:  BP Pulse Resp SpO2  11/20/19 0530 107/76 67 14 94 %  11/20/19 0430 115/66 61 11 97 %  11/20/19 0300 138/69 77 18 98 %   General: Alert, no acute distress.  In some obvious discomfort. Cardiovascular: Warm extremities noted Respiratory: No cyanosis, no use of accessory musculature Neurologic: Sensation intact distally save for the below mentioned MSK exam Psychiatric: Patient is competent for consent with normal mood and affect Lymphatic: No swelling obvious and reported other than the area involved in the exam below Extremities  RUE: He has full range of his right upper extremity, he does have some superficial abrasions on the lateral aspect of his elbow.  No numbness or tingling in his hands. RLE: Significant swelling, bruising of the right ankle.  There are some small fracture blisters on the distal and lateral aspect.  Obvious deformity.  He is able to dorsiflex his ankle and his great toe.  Toes are warm and well perfused.  TTP about the ankle and the base of the 5th metatarsal LLE: No obvious deformity.  Full range of motion.  Sensation is intact.    Test Results Imaging X-ray of the right ankle demonstrates a fracture dislocation with failure of the previous hardware in the fibula.  Fracture at the base of the 5th metatarsal  Labs cbc Recent Labs    11/19/19 2254 11/20/19 0531  WBC 8.1 8.9  HGB 13.8 13.6  HCT 39.2 39.0  PLT 213 202    Labs inflam  Labs coag Recent Labs    11/20/19 0531  INR 1.1    Recent Labs    11/19/19 2254 11/20/19 0531  NA 126* 134*  K 4.3 3.9  CL 93* 101  CO2 23 22  GLUCOSE 96 74  BUN 6* 5*  CREATININE 0.56* 0.46*  CALCIUM 8.4* 8.7*

## 2019-11-20 NOTE — Progress Notes (Signed)
   ORTHOPAEDIC PROGRESS NOTE  s/p Procedure(s): CLOSED REDUCTION R ANKLE  SUBJECTIVE: Pain is well controlled.  Patient still sleepy following procedure.  No numbness.   OBJECTIVE: PE:  Vitals:   11/20/19 1750 11/20/19 1751  BP:  108/65  Pulse:  90  Resp:  16  Temp: 98.3 F (36.8 C) 98.3 F (36.8 C)  SpO2:     Sleeping, but easily aroused Sensation intact to exposed toes Wiggles exposed toes Splint is clean, dry and intact     ASSESSMENT: Lee Wang is a 74 y.o. male doing well postoperatively.  PLAN: Weightbearing: NWB RLE  PT/OT Insicional and dressing care: Dressings left intact until follow-up Orthopedic device(s): Splint; keep clean and dry Showering: None VTE prophylaxis: discretion of primary team; recommend aspirin 81 mg x 6 weeks Pain control: Multimodal; elevate leg to assist with pain control and swelling Follow - up plan: 1 week  Ok to DC from my perspective; will schedule follow up appointment to discuss definitive fixation   Mithcell Schumpert A. Amedeo [name], MD [facility] [address] [place],    [address] Phone: [phone] Fax: [phone]

## 2019-11-21 ENCOUNTER — Telehealth: Payer: Self-pay | Admitting: Orthopedic Surgery

## 2019-11-21 ENCOUNTER — Encounter (HOSPITAL_COMMUNITY): Payer: Self-pay | Admitting: Orthopedic Surgery

## 2019-11-21 ENCOUNTER — Ambulatory Visit: Payer: Self-pay | Admitting: Orthopedic Surgery

## 2019-11-21 DIAGNOSIS — S82891A Other fracture of right lower leg, initial encounter for closed fracture: Secondary | ICD-10-CM

## 2019-11-21 LAB — BASIC METABOLIC PANEL
Anion gap: 11 (ref 5–15)
BUN: 6 mg/dL — ABNORMAL LOW (ref 8–23)
CO2: 24 mmol/L (ref 22–32)
Calcium: 8.8 mg/dL — ABNORMAL LOW (ref 8.9–10.3)
Chloride: 98 mmol/L (ref 98–111)
Creatinine, Ser: 0.51 mg/dL — ABNORMAL LOW (ref 0.61–1.24)
GFR, Estimated: >60 mL/min (ref >60)
Glucose, Bld: 86 mg/dL (ref 70–99)
Potassium: 3.6 mmol/L (ref 3.5–5.1)
Sodium: 133 mmol/L — ABNORMAL LOW (ref 135–145)

## 2019-11-21 LAB — CBC
HCT: 34.4 % — ABNORMAL LOW (ref 39.0–52.0)
Hemoglobin: 12.3 g/dL — ABNORMAL LOW (ref 13.0–17.0)
MCH: 34.5 pg — ABNORMAL HIGH (ref 26.0–34.0)
MCHC: 35.8 g/dL (ref 30.0–36.0)
MCV: 96.4 fL (ref 80.0–100.0)
Platelets: 157 10*3/uL (ref 150–400)
RBC: 3.57 MIL/uL — ABNORMAL LOW (ref 4.22–5.81)
RDW: 12.4 % (ref 11.5–15.5)
WBC: 6.7 10*3/uL (ref 4.0–10.5)
nRBC: 0 % (ref 0.0–0.2)

## 2019-11-21 LAB — MAGNESIUM: Magnesium: 1.7 mg/dL (ref 1.7–2.4)

## 2019-11-21 MED ORDER — POTASSIUM CHLORIDE IN NACL 20-0.9 MEQ/L-% IV SOLN: INTRAVENOUS | Status: DC

## 2019-11-21 MED ORDER — PROPRANOLOL HCL 20 MG PO TABS
10.0000 mg | ORAL_TABLET | Freq: Three times a day (TID) | ORAL | Status: DC
  Administered 2019-11-21 – 2019-11-22 (×2): 10 mg via ORAL
  Filled 2019-11-21 (×2): qty 1

## 2019-11-21 MED ORDER — MAGNESIUM SULFATE 2 GM/50ML IV SOLN
2.0000 g | Freq: Once | INTRAVENOUS | Status: AC
  Administered 2019-11-21: 2 g via INTRAVENOUS
  Filled 2019-11-21: qty 50

## 2019-11-21 MED ORDER — SODIUM CHLORIDE 0.9 % IV BOLUS
1000.0000 mL | Freq: Once | INTRAVENOUS | Status: AC
  Administered 2019-11-21: 1000 mL via INTRAVENOUS

## 2019-11-21 NOTE — Telephone Encounter (Signed)
I called and spoke with Lee Wang and she states the patient is still in the hospital and they have not done his surgery yet.

## 2019-11-21 NOTE — Evaluation (Signed)
Physical Therapy Evaluation Patient Details Name: Lee Wang MRN: [id number] DOB: 05/17/1945 Today's Date: 11/21/2019   History of Present Illness  Lee Wang is a 74 y.o. male with past medical history as outlined below, who sustained a mechanical fall prior to presentation to the emergency department.  He does have remote history of an ankle fracture, requiring surgery, although he is not sure of how long ago this was.  He noted immediate pain and deformity in his right ankle. Pt is s/p Closed reduction and splinting of bimalleolar right ankle fracture on 11/20/19    Clinical Impression  Patient demonstrates fair return for maintaining NWB RLE using RW, has to touch right foot on floor when taking standing rest breaks, tendency to take short steps hopping on LLE and fatigues easily.  Patient instructed to take swing through steps using RW with NWB RLE demonstrating fair/poor carryover mostly due to weakness and fatigue.  Patient tolerated sitting up in chair after therapy.  Patient will benefit from continued physical therapy in hospital and recommended venue below to increase strength, balance, endurance for safe ADLs and gait.     Follow Up Recommendations SNF;Supervision for mobility/OOB;Supervision - Intermittent    Equipment Recommendations  Rolling walker with 5" wheels    Recommendations for Other Services       Precautions / Restrictions Precautions Precautions: Fall Restrictions Weight Bearing Restrictions: Yes RLE Weight Bearing: Non weight bearing      Mobility  Bed Mobility Overal bed mobility: Modified Independent             General bed mobility comments: increased time  Transfers Overall transfer level: Needs assistance Equipment used: Rolling walker (2 wheeled) Transfers: Sit to/from [facility] Sit to Stand: Min assist Stand pivot transfers: Min assist       General transfer comment: increased time, labored movement, fair  return for keeping right foot off floor  Ambulation/Gait Ambulation/Gait assistance: Min assist Gait Distance (Feet): 45 Feet Assistive device: Rolling walker (2 wheeled) Gait Pattern/deviations: Decreased step length - right;Decreased step length - left;Decreased stride length;Step-to pattern Gait velocity: decreased   General Gait Details: slow labored movement with fair return for maintaining NWB RLE using RW, frequently rest right foot on floor once fatigued, has diffiuclty with swing through stepping on LLE, tends to hop and fatigues easily  Stairs            Wheelchair Mobility    Modified Rankin (Stroke Patients Only)       Balance Overall balance assessment: Needs assistance Sitting-balance support: Feet supported;No upper extremity supported Sitting balance-Leahy Scale: Good Sitting balance - Comments: seated at EOB   Standing balance support: During functional activity;Bilateral upper extremity supported Standing balance-Leahy Scale: Fair Standing balance comment: using RW with NWB RLE                             Pertinent Vitals/Pain Pain Assessment: 0-10 Faces Pain Scale: Hurts a little bit Pain Location: right ankle Pain Descriptors / Indicators: Discomfort;Grimacing Pain Intervention(s): Limited activity within patient's tolerance;Monitored during session    Home Living Family/patient expects to be discharged to:: Private residence   Available Help at Discharge: Family;Available PRN/intermittently Type of Home: Mobile home Home Access: Stairs to enter Entrance Stairs-Rails: None Entrance Stairs-Number of Steps: 3 Home Layout: One level Home Equipment: None      Prior Function Level of Independence: Independent  Comments: community ambulator, drives     Hand Dominance   Dominant Hand: Right    Extremity/Trunk Assessment   Upper Extremity Assessment Upper Extremity Assessment: Defer to OT evaluation    Lower  Extremity Assessment Lower Extremity Assessment: Generalized weakness;RLE deficits/detail RLE Deficits / Details: grossly -4/5 except ankle/foot not test due to splinted RLE Sensation: WNL RLE Coordination: WNL    Cervical / Trunk Assessment Cervical / Trunk Assessment: Normal  Communication   Communication: No difficulties  Cognition Arousal/Alertness: Awake/alert Behavior During Therapy: WFL for tasks assessed/performed Overall Cognitive Status: Within Functional Limits for tasks assessed                                        General Comments      Exercises     Assessment/Plan    PT Assessment Patient needs continued PT services  PT Problem List Decreased strength;Decreased activity tolerance;Decreased balance;Decreased mobility       PT Treatment Interventions Balance training;Gait training;Stair training;Functional mobility training;DME instruction;Therapeutic activities;Therapeutic exercise;Patient/family education    PT Goals (Current goals can be found in the Care Plan section)  Acute Rehab PT Goals Patient Stated Goal: return home with family to assist PT Goal Formulation: With patient Time For Goal Achievement: 12/05/19 Potential to Achieve Goals: Good    Frequency Min 3X/week   Barriers to discharge        Co-evaluation PT/OT/SLP Co-Evaluation/Treatment: Yes Reason for Co-Treatment: Complexity of the patient's impairments (multi-system involvement);To address functional/ADL transfers PT goals addressed during session: Mobility/safety with mobility;Proper use of DME;Balance         AM-PAC PT "6 Clicks" Mobility  Outcome Measure Help needed turning from your back to your side while in a flat bed without using bedrails?: None Help needed moving from lying on your back to sitting on the side of a flat bed without using bedrails?: None Help needed moving to and from a bed to a chair (including a wheelchair)?: A Little Help needed  standing up from a chair using your arms (e.g., wheelchair or bedside chair)?: A Little Help needed to walk in hospital room?: A Lot Help needed climbing 3-5 steps with a railing? : A Lot 6 Click Score: 18    End of Session   Activity Tolerance: Patient tolerated treatment well;Patient limited by fatigue Patient left: in chair;with call bell/phone within reach Nurse Communication: Mobility status PT Visit Diagnosis: Unsteadiness on feet (R26.81);Other abnormalities of gait and mobility (R26.89);Muscle weakness (generalized) (M62.81)    Time: 6606-3016 PT Time Calculation (min) (ACUTE ONLY): 27 min   Charges:   PT Evaluation $PT Eval Moderate Complexity: 1 Mod PT Treatments $Gait Training: 23-37 mins        2:01 PM, 11/21/19 Lonell Grandchild, MPT Physical Therapist with [facility] [phone] office [phone] mobile phone

## 2019-11-21 NOTE — Progress Notes (Signed)
   11/21/19 1420  Assess: MEWS Score  Temp 97.7 F (36.5 C)  BP 119/76  Pulse Rate (!) 118  Resp 20  SpO2 99 %  O2 Device Room Air  Assess: MEWS Score  MEWS Temp 0  MEWS Systolic 0  MEWS Pulse 2  MEWS RR 0  MEWS LOC 0  MEWS Score 2  MEWS Score Color Yellow  Assess: if the MEWS score is Yellow or Red  Were vital signs taken at a resting state? No  Focused Assessment No change from prior assessment  Early Detection of Sepsis Score *See Row Information* Low  MEWS guidelines implemented *See Row Information* No, other (Comment) (Patient had just ambulated)

## 2019-11-21 NOTE — Telephone Encounter (Signed)
Patient has a follow up appointment with Dr. Amedeo [name]. The patient can change the appointment if needed. No other concerns.

## 2019-11-21 NOTE — Plan of Care (Signed)
  Problem: Acute Rehab PT Goals(only PT should resolve) Goal: Pt Will Go Supine/Side To Sit Outcome: Progressing Flowsheets (Taken 11/21/2019 1404) Pt will go Supine/Side to Sit:  Independently  with modified independence Goal: Patient Will Transfer Sit To/From Stand Outcome: Progressing Flowsheets (Taken 11/21/2019 1404) Patient will transfer sit to/from stand: with supervision Goal: Pt Will Transfer Bed To Chair/Chair To Bed Outcome: Progressing Flowsheets (Taken 11/21/2019 1404) Pt will Transfer Bed to Chair/Chair to Bed: with supervision Goal: Pt Will Ambulate Outcome: Progressing Flowsheets (Taken 11/21/2019 1404) Pt will Ambulate:  with supervision  with rolling walker  75 feet Note: NWB RLE   2:05 PM, 11/21/19 Lonell Grandchild, MPT Physical Therapist with [facility] [phone] office [phone] mobile phone

## 2019-11-21 NOTE — TOC Progression Note (Addendum)
Transition of Care [facility]) - Progression Note    Patient Details  Name: ERIQUE KASER MRN: [id number] Date of Birth: 1945-11-10  [facility]) CM/SW Contact  Boneta Lucks, RN Phone Number: 11/21/2019, 10:51 AM  Clinical Narrative:   PT is recommending SNF. Son is agreeable, FL2 completed and sent out for bed offers. TOC to discuss bed offer with son. He is on his way to visit patient to ensure he does not refuse SNF.  ADDENDUM:  Review bed offers with son, He called his sister and they are wanting [facility].  TOC started INS AUTH, patient has not had vaccines. MD/RN and  Ebony Hail updated   Expected Discharge Plan: Skilled Nursing Facility Barriers to Discharge: Continued Medical Work up  Expected Discharge Plan and Services Expected Discharge Plan: [place] In-house Referral: Clinical Social Work Discharge Planning Services: NA Post Acute Care Choice: [place] Living arrangements for the past 2 months: [facility]

## 2019-11-21 NOTE — Evaluation (Signed)
Occupational Therapy Evaluation Patient Details Name: Lee Wang MRN: [id number] DOB: 1946-02-06 Today's Date: 11/21/2019    History of Present Illness LUQMAN PERRELLI is a 74 y.o. male with past medical history as outlined below, who sustained a mechanical fall prior to presentation to the emergency department.  He does have remote history of an ankle fracture, requiring surgery, although he is not sure of how long ago this was.  He noted immediate pain and deformity in his right ankle. Pt is s/p Closed reduction and splinting of bimalleolar right ankle fracture on 11/20/19   Clinical Impression   Pt seen as co-evaluation with PT. Pt reports minimal pain this am. Pt performing seated ADLs with set-up, unable to perform ADLs in standing due to decreased balance with RLE NWB status. Educated on NWB, pt verbalized understanding. Pt performing functional mobility with verbal cuing for RW use, pt requiring occasional rest breaks due to fatigue. Pt declining SNF, recommend [facility] OT services to assess pt functioning in home environment and educate as needed on ADL completion. No further acute OT services required at this time.     Follow Up Recommendations  Home health OT    Equipment Recommendations  3 in 1 bedside commode;Tub/shower seat       Precautions / Restrictions Precautions Precautions: Fall Restrictions Weight Bearing Restrictions: Yes RLE Weight Bearing: Non weight bearing      Mobility Bed Mobility Overal bed mobility: Modified Independent             General bed mobility comments: increased time  Transfers                 General transfer comment: Defer to PT note        ADL either performed or assessed with clinical judgement   ADL Overall ADL's : Needs assistance/impaired Eating/Feeding: Modified independent;Sitting   Grooming: Set up;Sitting Grooming Details (indicate cue type and reason): pt performing tasks in sitting as he is unable to  maintain standing with NWB and perform tasks             Lower Body Dressing: Modified independent;Sitting/lateral leans Lower Body Dressing Details (indicate cue type and reason): donning shoe without difficulty while seated at EOB Toilet Transfer: Min Fish farm manager Details (indicate cue type and reason): cuing for RW use         Functional mobility during ADLs: Min guard;Minimal assistance;Rolling walker       Vision Baseline Vision/History: No visual deficits Patient Visual Report: No change from baseline Vision Assessment?: No apparent visual deficits            Pertinent Vitals/Pain Pain Assessment: Faces Faces Pain Scale: Hurts a little bit Pain Location: right ankle Pain Descriptors / Indicators: Discomfort;Grimacing Pain Intervention(s): Limited activity within patient's tolerance;Monitored during session;Repositioned     Hand Dominance Right   Extremity/Trunk Assessment Upper Extremity Assessment Upper Extremity Assessment: Overall WFL for tasks assessed   Lower Extremity Assessment Lower Extremity Assessment: Defer to PT evaluation   Cervical / Trunk Assessment Cervical / Trunk Assessment: Normal   Communication Communication Communication: No difficulties   Cognition Arousal/Alertness: Awake/alert Behavior During Therapy: WFL for tasks assessed/performed Overall Cognitive Status: Within Functional Limits for tasks assessed  Home Living Family/patient expects to be discharged to:: Private residence Living Arrangements: Alone Available Help at Discharge: Family;Available PRN/intermittently Type of Home: Mobile home Home Access: Stairs to enter Entrance Stairs-Number of Steps: 3 Entrance Stairs-Rails: None Home Layout: One level     Bathroom Shower/Tub: Teacher, early years/pre: Standard     Home Equipment: None          Prior  Functioning/Environment Level of Independence: Independent                 OT Problem List: Decreased activity tolerance;Impaired balance (sitting and/or standing);Decreased safety awareness;Decreased knowledge of use of DME or AE;Pain                       Co-evaluation PT/OT/SLP Co-Evaluation/Treatment: Yes Reason for Co-Treatment: Complexity of the patient's impairments (multi-system involvement);To address functional/ADL transfers   OT goals addressed during session: ADL's and self-care;Proper use of Adaptive equipment and DME         End of Session Equipment Utilized During Treatment: Rolling walker  Activity Tolerance: Patient tolerated treatment well Patient left: in chair;with call bell/phone within reach  OT Visit Diagnosis: Muscle weakness (generalized) (M62.81);Pain Pain - Right/Left: Right Pain - part of body: Ankle and joints of foot                Time: 0802-0825 OT Time Calculation (min): 23 min Charges:  OT General Charges $OT Visit: 1 Visit OT Evaluation $OT Eval Low Complexity: [place], OTR/L  [phone] 11/21/2019, 9:12 AM

## 2019-11-21 NOTE — Progress Notes (Signed)
   ORTHOPAEDIC PROGRESS NOTE  s/p Procedure(s): CLOSED REDUCTION R ANKLE DOS: 11/20/19  SUBJECTIVE: No issues over night.  Pain is well controlled as long as he is not moving the leg.  Occasional sharp pain over the lateral ankle.   OBJECTIVE: PE:  Vitals:   11/20/19 2119 11/21/19 0454  BP: (!) 147/75 (!) 146/83  Pulse: 83 (!) 104  Resp:  17  Temp: 98.4 F (36.9 C) 99.3 F (37.4 C)  SpO2: 98% 99%   Awake and alert.  Responds to questions appropriately.  Some answers do not make sense.  Sensation intact to exposed toes Wiggles exposed toes Splint is clean, dry and intact     ASSESSMENT: Lee Wang is a 74 y.o. male doing well postoperatively.  PLAN: Weightbearing: NWB RLE  PT/OT Insicional and dressing care: Dressings left intact until follow-up Orthopedic device(s): Splint; keep clean and dry Showering: None VTE prophylaxis: discretion of primary team; recommend aspirin 81 mg x 6 weeks Pain control: Multimodal; elevate leg to assist with pain control and swelling Follow - up plan: 1 week  Ok to DC from my perspective; will schedule follow up appointment to discuss definitive fixation.   Plan to schedule follow up appointment for October 20   Ruford Dudzinski A. Amedeo [name], MD [place] [address] [place],  [place]  [address] Phone: [phone] Fax: [phone]

## 2019-11-21 NOTE — Telephone Encounter (Signed)
Can you please call and make a follow up on 11/29/2019 for this patient. Please and thanks.

## 2019-11-21 NOTE — Progress Notes (Signed)
Updated Plan of Care  Mr. Glasheen sustained a right ankle fracture that will require surgery.  His right ankle was reduced under general anesthesia on 11/20/19 due to significant swelling and the presence of fracture blisters.  He was admitted postop due to additional injuries and concerns for ongoing care at home.  I will continue to follow his progress while he is admitted to [facility].  I will see him in clinic on 11/29/19 to evaluate the swelling in his ankle.  I anticipate that the swelling will be significantly improved and we will be able to proceed with definitive fixation at that time.  Current plan for surgery is 11/30/19.  This case has been posted, but has not yet been scheduled.   Jewelz Ricklefs A. Amedeo [name], MD [place] [address] [place],  [place]  [address] Phone: [phone] Fax: [phone]

## 2019-11-21 NOTE — Progress Notes (Addendum)
PROGRESS NOTE  Lee Wang YQM:[id number] DOB: 01-30-1946 DOA: 11/19/2019 PCP: Dione Housekeeper, MD   Brief History:  74 year old male with a history of hypertension, anxiety, hyperlipidemia, and alcohol dependence presenting after a mechanical fall that was witnessed by his son.  Apparently, the patient was going upstairs and tripped falling onto his right side.  He injured his right ankle and had difficulty bearing weight.  As result, the patient was brought to the hospital for further evaluation.  He states that he had been in his usual state of health prior to the mechanical fall.  He denied loss of consciousness.  He denied any fevers, chills, chest pain, shortness breath.  He has a chronic cough.  He denied any nausea, vomiting, abdominal pain.  He has occasional loose stools without hematochezia or melena. Notably, the patient states that he has not seen a primary care physician for about 5 years.  When asked about his medications, he states that the only medication that he takes is some type of blood pressure medicine, but he is unsure how he is getting this refilled. In the emergency department, the patient was afebrile hemodynamically stable with oxygen saturation 97% room air.  BMP showed a sodium 126, potassium 4.3, bicarb 23, serum creatinine 0.56 peer WBC 8.1, hemoglobin 13.9, platelets 213,000.,  1121 right ankle x-ray showed acute displaced trimalleolar fracture of the right ankle with disruption of the ankle mortise.  There was medial displacement of the talus.  Previous screw fixation was bent at the distal  fibula.  Orthopedics was consulted and plans to take the patient to surgery on 11/20/2019.  Assessment/Plan: Right ankle fracture -11/20/2019 ankle x-ray as discussed above -Orthopedic consult appreciated -11/20/2019--closed reduction; splinted right ankle -too much edema for definitive fixation -planning fixation on 11/30/19 if edema is  better.  Hyponatremia -Secondary to poor solute intake and volume depletion -Improved with IV normal saline -Personally reviewed EKG--sinus rhythm, early repolarization changes  Alcohol dependence -Alcohol withdrawal protocol -No signs of withdrawal at this time  Essential hypertension -Previously taking propranolol -Unclear how the patient is receiving refills as he has not seen a primary care provider for 5 years -restart propranolol due to rebound tachycardia  Tobacco abuse -Patient presumptively has COPD -60-pack-year history -No desire to stop smoking presently -Personally reviewed chest x-ray--no consolidation or pulmonary edema -nicoderm patch  Hyperlipidemia -Previously on Lipitor -Check lipid panel-->LDL 89  Anxiety -Previously taking Lexapro and Xanax -Has not taken these medications for a number of years now  Left Rib fractures -stable on RA  Anxiety -PMP Aware reviewed--has not had xanax filled >2 years   Status is: Inpatient  Remains inpatient appropriate because:Ongoing diagnostic testing needed not appropriate for outpatient work up   Dispo: The patient is from: Home  Anticipated d/c is to: SNF  Anticipated d/c date is: 10/13  Patient currently is not medically stable to d/c.        Family Communication: no  Family at bedside  Consultants:  Ortho--[place]  Code Status:  FULL  DVT Prophylaxis:  [place] Lovenox   Procedures: As Listed in Progress Note Above  Antibiotics: None     Subjective: Patient denies fevers, chills, headache, chest pain, dyspnea, nausea, vomiting, diarrhea, abdominal pain, dysuria, hematuria, hematochezia, and melena.   Objective: Vitals:   11/20/19 1751 11/20/19 2119 11/21/19 0454 11/21/19 1420  BP: 108/65 (!) 147/75 (!) 146/83 119/76  Pulse: 90 83 (!) 104 (!) 118  Resp:  16  17 20   Temp: 98.3 F (36.8 C) 98.4 F (36.9 C) 99.3 F (37.4 C)  97.7 F (36.5 C)  TempSrc:  Oral  Oral  SpO2:  98% 99% 99%  Weight: 63.1 kg     Height: 5\' 8"  (1.727 m)       Intake/Output Summary (Last 24 hours) at 11/21/2019 1801 Last data filed at 11/21/2019 0500 Gross per 24 hour  Intake --  Output 1300 ml  Net -1300 ml   Weight change:  Exam:   General:  Pt is alert, follows commands appropriately, not in acute distress  HEENT: No icterus, No thrush, No neck mass, [place]/AT  Cardiovascular: RRR, S1/S2, no rubs, no gallops  Respiratory: diminished BS.  No wheeze.  Bibasilar rales  Abdomen: Soft/+BS, non tender, non distended, no guarding  Extremities: No edema, No lymphangitis, No petechiae, No rashes, no synovitis;  Right foot in splint   Data Reviewed: I have personally reviewed following labs and imaging studies Basic Metabolic Panel: Recent Labs  Lab 11/19/19 2254 11/20/19 0531 11/21/19 0627  NA 126* 134* 133*  K 4.3 3.9 3.6  CL 93* 101 98  CO2 23 22 24   GLUCOSE 96 74 86  BUN 6* 5* 6*  CREATININE 0.56* 0.46* 0.51*  CALCIUM 8.4* 8.7* 8.8*  MG  --  1.7 1.7  PHOS  --  3.4  --    Liver Function Tests: Recent Labs  Lab 11/20/19 0531  AST 37  ALT 24  ALKPHOS 72  BILITOT 1.0  PROT 6.2*  ALBUMIN 3.6   No results for input(s): LIPASE, AMYLASE in the last 168 hours. No results for input(s): AMMONIA in the last 168 hours. Coagulation Profile: Recent Labs  Lab 11/20/19 0531  INR 1.1   CBC: Recent Labs  Lab 11/19/19 2254 11/20/19 0531 11/21/19 0627  WBC 8.1 8.9 6.7  NEUTROABS 6.0  --   --   HGB 13.8 13.6 12.3*  HCT 39.2 39.0 34.4*  MCV 98.0 96.8 96.4  PLT 213 202 157   Cardiac Enzymes: No results for input(s): CKTOTAL, CKMB, CKMBINDEX, TROPONINI in the last 168 hours. BNP: Invalid input(s): POCBNP CBG: No results for input(s): GLUCAP in the last 168 hours. HbA1C: No results for input(s): HGBA1C in the last 72 hours. Urine analysis:    Component Value Date/Time   BILIRUBINUR neg 09/28/2014 1806    PROTEINUR neg 09/28/2014 1806   UROBILINOGEN negative 09/28/2014 1806   NITRITE neg 09/28/2014 1806   LEUKOCYTESUR Negative 09/28/2014 1806   Sepsis Labs: @LABRCNTIP (procalcitonin:4,lacticidven:4) ) Recent Results (from the past 240 hour(s))  Respiratory Panel by RT PCR (Flu A&B, Covid) - Nasopharyngeal Swab     Status: None   Collection Time: 11/19/19 10:55 PM   Specimen: Nasopharyngeal Swab  Result Value Ref Range Status   SARS Coronavirus 2 by RT PCR NEGATIVE NEGATIVE Final    Comment: (NOTE) SARS-CoV-2 target nucleic acids are NOT DETECTED.  The SARS-CoV-2 RNA is generally detectable in upper respiratoy specimens during the acute phase of infection. The lowest concentration of SARS-CoV-2 viral copies this assay can detect is 131 copies/mL. A negative result does not preclude SARS-Cov-2 infection and should not be used as the sole basis for treatment or other patient management decisions. A negative result may occur with  improper specimen collection/handling, submission of specimen other than nasopharyngeal swab, presence of viral mutation(s) within the areas targeted by this assay, and inadequate number of viral copies (<131 copies/mL). A negative result must be combined  with clinical observations, patient history, and epidemiological information. The expected result is Negative.  Fact Sheet for Patients:  PinkCheek.be  Fact Sheet for Healthcare Providers:  GravelBags.it  This test is no t yet approved or cleared by the [place] FDA and  has been authorized for detection and/or diagnosis of SARS-CoV-2 by FDA under an Emergency Use Authorization (EUA). This EUA will remain  in effect (meaning this test can be used) for the duration of the COVID-19 declaration under Section 564(b)(1) of the Act, 21 U.S.C. section 360bbb-3(b)(1), unless the authorization is terminated or revoked sooner.     Influenza A by PCR  NEGATIVE NEGATIVE Final   Influenza B by PCR NEGATIVE NEGATIVE Final    Comment: (NOTE) The Xpert Xpress SARS-CoV-2/FLU/RSV assay is intended as an aid in  the diagnosis of influenza from Nasopharyngeal swab specimens and  should not be used as a sole basis for treatment. Nasal washings and  aspirates are unacceptable for Xpert Xpress SARS-CoV-2/FLU/RSV  testing.  Fact Sheet for Patients: PinkCheek.be  Fact Sheet for Healthcare Providers: GravelBags.it  This test is not yet approved or cleared by the [place] FDA and  has been authorized for detection and/or diagnosis of SARS-CoV-2 by  FDA under an Emergency Use Authorization (EUA). This EUA will remain  in effect (meaning this test can be used) for the duration of the  Covid-19 declaration under Section 564(b)(1) of the Act, 21  U.S.C. section 360bbb-3(b)(1), unless the authorization is  terminated or revoked. Performed at [facility], [address], [place], [place] [address]      Scheduled Meds: . folic acid  1 mg Oral Daily  . multivitamin with minerals  1 tablet Oral Daily  . nicotine  21 mg Transdermal Daily  . thiamine  100 mg Oral Daily   Or  . thiamine  100 mg Intravenous Daily   Continuous Infusions: . 0.9 % NaCl with KCl 20 mEq / L    . magnesium sulfate bolus IVPB    . sodium chloride      Procedures/Studies: DG Ankle 2 Views Right  Result Date: 11/20/2019 CLINICAL DATA:  74 year old male with comminuted ankle fracture. Intraoperative images. EXAM: DG C-ARM 1-60 MIN; RIGHT ANKLE - 2 VIEW FLUOROSCOPY TIME:  Fluoroscopy Time:  0 minutes 2nd Radiation Exposure Index (if provided by the fluoroscopic device): Number of Acquired Spot Images: 0 COMPARISON:  Right ankle series 11/19/2019. FINDINGS: Five intraoperative fluoroscopic spot views of the right ankle demonstrate reduced comminuted fracture dislocation with near anatomic alignment of the medial  malleolus fragment. A fragment of the lateral malleolus superimposed on prior distal fibula ORIF is less well visualized fluoroscopically. The hardware appears stable. IMPRESSION: Improved, near anatomic alignment of right ankle fracture dislocation. Previous distal fibula ORIF. Electronically Signed   By: Genevie Ann M.D.   On: 11/20/2019 19:19   DG Ankle Complete Right  Result Date: 11/19/2019 CLINICAL DATA:  Pain EXAM: RIGHT ANKLE - COMPLETE 3+ VIEW COMPARISON:  None. FINDINGS: There is an acute displaced trimalleolar fracture with disruption of the ankle mortise. There is significant medial displacement of the talus with respect to the distal tibia. There is a large cyst displaced fracture fragment measuring 4.5 cm involving the medial malleolus. There may be a subtle impaction fracture involving the lateral articular surface of the talus. There is extensive surrounding soft tissue swelling. The patient's prior plate and screw fixation of the distal fibula is bent. There is an acute fracture through the base of  the fifth metatarsal. The posterior calcaneus is suboptimally evaluated secondary to overlapping objects external to the patient. IMPRESSION: 1. Fracture dislocation of the ankle as detailed above. 2. Acute fracture through the base of the fifth metatarsal is favored to represent an avulsion fracture, however dedicated foot radiographs are recommended. Electronically Signed   By: Constance Holster M.D.   On: 11/19/2019 22:20   DG Chest Port 1 View  Result Date: 11/19/2019 CLINICAL DATA:  Fall EXAM: PORTABLE CHEST 1 VIEW COMPARISON:  None. FINDINGS: Lungs are clear. No pneumothorax or pleural effusion. Cardiac size within normal limits. Pulmonary vascularity is normal. There are probable acute fractures of the left 6, 7, and 8 ribs posterolaterally demonstrating minimal displacement. IMPRESSION: Acute left rib fractures.  No pneumothorax. Electronically Signed   By: Fidela Salisbury MD   On:  11/19/2019 23:20   DG Ankle Right Port  Result Date: 11/20/2019 CLINICAL DATA:  74 year old male with comminuted ankle fracture. Status post closed reduction and splinting of bimalleolar right ankle fracture. EXAM: PORTABLE RIGHT ANKLE - 2 VIEW COMPARISON:  Intraoperative images today. Right ankle series 11/19/2019. FINDINGS: Previous distal right fibula ORIF. Splint or cast material now in place. Substantially improved alignment of right ankle fracture dislocation compared to yesterday. On these images there is residual mild displacement and distraction of the medial malleolus fracture. No new osseous injury identified. IMPRESSION: Closed reduction of comminuted right ankle fracture dislocation. Prior distal fibula ORIF. Electronically Signed   By: Genevie Ann M.D.   On: 11/20/2019 19:21   DG C-Arm 1-60 Min  Result Date: 11/20/2019 CLINICAL DATA:  74 year old male with comminuted ankle fracture. Intraoperative images. EXAM: DG C-ARM 1-60 MIN; RIGHT ANKLE - 2 VIEW FLUOROSCOPY TIME:  Fluoroscopy Time:  0 minutes 2nd Radiation Exposure Index (if provided by the fluoroscopic device): Number of Acquired Spot Images: 0 COMPARISON:  Right ankle series 11/19/2019. FINDINGS: Five intraoperative fluoroscopic spot views of the right ankle demonstrate reduced comminuted fracture dislocation with near anatomic alignment of the medial malleolus fragment. A fragment of the lateral malleolus superimposed on prior distal fibula ORIF is less well visualized fluoroscopically. The hardware appears stable. IMPRESSION: Improved, near anatomic alignment of right ankle fracture dislocation. Previous distal fibula ORIF. Electronically Signed   By: Genevie Ann M.D.   On: 11/20/2019 19:19    Orson Eva, DO  [facility]  If 7PM-7AM, please contact night-coverage [email or web address] Password [email or web address] 11/21/2019, 6:01 PM   LOS: 1 day

## 2019-11-21 NOTE — NC FL2 (Signed)
[place] LEVEL OF CARE SCREENING TOOL     IDENTIFICATION  Patient Name: Lee Wang Birthdate: 1946/01/06 Sex: male Admission Date (Current Location): 11/19/2019  [facility] and [place] Number:  [employer] and Address:  [facility] [address], [place]      Provider Number: [id number]  Attending Physician Name and Address:  Orson Eva, MD  Relative Name and Phone Number:  ryshawn sanzone  - daughter [phone]    Current Level of Care: Hospital Recommended Level of Care: [place] Prior Approval Number:    Date Approved/Denied:   PASRR Number: [id number]  Discharge Plan: SNF    Current Diagnoses: Patient Active Problem List   Diagnosis Date Noted  . Avulsion fracture of metatarsal bone of right foot with delayed healing 11/20/2019  . Left rib fracture 11/20/2019  . Hyponatremia 11/20/2019  . Alcohol dependence ([place]) 11/20/2019  . Closed right ankle fracture 11/20/2019  . Tobacco abuse 11/20/2019  . Closed trimalleolar fracture of right ankle   . Greater tuberosity of humerus fracture 10/14/2016  . Closed fracture dislocation of joint of left shoulder girdle 10/08/2016  . GAD (generalized anxiety disorder) 03/09/2015  . Elevated LFTs 11/05/2014  . Abnormal [place] (ultrasound) of abdomen 11/05/2014  . Hepatomegaly 11/05/2014  . History of colonic polyps 11/05/2014  . HTN (hypertension) 05/28/2010  . Hyperlipemia 05/28/2010  . Depression with anxiety 05/28/2010  . Prostate CA ([place]) 05/28/2010  . BPH (benign prostatic hyperplasia) 05/28/2010  . Alcohol abuse 07/18/2009  . HEMATOCHEZIA 07/18/2009  . DIARRHEA 07/18/2009  . ABDOMINAL PAIN, UNSPECIFIED SITE 07/18/2009    Orientation RESPIRATION BLADDER Height & Weight     Self, Situation  Normal Continent Weight: 63.1 kg Height:  5\' 8"  (172.7 cm)  BEHAVIORAL SYMPTOMS/MOOD NEUROLOGICAL BOWEL NUTRITION STATUS      Continent Diet (See DC summary)   AMBULATORY STATUS COMMUNICATION OF NEEDS Skin   Extensive Assist Verbally Surgical wounds (Right foot)                       Personal Care Assistance Level of Assistance  Bathing, Dressing, Feeding Bathing Assistance: Limited assistance Feeding assistance: Independent Dressing Assistance: Limited assistance     Functional Limitations Info  Sight, Hearing, Speech Sight Info: Adequate Hearing Info: Adequate Speech Info: Adequate    SPECIAL CARE FACTORS FREQUENCY  PT (By licensed PT)     PT Frequency: 5 times a week              Contractures Contractures Info: Not present    Additional Factors Info  Code Status, Allergies Code Status Info: FULL Allergies Info: NKDA           Current Medications (11/21/2019):  This is the current hospital active medication list Current Facility-Administered Medications  Medication Dose Route Frequency Provider Last Rate Last Admin  . folic acid (FOLVITE) tablet 1 mg  1 mg Oral Daily Larena Glassman A, MD      . LORazepam (ATIVAN) tablet 1-4 mg  1-4 mg Oral Q1H PRN Mordecai Rasmussen, MD       Or  . LORazepam (ATIVAN) injection 1-4 mg  1-4 mg Intravenous Q1H PRN Mordecai Rasmussen, MD   1 mg at 11/20/19 1709  . morphine 2 MG/ML injection 2 mg  2 mg Intravenous Q4H PRN Mordecai Rasmussen, MD      . multivitamin with minerals tablet 1 tablet  1 tablet Oral Daily Amedeo [name],  Jeannette How, MD      . nicotine (NICODERM CQ - dosed in mg/24 hours) patch 21 mg  21 mg Transdermal Daily Lang Snow, FNP   21 mg at 11/20/19 2303  . thiamine tablet 100 mg  100 mg Oral Daily Mordecai Rasmussen, MD       Or  . thiamine (B-1) injection 100 mg  100 mg Intravenous Daily Mordecai Rasmussen, MD         Discharge Medications: Please see discharge summary for a list of discharge medications.  Relevant Imaging Results:  Relevant Lab Results:   Additional Information SS # [id number]  Boneta Lucks, RN

## 2019-11-22 DIAGNOSIS — S92301G Fracture of unspecified metatarsal bone(s), right foot, subsequent encounter for fracture with delayed healing: Secondary | ICD-10-CM | POA: Diagnosis not present

## 2019-11-22 DIAGNOSIS — S82391A Other fracture of lower end of right tibia, initial encounter for closed fracture: Secondary | ICD-10-CM | POA: Diagnosis not present

## 2019-11-22 DIAGNOSIS — F411 Generalized anxiety disorder: Secondary | ICD-10-CM

## 2019-11-22 DIAGNOSIS — Z720 Tobacco use: Secondary | ICD-10-CM | POA: Diagnosis not present

## 2019-11-22 DIAGNOSIS — I10 Essential (primary) hypertension: Secondary | ICD-10-CM | POA: Diagnosis not present

## 2019-11-22 DIAGNOSIS — S82851D Displaced trimalleolar fracture of right lower leg, subsequent encounter for closed fracture with routine healing: Secondary | ICD-10-CM | POA: Diagnosis not present

## 2019-11-22 DIAGNOSIS — M6281 Muscle weakness (generalized): Secondary | ICD-10-CM | POA: Diagnosis not present

## 2019-11-22 DIAGNOSIS — S82891A Other fracture of right lower leg, initial encounter for closed fracture: Secondary | ICD-10-CM | POA: Diagnosis not present

## 2019-11-22 DIAGNOSIS — S82841A Displaced bimalleolar fracture of right lower leg, initial encounter for closed fracture: Secondary | ICD-10-CM | POA: Diagnosis not present

## 2019-11-22 DIAGNOSIS — E785 Hyperlipidemia, unspecified: Secondary | ICD-10-CM | POA: Diagnosis not present

## 2019-11-22 DIAGNOSIS — W19XXXA Unspecified fall, initial encounter: Secondary | ICD-10-CM | POA: Diagnosis not present

## 2019-11-22 DIAGNOSIS — R262 Difficulty in walking, not elsewhere classified: Secondary | ICD-10-CM | POA: Diagnosis not present

## 2019-11-22 DIAGNOSIS — S82853A Displaced trimalleolar fracture of unspecified lower leg, initial encounter for closed fracture: Secondary | ICD-10-CM | POA: Diagnosis not present

## 2019-11-22 DIAGNOSIS — S82831A Other fracture of upper and lower end of right fibula, initial encounter for closed fracture: Secondary | ICD-10-CM | POA: Diagnosis not present

## 2019-11-22 DIAGNOSIS — G8918 Other acute postprocedural pain: Secondary | ICD-10-CM | POA: Diagnosis not present

## 2019-11-22 DIAGNOSIS — E46 Unspecified protein-calorie malnutrition: Secondary | ICD-10-CM | POA: Diagnosis not present

## 2019-11-22 DIAGNOSIS — Z043 Encounter for examination and observation following other accident: Secondary | ICD-10-CM | POA: Diagnosis not present

## 2019-11-22 DIAGNOSIS — F1020 Alcohol dependence, uncomplicated: Secondary | ICD-10-CM | POA: Diagnosis not present

## 2019-11-22 DIAGNOSIS — S82891D Other fracture of right lower leg, subsequent encounter for closed fracture with routine healing: Secondary | ICD-10-CM | POA: Diagnosis not present

## 2019-11-22 DIAGNOSIS — S82391D Other fracture of lower end of right tibia, subsequent encounter for closed fracture with routine healing: Secondary | ICD-10-CM | POA: Diagnosis not present

## 2019-11-22 DIAGNOSIS — S2232XD Fracture of one rib, left side, subsequent encounter for fracture with routine healing: Secondary | ICD-10-CM | POA: Diagnosis not present

## 2019-11-22 DIAGNOSIS — F17210 Nicotine dependence, cigarettes, uncomplicated: Secondary | ICD-10-CM | POA: Diagnosis not present

## 2019-11-22 DIAGNOSIS — S82831D Other fracture of upper and lower end of right fibula, subsequent encounter for closed fracture with routine healing: Secondary | ICD-10-CM | POA: Diagnosis not present

## 2019-11-22 DIAGNOSIS — E782 Mixed hyperlipidemia: Secondary | ICD-10-CM | POA: Diagnosis not present

## 2019-11-22 DIAGNOSIS — F17200 Nicotine dependence, unspecified, uncomplicated: Secondary | ICD-10-CM | POA: Diagnosis not present

## 2019-11-22 DIAGNOSIS — E871 Hypo-osmolality and hyponatremia: Secondary | ICD-10-CM | POA: Diagnosis not present

## 2019-11-22 DIAGNOSIS — Z743 Need for continuous supervision: Secondary | ICD-10-CM | POA: Diagnosis not present

## 2019-11-22 DIAGNOSIS — S2239XA Fracture of one rib, unspecified side, initial encounter for closed fracture: Secondary | ICD-10-CM | POA: Diagnosis not present

## 2019-11-22 DIAGNOSIS — S82844A Nondisplaced bimalleolar fracture of right lower leg, initial encounter for closed fracture: Secondary | ICD-10-CM | POA: Diagnosis not present

## 2019-11-22 DIAGNOSIS — Z20822 Contact with and (suspected) exposure to covid-19: Secondary | ICD-10-CM | POA: Diagnosis not present

## 2019-11-22 DIAGNOSIS — R404 Transient alteration of awareness: Secondary | ICD-10-CM | POA: Diagnosis not present

## 2019-11-22 LAB — CBC
HCT: 32.3 % — ABNORMAL LOW (ref 39.0–52.0)
Hemoglobin: 11.4 g/dL — ABNORMAL LOW (ref 13.0–17.0)
MCH: 34.5 pg — ABNORMAL HIGH (ref 26.0–34.0)
MCHC: 35.3 g/dL (ref 30.0–36.0)
MCV: 97.9 fL (ref 80.0–100.0)
Platelets: 160 10*3/uL (ref 150–400)
RBC: 3.3 MIL/uL — ABNORMAL LOW (ref 4.22–5.81)
RDW: 12.3 % (ref 11.5–15.5)
WBC: 7.7 10*3/uL (ref 4.0–10.5)
nRBC: 0 % (ref 0.0–0.2)

## 2019-11-22 LAB — BASIC METABOLIC PANEL
Anion gap: 9 (ref 5–15)
BUN: 6 mg/dL — ABNORMAL LOW (ref 8–23)
CO2: 24 mmol/L (ref 22–32)
Calcium: 9.3 mg/dL (ref 8.9–10.3)
Chloride: 103 mmol/L (ref 98–111)
Creatinine, Ser: 0.55 mg/dL — ABNORMAL LOW (ref 0.61–1.24)
GFR, Estimated: >60 mL/min (ref >60)
Glucose, Bld: 92 mg/dL (ref 70–99)
Potassium: 3.8 mmol/L (ref 3.5–5.1)
Sodium: 136 mmol/L (ref 135–145)

## 2019-11-22 LAB — SARS CORONAVIRUS 2 BY RT PCR (HOSPITAL ORDER, PERFORMED IN ~~LOC~~ HOSPITAL LAB): SARS Coronavirus 2: NEGATIVE

## 2019-11-22 LAB — MAGNESIUM: Magnesium: 1.9 mg/dL (ref 1.7–2.4)

## 2019-11-22 MED ORDER — PROPRANOLOL HCL 20 MG PO TABS: 10.0000 mg | ORAL_TABLET | Freq: Three times a day (TID) | ORAL | 2 refills | Status: DC

## 2019-11-22 MED ORDER — ALPRAZOLAM 0.5 MG PO TABS: 0.5000 mg | ORAL_TABLET | Freq: Two times a day (BID) | ORAL | 2 refills | Status: DC | PRN

## 2019-11-22 MED ORDER — OXYCODONE-ACETAMINOPHEN 5-325 MG PO TABS
1.0000 | ORAL_TABLET | ORAL | 0 refills | Status: DC | PRN
Start: 2019-11-22 — End: 2019-12-02

## 2019-11-22 MED ORDER — ASPIRIN EC 81 MG PO TBEC: 81.0000 mg | DELAYED_RELEASE_TABLET | Freq: Two times a day (BID) | ORAL | 2 refills | Status: DC

## 2019-11-22 NOTE — Discharge Summary (Signed)
Physician Discharge Summary  Lee Wang CXK:[id number] DOB: 08/05/1945 DOA: 11/19/2019  PCP: Dione Housekeeper, MD  Admit date: 11/19/2019 Discharge date: 11/22/2019  Admitted From: Home Disposition: Skilled nursing facility  Recommendations for Outpatient Follow-up:  1. Follow up with PCP in 1-2 weeks 2. Please obtain BMP/CBC in one week 3. Follow-up with orthopedic surgery on 10/20 with tentative plan for surgery on 10/21 4. Patient is nonweightbearing on right lower extremity  Discharge Condition: Stable CODE STATUS: Full code Diet recommendation: Heart healthy  Brief/Interim Summary: 74 year old male with a history of hypertension, anxiety, hyperlipidemia, and alcohol dependence presenting after a mechanical fall that was witnessed by his son. Apparently, the patient was going upstairs and tripped falling onto his right side. He injured his right ankle and had difficulty bearing weight. As result, the patient was brought to the hospital for further evaluation. He states that he had been in his usual state of health prior to the mechanical fall. He denied loss of consciousness. He denied any fevers, chills, chest pain, shortness breath. He has a chronic cough. He denied any nausea, vomiting, abdominal pain. He has occasional loose stools without hematochezia or melena. Notably, the patient states that he has not seen a primary care physician for about 5 years. When asked about his medications, he states that the only medication that he takes is some type of blood pressure medicine, but he is unsure how he is getting this refilled. In the emergency department, the patient was afebrile hemodynamically stable with oxygen saturation 97% room air. BMP showed a sodium 126, potassium 4.3, bicarb 23, serum creatinine 0.56 peer WBC 8.1, hemoglobin 13.9, platelets 213,000., 1121 right ankle x-ray showed acute displaced trimalleolar fracture of the right ankle with disruption of the  ankle mortise. There was medial displacement of the talus. Previous screw fixation was bent at the distal fibula. Orthopedics was consulted   Discharge Diagnoses:  Principal Problem:   Avulsion fracture of metatarsal bone of right foot with delayed healing Active Problems:   Alcohol abuse   HTN (hypertension)   Hyperlipemia   Left rib fracture   Hyponatremia   Alcohol dependence ([place])   Closed right ankle fracture   Tobacco abuse  Right ankle fracture -11/20/2019 ankle x-ray as discussed above -Orthopedic consult appreciated -11/20/2019--closed reduction; splinted right ankle -Currently too much edema for definitive fixation -planning fixation on 11/30/19 if edema is better. -Patient will follow up on 10/20 in the office  Hyponatremia -Secondary to poor solute intake and volume depletion -Improved with IV normal saline  Alcohol dependence -Alcohol withdrawal protocol -No signs of withdrawal at this time  Essential hypertension -Previously taking propranolol -Unclear how the patient is receiving refills as he has not seen a primary care provider for 5 years -restarted propranolol at reduced dose due to rebound tachycardia -Blood pressure currently stable  Tobacco abuse -Patient presumptively has COPD -60-pack-year history -No desire to stop smoking presently -Personally reviewed chest x-ray--no consolidation or pulmonary edema -nicoderm patch  Hyperlipidemia -Previously on Lipitor -Check lipid panel-->LDL 89 -Restarted on Lipitor  Anxiety -Previously taking Lexapro and Xanax -Has not taken these medications for a number of years now -Restarted on discharge  Left Rib fractures -stable on RA  Anxiety -PMP Aware reviewed--has not had xanax filled >2 years  Discharge Instructions  Discharge Instructions    Diet - low sodium heart healthy   Complete by: As directed    Discharge wound care:   Complete by: As directed    Keep right  ankle incision  clean and dry   Increase activity slowly   Complete by: As directed    No wound care   Complete by: As directed      Allergies as of 11/22/2019   No Known Allergies     Medication List    TAKE these medications   ALPRAZolam 0.5 MG tablet Commonly known as: XANAX Take 1 tablet (0.5 mg total) by mouth 2 (two) times daily as needed. for anxiety   aspirin EC 81 MG tablet Take 1 tablet (81 mg total) by mouth in the morning and at bedtime. Swallow whole.   atorvastatin 40 MG tablet Commonly known as: LIPITOR Take 1 tablet (40 mg total) by mouth daily.   escitalopram 10 MG tablet Commonly known as: LEXAPRO Take 1 tablet (10 mg total) by mouth daily.   oxyCODONE-acetaminophen 5-325 MG tablet Commonly known as: Roxicet Take 1 tablet by mouth every 4 (four) hours as needed for severe pain.   propranolol 20 MG tablet Commonly known as: INDERAL Take 0.5 tablets (10 mg total) by mouth 3 (three) times daily. What changed: how much to take            Discharge Care Instructions  (From admission, onward)         Start     Ordered   11/22/19 0000  Discharge wound care:       Comments: Keep right ankle incision clean and dry   11/22/19 1225          Contact information for after-discharge care    [facility] .   Service: Skilled Nursing Contact information: [address] [place] [phone]                 No Known Allergies  Consultations:  Orthopedic surgery, Dr. Amedeo [name]   Procedures/Studies: DG Ankle 2 Views Right  Result Date: 11/20/2019 CLINICAL DATA:  74 year old male with comminuted ankle fracture. Intraoperative images. EXAM: DG C-ARM 1-60 MIN; RIGHT ANKLE - 2 VIEW FLUOROSCOPY TIME:  Fluoroscopy Time:  0 minutes 2nd Radiation Exposure Index (if provided by the fluoroscopic device): Number of Acquired Spot Images: 0 COMPARISON:  Right ankle series 11/19/2019. FINDINGS: Five  intraoperative fluoroscopic spot views of the right ankle demonstrate reduced comminuted fracture dislocation with near anatomic alignment of the medial malleolus fragment. A fragment of the lateral malleolus superimposed on prior distal fibula ORIF is less well visualized fluoroscopically. The hardware appears stable. IMPRESSION: Improved, near anatomic alignment of right ankle fracture dislocation. Previous distal fibula ORIF. Electronically Signed   By: Genevie Ann M.D.   On: 11/20/2019 19:19   DG Ankle Complete Right  Result Date: 11/19/2019 CLINICAL DATA:  Pain EXAM: RIGHT ANKLE - COMPLETE 3+ VIEW COMPARISON:  None. FINDINGS: There is an acute displaced trimalleolar fracture with disruption of the ankle mortise. There is significant medial displacement of the talus with respect to the distal tibia. There is a large cyst displaced fracture fragment measuring 4.5 cm involving the medial malleolus. There may be a subtle impaction fracture involving the lateral articular surface of the talus. There is extensive surrounding soft tissue swelling. The patient's prior plate and screw fixation of the distal fibula is bent. There is an acute fracture through the base of the fifth metatarsal. The posterior calcaneus is suboptimally evaluated secondary to overlapping objects external to the patient. IMPRESSION: 1. Fracture dislocation of the ankle as detailed above. 2. Acute fracture through  the base of the fifth metatarsal is favored to represent an avulsion fracture, however dedicated foot radiographs are recommended. Electronically Signed   By: Constance Holster M.D.   On: 11/19/2019 22:20   DG Chest Port 1 View  Result Date: 11/19/2019 CLINICAL DATA:  Fall EXAM: PORTABLE CHEST 1 VIEW COMPARISON:  None. FINDINGS: Lungs are clear. No pneumothorax or pleural effusion. Cardiac size within normal limits. Pulmonary vascularity is normal. There are probable acute fractures of the left 6, 7, and 8 ribs posterolaterally  demonstrating minimal displacement. IMPRESSION: Acute left rib fractures.  No pneumothorax. Electronically Signed   By: Fidela Salisbury MD   On: 11/19/2019 23:20   DG Ankle Right Port  Result Date: 11/20/2019 CLINICAL DATA:  74 year old male with comminuted ankle fracture. Status post closed reduction and splinting of bimalleolar right ankle fracture. EXAM: PORTABLE RIGHT ANKLE - 2 VIEW COMPARISON:  Intraoperative images today. Right ankle series 11/19/2019. FINDINGS: Previous distal right fibula ORIF. Splint or cast material now in place. Substantially improved alignment of right ankle fracture dislocation compared to yesterday. On these images there is residual mild displacement and distraction of the medial malleolus fracture. No new osseous injury identified. IMPRESSION: Closed reduction of comminuted right ankle fracture dislocation. Prior distal fibula ORIF. Electronically Signed   By: Genevie Ann M.D.   On: 11/20/2019 19:21   DG C-Arm 1-60 Min  Result Date: 11/20/2019 CLINICAL DATA:  74 year old male with comminuted ankle fracture. Intraoperative images. EXAM: DG C-ARM 1-60 MIN; RIGHT ANKLE - 2 VIEW FLUOROSCOPY TIME:  Fluoroscopy Time:  0 minutes 2nd Radiation Exposure Index (if provided by the fluoroscopic device): Number of Acquired Spot Images: 0 COMPARISON:  Right ankle series 11/19/2019. FINDINGS: Five intraoperative fluoroscopic spot views of the right ankle demonstrate reduced comminuted fracture dislocation with near anatomic alignment of the medial malleolus fragment. A fragment of the lateral malleolus superimposed on prior distal fibula ORIF is less well visualized fluoroscopically. The hardware appears stable. IMPRESSION: Improved, near anatomic alignment of right ankle fracture dislocation. Previous distal fibula ORIF. Electronically Signed   By: Genevie Ann M.D.   On: 11/20/2019 19:19       Subjective: Sleeping on my arrival, wakes up to voice.  Denies any significant pain  Discharge  Exam: Vitals:   11/21/19 0454 11/21/19 1420 11/21/19 2158 11/22/19 0600  BP: (!) 146/83 119/76 137/86 131/72  Pulse: (!) 104 (!) 118 (!) 106 88  Resp: 17 20 16 17   Temp: 99.3 F (37.4 C) 97.7 F (36.5 C) 98.8 F (37.1 C) 98.9 F (37.2 C)  TempSrc:  Oral    SpO2: 99% 99% 100% 99%  Weight:      Height:        General: Pt is alert, awake, not in acute distress Cardiovascular: RRR, S1/S2 +, no rubs, no gallops Respiratory: CTA bilaterally, no wheezing, no rhonchi Abdominal: Soft, NT, ND, bowel sounds + Extremities: Right lower extremitie is in brace    The results of significant diagnostics from this hospitalization (including imaging, microbiology, ancillary and laboratory) are listed below for reference.     Microbiology: Recent Results (from the past 240 hour(s))  Respiratory Panel by RT PCR (Flu A&B, Covid) - Nasopharyngeal Swab     Status: None   Collection Time: 11/19/19 10:55 PM   Specimen: Nasopharyngeal Swab  Result Value Ref Range Status   SARS Coronavirus 2 by RT PCR NEGATIVE NEGATIVE Final    Comment: (NOTE) SARS-CoV-2 target nucleic acids are NOT DETECTED.  The  SARS-CoV-2 RNA is generally detectable in upper respiratoy specimens during the acute phase of infection. The lowest concentration of SARS-CoV-2 viral copies this assay can detect is 131 copies/mL. A negative result does not preclude SARS-Cov-2 infection and should not be used as the sole basis for treatment or other patient management decisions. A negative result may occur with  improper specimen collection/handling, submission of specimen other than nasopharyngeal swab, presence of viral mutation(s) within the areas targeted by this assay, and inadequate number of viral copies (<131 copies/mL). A negative result must be combined with clinical observations, patient history, and epidemiological information. The expected result is Negative.  Fact Sheet for Patients:   PinkCheek.be  Fact Sheet for Healthcare Providers:  GravelBags.it  This test is no t yet approved or cleared by the [place] FDA and  has been authorized for detection and/or diagnosis of SARS-CoV-2 by FDA under an Emergency Use Authorization (EUA). This EUA will remain  in effect (meaning this test can be used) for the duration of the COVID-19 declaration under Section 564(b)(1) of the Act, 21 U.S.C. section 360bbb-3(b)(1), unless the authorization is terminated or revoked sooner.     Influenza A by PCR NEGATIVE NEGATIVE Final   Influenza B by PCR NEGATIVE NEGATIVE Final    Comment: (NOTE) The Xpert Xpress SARS-CoV-2/FLU/RSV assay is intended as an aid in  the diagnosis of influenza from Nasopharyngeal swab specimens and  should not be used as a sole basis for treatment. Nasal washings and  aspirates are unacceptable for Xpert Xpress SARS-CoV-2/FLU/RSV  testing.  Fact Sheet for Patients: PinkCheek.be  Fact Sheet for Healthcare Providers: GravelBags.it  This test is not yet approved or cleared by the [place] FDA and  has been authorized for detection and/or diagnosis of SARS-CoV-2 by  FDA under an Emergency Use Authorization (EUA). This EUA will remain  in effect (meaning this test can be used) for the duration of the  Covid-19 declaration under Section 564(b)(1) of the Act, 21  U.S.C. section 360bbb-3(b)(1), unless the authorization is  terminated or revoked. Performed at [facility], [address], [place], [place] [address]   SARS Coronavirus 2 by RT PCR (hospital order, performed in [facility] hospital lab) Nasopharyngeal Nasopharyngeal Swab     Status: None   Collection Time: 11/22/19 10:35 AM   Specimen: Nasopharyngeal Swab  Result Value Ref Range Status   SARS Coronavirus 2 NEGATIVE NEGATIVE Final    Comment: (NOTE) SARS-CoV-2 target  nucleic acids are NOT DETECTED.  The SARS-CoV-2 RNA is generally detectable in upper and lower respiratory specimens during the acute phase of infection. The lowest concentration of SARS-CoV-2 viral copies this assay can detect is 250 copies / mL. A negative result does not preclude SARS-CoV-2 infection and should not be used as the sole basis for treatment or other patient management decisions.  A negative result may occur with improper specimen collection / handling, submission of specimen other than nasopharyngeal swab, presence of viral mutation(s) within the areas targeted by this assay, and inadequate number of viral copies (<250 copies / mL). A negative result must be combined with clinical observations, patient history, and epidemiological information.  Fact Sheet for Patients:   StrictlyIdeas.no  Fact Sheet for Healthcare Providers: BankingDealers.co.za  This test is not yet approved or  cleared by the [place] FDA and has been authorized for detection and/or diagnosis of SARS-CoV-2 by FDA under an Emergency Use Authorization (EUA).  This EUA will remain in effect (meaning this test can  be used) for the duration of the COVID-19 declaration under Section 564(b)(1) of the Act, 21 U.S.C. section 360bbb-3(b)(1), unless the authorization is terminated or revoked sooner.  Performed at [facility], [address] [place], [place] [address]      Labs: BNP (last 3 results) No results for input(s): BNP in the last 8760 hours. Basic Metabolic Panel: Recent Labs  Lab 11/19/19 2254 11/20/19 0531 11/21/19 0627 11/22/19 0644  NA 126* 134* 133* 136  K 4.3 3.9 3.6 3.8  CL 93* 101 98 103  CO2 23 22 24 24   GLUCOSE 96 74 86 92  BUN 6* 5* 6* 6*  CREATININE 0.56* 0.46* 0.51* 0.55*  CALCIUM 8.4* 8.7* 8.8* 9.3  MG  --  1.7 1.7 1.9  PHOS  --  3.4  --   --    Liver Function Tests: Recent Labs  Lab 11/20/19 0531  AST 37  ALT  24  ALKPHOS 72  BILITOT 1.0  PROT 6.2*  ALBUMIN 3.6   No results for input(s): LIPASE, AMYLASE in the last 168 hours. No results for input(s): AMMONIA in the last 168 hours. CBC: Recent Labs  Lab 11/19/19 2254 11/20/19 0531 11/21/19 0627 11/22/19 0644  WBC 8.1 8.9 6.7 7.7  NEUTROABS 6.0  --   --   --   HGB 13.8 13.6 12.3* 11.4*  HCT 39.2 39.0 34.4* 32.3*  MCV 98.0 96.8 96.4 97.9  PLT 213 202 157 160   Cardiac Enzymes: No results for input(s): CKTOTAL, CKMB, CKMBINDEX, TROPONINI in the last 168 hours. BNP: Invalid input(s): POCBNP CBG: No results for input(s): GLUCAP in the last 168 hours. D-Dimer No results for input(s): DDIMER in the last 72 hours. Hgb A1c No results for input(s): HGBA1C in the last 72 hours. Lipid Profile Recent Labs    11/20/19 0531  CHOL 171  HDL 72  LDLCALC 89  TRIG 49  CHOLHDL 2.4   Thyroid function studies No results for input(s): TSH, T4TOTAL, T3FREE, THYROIDAB in the last 72 hours.  Invalid input(s): FREET3 Anemia work up Recent Labs    11/20/19 0531  VITAMINB12 320  FOLATE 10.0   Urinalysis    Component Value Date/Time   BILIRUBINUR neg 09/28/2014 1806   PROTEINUR neg 09/28/2014 1806   UROBILINOGEN negative 09/28/2014 1806   NITRITE neg 09/28/2014 1806   LEUKOCYTESUR Negative 09/28/2014 1806   Sepsis Labs Invalid input(s): PROCALCITONIN,  WBC,  LACTICIDVEN Microbiology Recent Results (from the past 240 hour(s))  Respiratory Panel by RT PCR (Flu A&B, Covid) - Nasopharyngeal Swab     Status: None   Collection Time: 11/19/19 10:55 PM   Specimen: Nasopharyngeal Swab  Result Value Ref Range Status   SARS Coronavirus 2 by RT PCR NEGATIVE NEGATIVE Final    Comment: (NOTE) SARS-CoV-2 target nucleic acids are NOT DETECTED.  The SARS-CoV-2 RNA is generally detectable in upper respiratoy specimens during the acute phase of infection. The lowest concentration of SARS-CoV-2 viral copies this assay can detect is 131 copies/mL.  A negative result does not preclude SARS-Cov-2 infection and should not be used as the sole basis for treatment or other patient management decisions. A negative result may occur with  improper specimen collection/handling, submission of specimen other than nasopharyngeal swab, presence of viral mutation(s) within the areas targeted by this assay, and inadequate number of viral copies (<131 copies/mL). A negative result must be combined with clinical observations, patient history, and epidemiological information. The expected result is Negative.  Fact Sheet for Patients:  PinkCheek.be  Fact Sheet for Healthcare Providers:  GravelBags.it  This test is no t yet approved or cleared by the [place] FDA and  has been authorized for detection and/or diagnosis of SARS-CoV-2 by FDA under an Emergency Use Authorization (EUA). This EUA will remain  in effect (meaning this test can be used) for the duration of the COVID-19 declaration under Section 564(b)(1) of the Act, 21 U.S.C. section 360bbb-3(b)(1), unless the authorization is terminated or revoked sooner.     Influenza A by PCR NEGATIVE NEGATIVE Final   Influenza B by PCR NEGATIVE NEGATIVE Final    Comment: (NOTE) The Xpert Xpress SARS-CoV-2/FLU/RSV assay is intended as an aid in  the diagnosis of influenza from Nasopharyngeal swab specimens and  should not be used as a sole basis for treatment. Nasal washings and  aspirates are unacceptable for Xpert Xpress SARS-CoV-2/FLU/RSV  testing.  Fact Sheet for Patients: PinkCheek.be  Fact Sheet for Healthcare Providers: GravelBags.it  This test is not yet approved or cleared by the [place] FDA and  has been authorized for detection and/or diagnosis of SARS-CoV-2 by  FDA under an Emergency Use Authorization (EUA). This EUA will remain  in effect (meaning this test  can be used) for the duration of the  Covid-19 declaration under Section 564(b)(1) of the Act, 21  U.S.C. section 360bbb-3(b)(1), unless the authorization is  terminated or revoked. Performed at [facility], [address] [place], [place] [address]   SARS Coronavirus 2 by RT PCR (hospital order, performed in [facility] hospital lab) Nasopharyngeal Nasopharyngeal Swab     Status: None   Collection Time: 11/22/19 10:35 AM   Specimen: Nasopharyngeal Swab  Result Value Ref Range Status   SARS Coronavirus 2 NEGATIVE NEGATIVE Final    Comment: (NOTE) SARS-CoV-2 target nucleic acids are NOT DETECTED.  The SARS-CoV-2 RNA is generally detectable in upper and lower respiratory specimens during the acute phase of infection. The lowest concentration of SARS-CoV-2 viral copies this assay can detect is 250 copies / mL. A negative result does not preclude SARS-CoV-2 infection and should not be used as the sole basis for treatment or other patient management decisions.  A negative result may occur with improper specimen collection / handling, submission of specimen other than nasopharyngeal swab, presence of viral mutation(s) within the areas targeted by this assay, and inadequate number of viral copies (<250 copies / mL). A negative result must be combined with clinical observations, patient history, and epidemiological information.  Fact Sheet for Patients:   StrictlyIdeas.no  Fact Sheet for Healthcare Providers: BankingDealers.co.za  This test is not yet approved or  cleared by the [place] FDA and has been authorized for detection and/or diagnosis of SARS-CoV-2 by FDA under an Emergency Use Authorization (EUA).  This EUA will remain in effect (meaning this test can be used) for the duration of the COVID-19 declaration under Section 564(b)(1) of the Act, 21 U.S.C. section 360bbb-3(b)(1), unless the authorization is terminated or revoked  sooner.  Performed at [facility], [address] [place], [place] [address]      Time coordinating discharge: 46mins  SIGNED:   Kathie Dike, MD  [facility] 11/22/2019, 12:40 PM   If 7PM-7AM, please contact night-coverage [email or web address]

## 2019-11-22 NOTE — Progress Notes (Signed)
IV removed and report called to [facility] nurse Phonecia Kellie Simmering.  EMS here to take paitent.  To follow up with ortho on Oct 20.

## 2019-11-22 NOTE — Care Management Important Message (Signed)
Important Message  Patient Details  Name: Lee Wang MRN: [id number] Date of Birth: [birth year]   Medicare Important Message Given:  Yes     Tommy Medal 11/22/2019, 12:20 PM

## 2019-11-22 NOTE — TOC Transition Note (Signed)
Transition of Care [facility]) - CM/SW Discharge Note   Patient Details  Name: ANTRON SETH MRN: [id number] Date of Birth: 1945-07-26  Transition of Care [facility]) CM/SW Contact:  Boneta Lucks, RN Phone Number: 11/22/2019, 10:53 AM   Clinical Narrative:   Ship broker received per Ebony Hail, She provided number for report. RN repeating COVID test and will call report, Clinicals will be sent in the hub. TOC printed Medical necessity and will call  EMS when RN is ready.    Final next level of care: Skilled Nursing Facility Barriers to Discharge: Barriers Resolved   Patient Goals and CMS Choice Patient states their goals for this hospitalization and ongoing recovery are:: To go to SNF then return home. CMS Medicare.gov Compare Post Acute Care list provided to:: Patient Represenative (must comment) Choice offered to / list presented to : Adult Children  Discharge Placement              Patient chooses bed at: [facility] Patient to be transferred to facility by: EMS Name of family member notified: Elta Guadeloupe Patient and family notified of of transfer: 11/22/19  Discharge Plan and Services In-house Referral: Clinical Social Work Discharge Planning Services: NA Post Acute Care Choice: [place]          DME Arranged: N/A DME Agency: NA       HH Arranged: NA [place]aria Arenas Agency: NA

## 2019-11-23 ENCOUNTER — Telehealth: Payer: Self-pay | Admitting: Radiology

## 2019-11-23 NOTE — Telephone Encounter (Signed)
Notification or Prior Authorization is not required for the requested services// ORIF ankle right   Decision ID #:[id number]

## 2019-11-23 NOTE — Telephone Encounter (Signed)
Spoke with Lee Wang and she will arrange transportation for the appointments for this patient.

## 2019-11-23 NOTE — Telephone Encounter (Signed)
Amy check on [facility] Medicare and no authorization is needed per insurance.   Per Dr. Amedeo [name] since this was a Trauma it is billed different since he was on call and it was in the hospital. No other concerns.

## 2019-11-24 DIAGNOSIS — I10 Essential (primary) hypertension: Secondary | ICD-10-CM | POA: Diagnosis not present

## 2019-11-24 DIAGNOSIS — E871 Hypo-osmolality and hyponatremia: Secondary | ICD-10-CM | POA: Diagnosis not present

## 2019-11-24 DIAGNOSIS — S82853A Displaced trimalleolar fracture of unspecified lower leg, initial encounter for closed fracture: Secondary | ICD-10-CM | POA: Diagnosis not present

## 2019-11-27 DIAGNOSIS — S82853A Displaced trimalleolar fracture of unspecified lower leg, initial encounter for closed fracture: Secondary | ICD-10-CM | POA: Diagnosis not present

## 2019-11-27 DIAGNOSIS — S2239XA Fracture of one rib, unspecified side, initial encounter for closed fracture: Secondary | ICD-10-CM | POA: Diagnosis not present

## 2019-11-27 DIAGNOSIS — I10 Essential (primary) hypertension: Secondary | ICD-10-CM | POA: Diagnosis not present

## 2019-11-28 ENCOUNTER — Telehealth: Payer: Self-pay | Admitting: Radiology

## 2019-11-28 NOTE — Telephone Encounter (Signed)
-----   Message from Encarnacion Chu, RN sent at 11/28/2019 10:50 AM EDT ----- Regarding: labs Hey [place] Lesure! Will you ask Dr Amedeo [name] if he wants labs repeated on Lee Wang? He had labs drawn in ED on 11/22/2019 and were WNL. Thank you!

## 2019-11-28 NOTE — Telephone Encounter (Signed)
I put in a note on 11/23/19 Notification or Prior Authorization is not required for the requested services// ORIF ankle right   Decision ID #:[id number]

## 2019-11-28 NOTE — Telephone Encounter (Signed)
See below per Amy, no PA needed.

## 2019-11-28 NOTE — Telephone Encounter (Signed)
I see Barnet Pall mentioned you below in a note about checking for auth, did you do this already?  It is [facility], let me know if I need to call to see if auth needed?  I am not sure how this would work as I don't believe he is credentialed with [facility] yet.

## 2019-11-28 NOTE — Telephone Encounter (Signed)
Will I need to see if his surgery will need pre-certification? Please let me know. Its scheduled for 11/30/2019.

## 2019-11-29 ENCOUNTER — Encounter (HOSPITAL_COMMUNITY): Payer: Self-pay

## 2019-11-29 ENCOUNTER — Other Ambulatory Visit: Payer: Self-pay

## 2019-11-29 ENCOUNTER — Encounter (HOSPITAL_COMMUNITY)
Admission: RE | Admit: 2019-11-29 | Discharge: 2019-11-29 | Disposition: A | Discharge status: Home / Self Care | Payer: Medicare Other | Source: Ambulatory Visit | Attending: Orthopedic Surgery | Admitting: Orthopedic Surgery

## 2019-11-29 ENCOUNTER — Ambulatory Visit (INDEPENDENT_AMBULATORY_CARE_PROVIDER_SITE_OTHER): Payer: Medicare Other | Admitting: Orthopedic Surgery

## 2019-11-29 ENCOUNTER — Encounter: Payer: Self-pay | Admitting: Orthopedic Surgery

## 2019-11-29 VITALS — BP 103/65 | HR 79

## 2019-11-29 DIAGNOSIS — S82891A Other fracture of right lower leg, initial encounter for closed fracture: Secondary | ICD-10-CM

## 2019-11-29 NOTE — Patient Instructions (Addendum)
1.  preop appointment at the hospital today 2.  F/u 10-14 days after surgery

## 2019-11-29 NOTE — Patient Instructions (Signed)
Lee Wang  11/29/2019     @PREFPERIOPPHARMACY @   Your procedure is scheduled on  11/30/2019.  Report to [facility] at  0800  A.M.  Call this number if you have problems the morning of surgery:  [phone]   Remember:  Do not eat or drink after midnight.                          Take these medicines the morning of surgery with A SIP OF WATER  Xanax(if needed), lexapro, oxycodone (if needed),propranolol    Do not wear jewelry, make-up or nail polish.  Do not wear lotions, powders, or perfumes. Please wear deodorant and brush your teeth.  Do not shave 48 hours prior to surgery.  Men may shave face and neck.  Do not bring valuables to the hospital.  [facility] is not responsible for any belongings or valuables.  Contacts, dentures or bridgework may not be worn into surgery.  Leave your suitcase in the car.  After surgery it may be brought to your room.  For patients admitted to the hospital, discharge time will be determined by your treatment team.  Patients discharged the day of surgery will not be allowed to drive home.   Name and phone number of your driver:   family Special instructions:  DO NOT smoke the morning of your procedure.  Please read over the following fact sheets that you were given. Anesthesia Post-op Instructions and Care and Recovery After Surgery       Displaced Bimalleolar Ankle Fracture Treated With ORIF, Care After This sheet gives you information about how to care for yourself after your procedure. Your health care provider may also give you more specific instructions. If you have problems or questions, contact your health care provider. What can I expect after the procedure? After the procedure, it is common to have:  Pain.  Swelling.  A small amount of fluid from your incision. Follow these instructions at home: If you have a splint or boot:  Wear the splint or boot as told by your health care provider. Remove it only  as told by your health care provider.  Loosen the splint or boot if your toes tingle, become numb, or turn cold and blue.  Keep the splint or boot clean.  If the splint or boot is not waterproof: ? Do not let it get wet. ? Cover it with a watertight covering when you take a bath or a shower. If you have a cast:  Do not stick anything inside the cast to scratch your skin. Doing that increases your risk of infection.  Check the skin around the cast every day. Tell your health care provider about any concerns.  You may put lotion on dry skin around the edges of the cast. Do not put lotion on the skin underneath the cast.  Keep the cast clean.  If the cast is not waterproof: ? Do not let it get wet. ? Cover it with a watertight covering when you take a bath or a shower. Bathing  Do not take baths, swim, or use a hot tub until your health care provider approves. Ask your health care provider if you can take showers.  If your splint, boot, or cast is not waterproof, cover it with a watertight covering when you take a bath or a shower.  Keep the bandage (dressing) dry until your health  care provider says it can be removed. Incision care   Follow instructions from your health care provider about how to take care of your incision. Make sure you: ? Wash your hands with soap and water before you change your bandage (dressing). If soap and water are not available, use hand sanitizer. ? Change your dressing as told by your health care provider. ? Leave stitches (sutures), skin glue, or adhesive strips in place. These skin closures may need to stay in place for 2 weeks or longer. If adhesive strip edges start to loosen and curl up, you may trim the loose edges. Do not remove adhesive strips completely unless your health care provider tells you to do that.  Check your incision area every day for signs of infection. Check for: ? Redness. ? More pain or swelling. ? Blood or more  fluid. ? Warmth. ? Pus or a bad smell. Managing pain, stiffness, and swelling   If directed, put ice on the affected area. ? If you have a removable splint or boot, remove it as told by your health care provider. ? Put ice in a plastic bag. ? Place a towel between your skin and the bag or between your cast and the bag. ? Leave the ice on for 20 minutes, 2-3 times a day.  Move your toes often to avoid stiffness and to lessen swelling.  Raise (elevate) the injured area above the level of your heart while you are sitting or lying down. To do this, try putting a few pillows under your leg and ankle. Driving  Do not drive or use heavy machinery while taking prescription pain medicine.  Ask your health care provider when it is safe to drive if you have a splint, boot, or cast on your foot. Activity  Return to your normal activities as told by your health care provider. Ask your health care provider what activities are safe for you.  Do exercises as told by your health care provider or physical therapist.  Do not use your injured limb to support (bear) your body weight until your health care provider says that you can. Follow weight-bearing restrictions as told. Use crutches or other devices to help you move around (assistive devices) as directed. General instructions  Do not put pressure on any part of the splint or cast until it is fully hardened. This may take several hours.  Do not use any products that contain nicotine or tobacco, such as cigarettes and e-cigarettes. These can delay bone healing. If you need help quitting, ask your health care provider.  Take over-the-counter and prescription medicines only as told by your health care provider.  If you are taking prescription pain medicine, take actions to prevent or treat constipation. Your health care provider may recommend that you: ? Drink enough fluid to keep your urine pale yellow. ? Eat foods that are high in fiber, such as  fresh fruits and vegetables, whole grains, and beans. ? Limit foods that are high in fat and processed sugars, such as fried or sweet foods. ? Take an over-the-counter or prescription medicine for constipation.  Keep all follow-up visits as told by your health care provider. This is important. Contact a health care provider if:  You have a fever.  Your pain medicine is not helping.  You have redness around your incision.  You have more swelling or pain around your incision.  You have more fluid or blood coming from your incision or leaking through your cast.  Your  incision feels warm to the touch.  You have pus or a bad smell coming from your incision or from your cast or dressing. Get help right away if:  The edges of your incision come apart after the stitches or staples have been taken out.  You have chest pain.  You have difficulty breathing.  You have numbness or tingling in your foot or leg.  Your foot becomes cold, pale, or blue. Summary  After the procedure, it is common to have some pain and swelling.  If your splint, boot, or cast is not waterproof, do not let it get wet.  Contact your health care provider if you have severe pain or swelling, or if you have more fluids coming from your incision or leaking through your cast.  Get help right away if you have numbness or tingling in your foot or leg, or if your foot becomes cold, pale, or blue. This information is not intended to replace advice given to you by your health care provider. Make sure you discuss any questions you have with your health care provider. Document Revised: 01/08/2017 Document Reviewed: 11/11/2016 Elsevier Patient Education  2020 [place] Anesthesia, Adult, Care After This sheet gives you information about how to care for yourself after your procedure. Your health care provider may also give you more specific instructions. If you have problems or questions, contact your health  care provider. What can I expect after the procedure? After the procedure, the following side effects are common:  Pain or discomfort at the IV site.  Nausea.  Vomiting.  Sore throat.  Trouble concentrating.  Feeling cold or chills.  Weak or tired.  Sleepiness and fatigue.  Soreness and body aches. These side effects can affect parts of the body that were not involved in surgery. Follow these instructions at home:  For at least 24 hours after the procedure:  Have a responsible adult stay with you. It is important to have someone help care for you until you are awake and alert.  Rest as needed.  Do not: ? Participate in activities in which you could fall or become injured. ? Drive. ? Use heavy machinery. ? Drink alcohol. ? Take sleeping pills or medicines that cause drowsiness. ? Make important decisions or sign legal documents. ? Take care of children on your own. Eating and drinking  Follow any instructions from your health care provider about eating or drinking restrictions.  When you feel hungry, start by eating small amounts of foods that are soft and easy to digest (bland), such as toast. Gradually return to your regular diet.  Drink enough fluid to keep your urine pale yellow.  If you vomit, rehydrate by drinking water, juice, or clear broth. General instructions  If you have sleep apnea, surgery and certain medicines can increase your risk for breathing problems. Follow instructions from your health care provider about wearing your sleep device: ? Anytime you are sleeping, including during daytime naps. ? While taking prescription pain medicines, sleeping medicines, or medicines that make you drowsy.  Return to your normal activities as told by your health care provider. Ask your health care provider what activities are safe for you.  Take over-the-counter and prescription medicines only as told by your health care provider.  If you smoke, do not smoke  without supervision.  Keep all follow-up visits as told by your health care provider. This is important. Contact a health care provider if:  You have nausea or vomiting that does  not get better with medicine.  You cannot eat or drink without vomiting.  You have pain that does not get better with medicine.  You are unable to pass urine.  You develop a skin rash.  You have a fever.  You have redness around your IV site that gets worse. Get help right away if:  You have difficulty breathing.  You have chest pain.  You have blood in your urine or stool, or you vomit blood. Summary  After the procedure, it is common to have a sore throat or nausea. It is also common to feel tired.  Have a responsible adult stay with you for the first 24 hours after general anesthesia. It is important to have someone help care for you until you are awake and alert.  When you feel hungry, start by eating small amounts of foods that are soft and easy to digest (bland), such as toast. Gradually return to your regular diet.  Drink enough fluid to keep your urine pale yellow.  Return to your normal activities as told by your health care provider. Ask your health care provider what activities are safe for you. This information is not intended to replace advice given to you by your health care provider. Make sure you discuss any questions you have with your health care provider. Document Revised: 01/29/2017 Document Reviewed: 09/11/2016 Elsevier Patient Education  [place]. How to Use Chlorhexidine for Bathing Chlorhexidine gluconate (CHG) is a germ-killing (antiseptic) solution that is used to clean the skin. It can get rid of the bacteria that normally live on the skin and can keep them away for about 24 hours. To clean your skin with CHG, you may be given:  A CHG solution to use in the shower or as part of a sponge bath.  A prepackaged cloth that contains CHG. Cleaning your skin with CHG  may help lower the risk for infection:  While you are staying in the intensive care unit of the hospital.  If you have a vascular access, such as a central line, to provide short-term or long-term access to your veins.  If you have a catheter to drain urine from your bladder.  If you are on a ventilator. A ventilator is a machine that helps you breathe by moving air in and out of your lungs.  After surgery. What are the risks? Risks of using CHG include:  A skin reaction.  Hearing loss, if CHG gets in your ears.  Eye injury, if CHG gets in your eyes and is not rinsed out.  The CHG product catching fire. Make sure that you avoid smoking and flames after applying CHG to your skin. Do not use CHG:  If you have a chlorhexidine allergy or have previously reacted to chlorhexidine.  On babies younger than 32 months of age. How to use CHG solution  Use CHG only as told by your health care provider, and follow the instructions on the label.  Use the full amount of CHG as directed. Usually, this is one bottle. During a shower Follow these steps when using CHG solution during a shower (unless your health care provider gives you different instructions): 1. Start the shower. 2. Use your normal soap and shampoo to wash your face and hair. 3. Turn off the shower or move out of the shower stream. 4. Pour the CHG onto a clean washcloth. Do not use any type of brush or rough-edged sponge. 5. Starting at your neck, lather your body down  to your toes. Make sure you follow these instructions: ? If you will be having surgery, pay special attention to the part of your body where you will be having surgery. Scrub this area for at least 1 minute. ? Do not use CHG on your head or face. If the solution gets into your ears or eyes, rinse them well with water. ? Avoid your genital area. ? Avoid any areas of skin that have broken skin, cuts, or scrapes. ? Scrub your back and under your arms. Make sure to  wash skin folds. 6. Let the lather sit on your skin for 1-2 minutes or as long as told by your health care provider. 7. Thoroughly rinse your entire body in the shower. Make sure that all body creases and crevices are rinsed well. 8. Dry off with a clean towel. Do not put any substances on your body afterward--such as powder, lotion, or perfume--unless you are told to do so by your health care provider. Only use lotions that are recommended by the manufacturer. 9. Put on clean clothes or pajamas. 10. If it is the night before your surgery, sleep in clean sheets.  During a sponge bath Follow these steps when using CHG solution during a sponge bath (unless your health care provider gives you different instructions): 1. Use your normal soap and shampoo to wash your face and hair. 2. Pour the CHG onto a clean washcloth. 3. Starting at your neck, lather your body down to your toes. Make sure you follow these instructions: ? If you will be having surgery, pay special attention to the part of your body where you will be having surgery. Scrub this area for at least 1 minute. ? Do not use CHG on your head or face. If the solution gets into your ears or eyes, rinse them well with water. ? Avoid your genital area. ? Avoid any areas of skin that have broken skin, cuts, or scrapes. ? Scrub your back and under your arms. Make sure to wash skin folds. 4. Let the lather sit on your skin for 1-2 minutes or as long as told by your health care provider. 5. Using a different clean, wet washcloth, thoroughly rinse your entire body. Make sure that all body creases and crevices are rinsed well. 6. Dry off with a clean towel. Do not put any substances on your body afterward--such as powder, lotion, or perfume--unless you are told to do so by your health care provider. Only use lotions that are recommended by the manufacturer. 7. Put on clean clothes or pajamas. 8. If it is the night before your surgery, sleep in clean  sheets. How to use CHG prepackaged cloths  Only use CHG cloths as told by your health care provider, and follow the instructions on the label.  Use the CHG cloth on clean, dry skin.  Do not use the CHG cloth on your head or face unless your health care provider tells you to.  When washing with the CHG cloth: ? Avoid your genital area. ? Avoid any areas of skin that have broken skin, cuts, or scrapes. Before surgery Follow these steps when using a CHG cloth to clean before surgery (unless your health care provider gives you different instructions): 1. Using the CHG cloth, vigorously scrub the part of your body where you will be having surgery. Scrub using a back-and-forth motion for 3 minutes. The area on your body should be completely wet with CHG when you are done scrubbing. 2. Do  not rinse. Discard the cloth and let the area air-dry. Do not put any substances on the area afterward, such as powder, lotion, or perfume. 3. Put on clean clothes or pajamas. 4. If it is the night before your surgery, sleep in clean sheets.  For general bathing Follow these steps when using CHG cloths for general bathing (unless your health care provider gives you different instructions). 1. Use a separate CHG cloth for each area of your body. Make sure you wash between any folds of skin and between your fingers and toes. Wash your body in the following order, switching to a new cloth after each step: ? The front of your neck, shoulders, and chest. ? Both of your arms, under your arms, and your hands. ? Your stomach and groin area, avoiding the genitals. ? Your right leg and foot. ? Your left leg and foot. ? The back of your neck, your back, and your buttocks. 2. Do not rinse. Discard the cloth and let the area air-dry. Do not put any substances on your body afterward--such as powder, lotion, or perfume--unless you are told to do so by your health care provider. Only use lotions that are recommended by the  manufacturer. 3. Put on clean clothes or pajamas. Contact a health care provider if:  Your skin gets irritated after scrubbing.  You have questions about using your solution or cloth. Get help right away if:  Your eyes become very red or swollen.  Your eyes itch badly.  Your skin itches badly and is red or swollen.  Your hearing changes.  You have trouble seeing.  You have swelling or tingling in your mouth or throat.  You have trouble breathing.  You swallow any chlorhexidine. Summary  Chlorhexidine gluconate (CHG) is a germ-killing (antiseptic) solution that is used to clean the skin. Cleaning your skin with CHG may help to lower your risk for infection.  You may be given CHG to use for bathing. It may be in a bottle or in a prepackaged cloth to use on your skin. Carefully follow your health care provider's instructions and the instructions on the product label.  Do not use CHG if you have a chlorhexidine allergy.  Contact your health care provider if your skin gets irritated after scrubbing. This information is not intended to replace advice given to you by your health care provider. Make sure you discuss any questions you have with your health care provider. Document Revised: 04/14/2018 Document Reviewed: 12/24/2016 Elsevier Patient Education  [place].

## 2019-11-29 NOTE — Progress Notes (Signed)
Orthopaedic Clinic Return  Assessment: Lee Wang is a 74 y.o. male with the following: 1.  Right ankle fracture dislocation; required reduction in OR  Plan: Patient scheduled for surgery 11/30/19.  Wrinkles present over the front of the ankle.  Small blister over dorsal forefoot.  Ok to proceed with surgery.  He is to attend his preop appointment this morning.  He will return to SNF after surgery.  All questions were answered.  Follow up approximately 10-14 days after surgery.   Follow-up: Return in about 2 weeks (around 12/13/2019) for After surgery; DOS 11/30/19.   Subjective:  Chief Complaint  Patient presents with  . Follow-up    Right Ankle Fracture    History of Present Illness: Lee Wang is a 73 y.o. male who presents for follow-up evaluation of his right ankle. He sustained a right ankle fracture dislocation which required reduction in the operating room. He was admitted following this procedure, and subsequently discharged to a skilled nursing facility. He has done well with the splint. Pain in his ankle has been controlled. He is not taking much medication at this time. No numbness or tingling appreciated. Patient does have a history of alcoholism, but has had no withdrawal symptoms while at the skilled nursing facility.  Review of Systems: No fevers or chills No numbness or tingling No chest pain No shortness of breath  Objective: BP 103/65   Pulse 79   Physical Exam: Patient is thin, older male. Seated in a wheelchair.  Evaluation of the right lower extremity demonstrates a splint that is clean, dry and intact. He does have a fair amount of ecchymosis over the dorsal forefoot, as well as his toes. There is a small hemorrhagic fracture blister on the dorsum of his forefoot. The splint remained in place, but we removed the cast padding over the anterior aspect of the ankle, upon review the swelling had improved. Wrinkles are present over the anterior ankle.  The medial fracture line was easily palpated. He is able to wiggle his toes.  IMAGING: I personally ordered and reviewed the following images:   No new imaging obtained today.  Mordecai Rasmussen, MD 11/29/2019 9:21 AM

## 2019-11-29 NOTE — H&P (View-Only) (Signed)
Orthopaedic Clinic Return  Assessment: Lee Wang is a 74 y.o. male with the following: 1.  Right ankle fracture dislocation; required reduction in OR  Plan: Patient scheduled for surgery 11/30/19.  Wrinkles present over the front of the ankle.  Small blister over dorsal forefoot.  Ok to proceed with surgery.  He is to attend his preop appointment this morning.  He will return to SNF after surgery.  All questions were answered.  Follow up approximately 10-14 days after surgery.   Follow-up: Return in about 2 weeks (around 12/13/2019) for After surgery; DOS 11/30/19.   Subjective:  Chief Complaint  Patient presents with   Follow-up    Right Ankle Fracture    History of Present Illness: Lee Wang is a 74 y.o. male who presents for follow-up evaluation of his right ankle. He sustained a right ankle fracture dislocation which required reduction in the operating room. He was admitted following this procedure, and subsequently discharged to a skilled nursing facility. He has done well with the splint. Pain in his ankle has been controlled. He is not taking much medication at this time. No numbness or tingling appreciated. Patient does have a history of alcoholism, but has had no withdrawal symptoms while at the skilled nursing facility.  Review of Systems: No fevers or chills No numbness or tingling No chest pain No shortness of breath  Objective: BP 103/65    Pulse 79   Physical Exam: Patient is thin, older male. Seated in a wheelchair.  Evaluation of the right lower extremity demonstrates a splint that is clean, dry and intact. He does have a fair amount of ecchymosis over the dorsal forefoot, as well as his toes. There is a small hemorrhagic fracture blister on the dorsum of his forefoot. The splint remained in place, but we removed the cast padding over the anterior aspect of the ankle, upon review the swelling had improved. Wrinkles are present over the anterior ankle.  The medial fracture line was easily palpated. He is able to wiggle his toes.  IMAGING: I personally ordered and reviewed the following images:   No new imaging obtained today.  Mordecai Rasmussen, MD 11/29/2019 9:21 AM

## 2019-11-30 ENCOUNTER — Ambulatory Visit (HOSPITAL_COMMUNITY): Payer: Medicare Other

## 2019-11-30 ENCOUNTER — Encounter (HOSPITAL_COMMUNITY)
Admission: RE | Disposition: A | Discharge status: Home / Self Care | Payer: Self-pay | Source: Home / Self Care | Attending: Orthopedic Surgery

## 2019-11-30 ENCOUNTER — Ambulatory Visit (HOSPITAL_COMMUNITY)
Admission: RE | Admit: 2019-11-30 | Discharge: 2019-11-30 | Disposition: A | Discharge status: Home / Self Care | Payer: Medicare Other | Attending: Orthopedic Surgery | Admitting: Orthopedic Surgery

## 2019-11-30 ENCOUNTER — Ambulatory Visit (HOSPITAL_COMMUNITY): Payer: Medicare Other | Admitting: Anesthesiology

## 2019-11-30 ENCOUNTER — Encounter (HOSPITAL_COMMUNITY): Payer: Self-pay | Admitting: Orthopedic Surgery

## 2019-11-30 DIAGNOSIS — Z20822 Contact with and (suspected) exposure to covid-19: Secondary | ICD-10-CM | POA: Insufficient documentation

## 2019-11-30 DIAGNOSIS — F17210 Nicotine dependence, cigarettes, uncomplicated: Secondary | ICD-10-CM | POA: Diagnosis not present

## 2019-11-30 DIAGNOSIS — S82891A Other fracture of right lower leg, initial encounter for closed fracture: Secondary | ICD-10-CM

## 2019-11-30 DIAGNOSIS — S82831D Other fracture of upper and lower end of right fibula, subsequent encounter for closed fracture with routine healing: Secondary | ICD-10-CM | POA: Diagnosis not present

## 2019-11-30 DIAGNOSIS — G8918 Other acute postprocedural pain: Secondary | ICD-10-CM | POA: Diagnosis not present

## 2019-11-30 DIAGNOSIS — S82391D Other fracture of lower end of right tibia, subsequent encounter for closed fracture with routine healing: Secondary | ICD-10-CM | POA: Diagnosis not present

## 2019-11-30 DIAGNOSIS — S82841A Displaced bimalleolar fracture of right lower leg, initial encounter for closed fracture: Secondary | ICD-10-CM | POA: Insufficient documentation

## 2019-11-30 DIAGNOSIS — W19XXXA Unspecified fall, initial encounter: Secondary | ICD-10-CM | POA: Diagnosis not present

## 2019-11-30 DIAGNOSIS — S82899A Other fracture of unspecified lower leg, initial encounter for closed fracture: Secondary | ICD-10-CM

## 2019-11-30 DIAGNOSIS — S82391A Other fracture of lower end of right tibia, initial encounter for closed fracture: Secondary | ICD-10-CM | POA: Diagnosis not present

## 2019-11-30 DIAGNOSIS — F17200 Nicotine dependence, unspecified, uncomplicated: Secondary | ICD-10-CM | POA: Insufficient documentation

## 2019-11-30 DIAGNOSIS — S82844A Nondisplaced bimalleolar fracture of right lower leg, initial encounter for closed fracture: Secondary | ICD-10-CM | POA: Diagnosis not present

## 2019-11-30 DIAGNOSIS — I10 Essential (primary) hypertension: Secondary | ICD-10-CM | POA: Insufficient documentation

## 2019-11-30 DIAGNOSIS — Z9889 Other specified postprocedural states: Secondary | ICD-10-CM

## 2019-11-30 DIAGNOSIS — Z8781 Personal history of (healed) traumatic fracture: Secondary | ICD-10-CM

## 2019-11-30 DIAGNOSIS — Z09 Encounter for follow-up examination after completed treatment for conditions other than malignant neoplasm: Secondary | ICD-10-CM

## 2019-11-30 DIAGNOSIS — S82831A Other fracture of upper and lower end of right fibula, initial encounter for closed fracture: Secondary | ICD-10-CM | POA: Diagnosis not present

## 2019-11-30 HISTORY — PX: ORIF ANKLE FRACTURE: SHX5408

## 2019-11-30 HISTORY — PX: HARDWARE REMOVAL: SHX979

## 2019-11-30 LAB — RESP PANEL BY RT PCR (RSV, FLU A&B, COVID)
Influenza A by PCR: NEGATIVE
Influenza B by PCR: NEGATIVE
Respiratory Syncytial Virus by PCR: NEGATIVE
SARS Coronavirus 2 by RT PCR: NEGATIVE

## 2019-11-30 SURGERY — OPEN REDUCTION INTERNAL FIXATION (ORIF) ANKLE FRACTURE
Anesthesia: General | Site: Ankle | Laterality: Right

## 2019-11-30 MED ORDER — TRANEXAMIC ACID-NACL 1000-0.7 MG/100ML-% IV SOLN
1000.0000 mg | INTRAVENOUS | Status: AC
  Administered 2019-11-30: 1000 mg via INTRAVENOUS
  Filled 2019-11-30: qty 100

## 2019-11-30 MED ORDER — GABAPENTIN 100 MG PO CAPS: 100.0000 mg | ORAL_CAPSULE | Freq: Three times a day (TID) | ORAL | 0 refills | Status: DC

## 2019-11-30 MED ORDER — FENTANYL CITRATE (PF) 100 MCG/2ML IJ SOLN
INTRAMUSCULAR | Status: AC
  Filled 2019-11-30: qty 2

## 2019-11-30 MED ORDER — FENTANYL CITRATE (PF) 100 MCG/2ML IJ SOLN: 25.0000 ug | INTRAMUSCULAR | Status: DC | PRN

## 2019-11-30 MED ORDER — OXYCODONE HCL 5 MG PO TABS: 5.0000 mg | ORAL_TABLET | ORAL | 0 refills | Status: AC | PRN

## 2019-11-30 MED ORDER — PROPOFOL 10 MG/ML IV BOLUS
INTRAVENOUS | Status: AC
  Filled 2019-11-30: qty 20

## 2019-11-30 MED ORDER — ASPIRIN EC 81 MG PO TBEC: 81.0000 mg | DELAYED_RELEASE_TABLET | Freq: Two times a day (BID) | ORAL | 0 refills | Status: AC

## 2019-11-30 MED ORDER — BUPIVACAINE HCL (PF) 0.5 % IJ SOLN
INTRAMUSCULAR | Status: AC
  Filled 2019-11-30: qty 30

## 2019-11-30 MED ORDER — ONDANSETRON HCL 4 MG/2ML IJ SOLN
INTRAMUSCULAR | Status: DC | PRN
  Administered 2019-11-30: 4 mg via INTRAVENOUS

## 2019-11-30 MED ORDER — CEFAZOLIN SODIUM-DEXTROSE 2-4 GM/100ML-% IV SOLN
2.0000 g | INTRAVENOUS | Status: AC
  Administered 2019-11-30: 2 g via INTRAVENOUS
  Filled 2019-11-30: qty 100

## 2019-11-30 MED ORDER — DEXAMETHASONE SODIUM PHOSPHATE 10 MG/ML IJ SOLN
INTRAMUSCULAR | Status: DC | PRN
  Administered 2019-11-30: 6 mg via INTRAVENOUS

## 2019-11-30 MED ORDER — 0.9 % SODIUM CHLORIDE (POUR BTL) OPTIME
TOPICAL | Status: DC | PRN
  Administered 2019-11-30: 1000 mL

## 2019-11-30 MED ORDER — MIDAZOLAM HCL 2 MG/2ML IJ SOLN
INTRAMUSCULAR | Status: AC
  Filled 2019-11-30: qty 4

## 2019-11-30 MED ORDER — ACETAMINOPHEN 500 MG PO TABS: 1000.0000 mg | ORAL_TABLET | Freq: Three times a day (TID) | ORAL | 0 refills | Status: DC

## 2019-11-30 MED ORDER — DEXAMETHASONE SODIUM PHOSPHATE 10 MG/ML IJ SOLN
INTRAMUSCULAR | Status: AC
  Filled 2019-11-30: qty 1

## 2019-11-30 MED ORDER — GLYCOPYRROLATE PF 0.2 MG/ML IJ SOSY
PREFILLED_SYRINGE | INTRAMUSCULAR | Status: AC
  Filled 2019-11-30: qty 1

## 2019-11-30 MED ORDER — FENTANYL CITRATE (PF) 100 MCG/2ML IJ SOLN
INTRAMUSCULAR | Status: DC | PRN
  Administered 2019-11-30 (×4): 25 ug via INTRAVENOUS

## 2019-11-30 MED ORDER — PROPOFOL 10 MG/ML IV BOLUS
INTRAVENOUS | Status: DC | PRN
  Administered 2019-11-30: 40 mg via INTRAVENOUS
  Administered 2019-11-30: 75 ug/kg/min via INTRAVENOUS

## 2019-11-30 MED ORDER — LACTATED RINGERS IV SOLN: INTRAVENOUS | Status: DC

## 2019-11-30 MED ORDER — CHLORHEXIDINE GLUCONATE 0.12 % MT SOLN: 15.0000 mL | Freq: Once | OROMUCOSAL | Status: DC

## 2019-11-30 MED ORDER — LIDOCAINE HCL (PF) 1 % IJ SOLN
INTRAMUSCULAR | Status: AC
  Filled 2019-11-30: qty 4

## 2019-11-30 MED ORDER — LIDOCAINE HCL (PF) 2 % IJ SOLN
INTRAMUSCULAR | Status: AC
  Filled 2019-11-30: qty 10

## 2019-11-30 MED ORDER — PROPOFOL 10 MG/ML IV BOLUS
INTRAVENOUS | Status: AC
  Filled 2019-11-30: qty 40

## 2019-11-30 MED ORDER — DOCUSATE SODIUM 100 MG PO CAPS: 100.0000 mg | ORAL_CAPSULE | Freq: Every day | ORAL | 0 refills | Status: AC | PRN

## 2019-11-30 MED ORDER — LIDOCAINE HCL (CARDIAC) PF 100 MG/5ML IV SOSY
PREFILLED_SYRINGE | INTRAVENOUS | Status: DC | PRN
  Administered 2019-11-30 (×2): 2.5 mL via INTRAVENOUS

## 2019-11-30 MED ORDER — MIDAZOLAM HCL 5 MG/5ML IJ SOLN
INTRAMUSCULAR | Status: DC | PRN
  Administered 2019-11-30 (×2): 2 mg via INTRAVENOUS

## 2019-11-30 MED ORDER — ONDANSETRON HCL 4 MG/2ML IJ SOLN: 4.0000 mg | Freq: Once | INTRAMUSCULAR | Status: DC | PRN

## 2019-11-30 MED ORDER — BUPIVACAINE HCL (PF) 0.5 % IJ SOLN
INTRAMUSCULAR | Status: DC | PRN
  Administered 2019-11-30: 10 mL
  Administered 2019-11-30: 20 mL

## 2019-11-30 MED ORDER — ONDANSETRON HCL 4 MG PO TABS: 4.0000 mg | ORAL_TABLET | Freq: Three times a day (TID) | ORAL | 0 refills | Status: DC | PRN

## 2019-11-30 MED ORDER — PHENYLEPHRINE HCL (PRESSORS) 10 MG/ML IV SOLN
INTRAVENOUS | Status: DC | PRN
  Administered 2019-11-30 (×4): 80 ug via INTRAVENOUS

## 2019-11-30 MED ORDER — PHENYLEPHRINE 40 MCG/ML (10ML) SYRINGE FOR IV PUSH (FOR BLOOD PRESSURE SUPPORT)
PREFILLED_SYRINGE | INTRAVENOUS | Status: AC
  Filled 2019-11-30: qty 10

## 2019-11-30 MED ORDER — GLYCOPYRROLATE 0.2 MG/ML IJ SOLN
INTRAMUSCULAR | Status: DC | PRN
  Administered 2019-11-30: .1 mg via INTRAVENOUS

## 2019-11-30 MED ORDER — ORAL CARE MOUTH RINSE: 15.0000 mL | Freq: Once | OROMUCOSAL | Status: DC

## 2019-11-30 MED ORDER — ONDANSETRON HCL 4 MG/2ML IJ SOLN
INTRAMUSCULAR | Status: AC
  Filled 2019-11-30: qty 2

## 2019-11-30 SURGICAL SUPPLY — 83 items
BANDAGE ESMARK 4X12 BL STRL LF (DISPOSABLE) ×2 IMPLANT
BIT DRILL 2.0X128 (BIT) ×3 IMPLANT
BIT DRILL 2.0X128MM (BIT) ×1
BIT DRILL 2.4X128 (BIT) ×3 IMPLANT
BIT DRILL 2.4X128MM (BIT) ×1
BIT DRILL 3.5X122MM AO FIT (BIT) IMPLANT
BIT DRILL CANN 2.7 (BIT) ×3
BIT DRILL CANN 2.7MM (BIT) ×1
BIT DRILL SRG 2.7XCANN AO CPLG (BIT) ×2 IMPLANT
BIT DRL SRG 2.7XCANN AO CPLNG (BIT) ×2
BLADE SURG SZ10 CARB STEEL (BLADE) ×4 IMPLANT
BNDG CMPR 12X4 ELC STRL LF (DISPOSABLE) ×2
BNDG CMPR STD VLCR NS LF 5.8X4 (GAUZE/BANDAGES/DRESSINGS) ×6
BNDG COHESIVE 4X5 TAN STRL (GAUZE/BANDAGES/DRESSINGS) ×4 IMPLANT
BNDG ELASTIC 4X5.8 VLCR NS LF (GAUZE/BANDAGES/DRESSINGS) ×12 IMPLANT
BNDG ESMARK 4X12 BLUE STRL LF (DISPOSABLE) ×4
BNDG GAUZE ELAST 4 BULKY (GAUZE/BANDAGES/DRESSINGS) ×12 IMPLANT
CLOTH BEACON ORANGE TIMEOUT ST (SAFETY) ×4 IMPLANT
COVER LIGHT HANDLE STERIS (MISCELLANEOUS) ×8 IMPLANT
COVER WAND RF STERILE (DRAPES) ×4 IMPLANT
CUFF TOURN SGL QUICK 24 (TOURNIQUET CUFF) ×4
CUFF TRNQT CYL 24X4X16.5-23 (TOURNIQUET CUFF) ×2 IMPLANT
DECANTER SPIKE VIAL GLASS SM (MISCELLANEOUS) IMPLANT
DRAPE C-ARM FOLDED MOBILE STRL (DRAPES) ×8 IMPLANT
DRAPE C-ARMOR (DRAPES) ×4 IMPLANT
DRAPE ORTHO 2.5IN SPLIT 77X108 (DRAPES) ×2 IMPLANT
DRAPE ORTHO SPLIT 77X108 STRL (DRAPES) ×4
DRILL 2.6X122MM WL AO SHAFT (BIT) ×4 IMPLANT
ELECT REM PT RETURN 9FT ADLT (ELECTROSURGICAL) ×4
ELECTRODE REM PT RTRN 9FT ADLT (ELECTROSURGICAL) ×2 IMPLANT
GAUZE SPONGE 4X4 12PLY STRL (GAUZE/BANDAGES/DRESSINGS) ×4 IMPLANT
GAUZE XEROFORM 5X9 LF (GAUZE/BANDAGES/DRESSINGS) ×4 IMPLANT
GLOVE BIOGEL PI IND STRL 7.0 (GLOVE) ×6 IMPLANT
GLOVE BIOGEL PI IND STRL 8 (GLOVE) ×2 IMPLANT
GLOVE BIOGEL PI INDICATOR 7.0 (GLOVE) ×6
GLOVE BIOGEL PI INDICATOR 8 (GLOVE) ×2
GLOVE SKINSENSE NS SZ8.0 LF (GLOVE) ×4
GLOVE SKINSENSE STRL SZ8.0 LF (GLOVE) ×4 IMPLANT
GOWN STRL REUS W/ TWL XL LVL3 (GOWN DISPOSABLE) ×2 IMPLANT
GOWN STRL REUS W/TWL LRG LVL3 (GOWN DISPOSABLE) ×12 IMPLANT
GOWN STRL REUS W/TWL XL LVL3 (GOWN DISPOSABLE) ×4
INST SET MINOR BONE (KITS) ×4 IMPLANT
K-WIRE 1.6X150 (WIRE) ×4
K-WIRE FX150X1.6XKRSH (WIRE) ×2
K-WIRE ORTHOPEDIC 1.4X150L (WIRE) ×4
K-WIRE SMOOTH 2.0X150 (WIRE)
KIT TURNOVER KIT A (KITS) ×4 IMPLANT
KWIRE FX150X1.6XKRSH (WIRE) ×2 IMPLANT
KWIRE ORTHOPEDIC 1.4X150L (WIRE) ×2 IMPLANT
KWIRE SMOOTH 2.0X150 (WIRE) IMPLANT
MANIFOLD NEPTUNE II (INSTRUMENTS) ×4 IMPLANT
NEEDLE HYPO 21X1.5 SAFETY (NEEDLE) ×4 IMPLANT
NS IRRIG 1000ML POUR BTL (IV SOLUTION) ×4 IMPLANT
PACK BASIC LIMB (CUSTOM PROCEDURE TRAY) ×4 IMPLANT
PAD ABD 5X9 TENDERSORB (GAUZE/BANDAGES/DRESSINGS) ×8 IMPLANT
PAD ARMBOARD 7.5X6 YLW CONV (MISCELLANEOUS) ×4 IMPLANT
PAD CAST 4YDX4 CTTN HI CHSV (CAST SUPPLIES) ×2 IMPLANT
PADDING CAST ABS 4INX4YD NS (CAST SUPPLIES) ×2
PADDING CAST ABS COTTON 4X4 ST (CAST SUPPLIES) ×2 IMPLANT
PADDING CAST COTTON 4X4 STRL (CAST SUPPLIES) ×4
PLATE BROAD STR 4H (Plate) ×4 IMPLANT
SCREW 60X4.0MM (Screw) ×4 IMPLANT
SCREW BONE 14MMX3.5MM (Screw) ×4 IMPLANT
SCREW BONE 3.5X16MM (Screw) ×4 IMPLANT
SCREW BONE 3.5X20MM (Screw) ×8 IMPLANT
SCREW BONE 3.5X32MM (Screw) ×4 IMPLANT
SCREW BONE 3.5X34 (Screw) ×4 IMPLANT
SCREW BONE 3.5X38 (Screw) ×4 IMPLANT
SCREW BONE 3.5X44 (Screw) ×4 IMPLANT
SCREW LOCK 20MMX3.5 (Screw) ×4 IMPLANT
SCREW LOCKING 22MM (Screw) ×4 IMPLANT
SCREW LOCKING 3.5X16MM (Screw) ×4 IMPLANT
SET BASIN LINEN APH (SET/KITS/TRAYS/PACK) ×4 IMPLANT
SPLINT PLASTER CAST XFAST 5X30 (CAST SUPPLIES) ×2 IMPLANT
SPLINT PLASTER XFAST SET 5X30 (CAST SUPPLIES) ×2
SPONGE LAP 18X18 RF (DISPOSABLE) ×4 IMPLANT
STAPLER VISISTAT 35W (STAPLE) IMPLANT
SUT ETHILON 3 0 FSL (SUTURE) ×8 IMPLANT
SUT MON AB 0 CT1 (SUTURE) IMPLANT
SUT MON AB 2-0 CT1 36 (SUTURE) ×8 IMPLANT
SYR 30ML LL (SYRINGE) ×4 IMPLANT
SYR BULB IRRIG 60ML STRL (SYRINGE) ×4 IMPLANT
WASHER V FOOT (Washer) ×4 IMPLANT

## 2019-11-30 NOTE — Anesthesia Postprocedure Evaluation (Signed)
Anesthesia Post Note  Patient: Lee Wang  Procedure(s) Performed: Operative Fixation of Right Ankle Fracture (Right Ankle) HARDWARE REMOVAL; right fibula plate and screws (Right )  Patient location during evaluation: PACU Anesthesia Type: MAC and Regional Level of consciousness: awake, oriented, awake and alert and patient cooperative Pain management: pain level controlled Vital Signs Assessment: post-procedure vital signs reviewed and stable Respiratory status: spontaneous breathing, respiratory function stable and nonlabored ventilation Cardiovascular status: blood pressure returned to baseline and stable Postop Assessment: no headache and no backache Anesthetic complications: no   No complications documented.   Last Vitals:  Vitals:   11/30/19 0920  BP: 119/68  Pulse: (!) 57  Resp: 14  Temp: 36.7 C  SpO2: 100%    Last Pain:  Vitals:   11/30/19 0920  TempSrc: Oral  PainSc: 3                  Tacy Learn

## 2019-11-30 NOTE — Op Note (Addendum)
Orthopaedic Surgery Operative Note (CSN: [id number])  Lee Wang  [birth year] Date of Surgery: 11/30/2019   Diagnoses:  Right bimalleolar ankle fracture  Procedure: ORIF right bimalleolar ankle fracture, including revision ORIF of the fibula (CPT [phone]) Removal of hardware from right fibula (CPT 20680)    Operative Finding Successful completion of the planned procedure.  Removal of pre-existing plate and screws from the fibula with revision ORIF of the fibula with a locking plate.  ORIF of the vertical shear fracture of the medial malleolus.  Overall very poor bone quality with comminution at both the distal aspect of the fibula and the most proximal extent of the medial malleolus fracture.  Post-Op Diagnosis: Same Surgeons:Primary: Mordecai Rasmussen, MD Assistants: Marquita Palms Location: [facility] OR ROOM 4 Anesthesia: General with regional anesthesia Antibiotics: Ancef 2 g Tourniquet time:  Total Tourniquet Time Documented: Thigh (Right) - 130 minutes Total: Thigh (Right) - 130 minutes Estimated Blood Loss: 30 cc  Complications: None Specimens: None Implants: Implant Name Type Inv. Item Serial No. Manufacturer Lot No. LRB No. Used Action  Plate and 5 screws   UNKNOWN  UNKNOWN Right 6 Explanted  PLATE FIBULA 4H - SNA Plate PLATE FIBULA 4H NA [employer] NA Right 1 Implanted  SCREW BONE 3.5X16MM - SNA Screw SCREW BONE 3.5X16MM NA [employer] NA Right 1 Implanted  SCREW LOCK 20MMX3.5 - SNA Screw SCREW LOCK 20MMX3.5 NA [employer] NA Right 1 Implanted  SCREW LOCKING 3.5X16MM - SNA Screw SCREW LOCKING 3.5X16MM NA [employer] NA Right 1 Implanted  V2 PLATE 4 HOLE   NA [employer] NA Right 1 Implanted  SCREW BONE 3.5X34 - SNA Screw SCREW BONE 3.5X34 NA [employer] NA Right 1 Implanted  SCREW BONE 3.5X32MM - SNA Screw SCREW BONE 3.5X32MM NA [employer] NA Right 1 Implanted  SCREW BONE 3.5X44 - SNA Screw SCREW BONE 3.5X44 NA [employer] NA Right 1 Implanted  WASHER V FOOT - SNA Washer WASHER V FOOT NA [employer] NA Right 1 Implanted  SCREW 60X4.0MM - SNA Screw SCREW 60X4.0MM NA [employer] NA Right 1 Implanted  SCREW BONE 14MMX3.5MM - SNA Screw SCREW BONE 14MMX3.5MM NA [employer] NA Right 1 Implanted    Indications for Surgery:   Lee Wang is a 74 y.o. male who sustained a bimalleolar ankle fracture dislocation approximately 2 weeks ago after a fall.  He was evaluated at that time, and deemed to be too swollen to proceed with surgery.  As a result, his right ankle was reduced under general anesthesia in the operating room.  He was placed in a splint, and watched closely.  He presented to clinic a yesterday, and at that time swelling had improved sufficiently that it was safe to proceed with definitive fixation.  Benefits and risks of operative and nonoperative management were discussed prior to surgery with patient/guardian(s) and informed consent form was completed.  Specific risks including infection, need for additional surgery, persistent pain, nonunion, malunion, bleeding and more severe complications associated with anesthesia.   Procedure:   The patient was identified properly. Informed consent was obtained and the surgical site was marked. The patient was taken up to suite where general anesthesia was induced.  The patient was positioned supine on a regular table.  The right ankle was prepped and draped in the usual sterile fashion.  Timeout was performed before the beginning of the case.  2 g of Ancef and TXA were dosed prior to incision.  The tourniquet  was inflated to 250 mmHg and used for the above duration.  Upon removal of the splint, he was noted to have multiple hemorrhagic fracture blisters on the lateral border of his foot, extending posterior to the fibula.  There is also an area anterior to his previous surgical incision where the skin was very thin, which is most likely  related to pressure on the skin from the fracture dislocation before it was reduced.  We identified the previous incision, and and plan to make an incision slightly posterior to this where the skin was in better condition.  We incised sharply through the skin in a longitudinal fashion and then used Bovie electrocautery to identify the previous plate.  We then exposed the entire plate, and all plates and screws were removed without issue.  Based on our preoperative planning, it was determined that the plate and screws would have to be removed in order to adequately reduce and stabilize his fracture reduction.  We used a rongeur, and a key elevator to remove the excess bone that had formed as well as any residual soft tissue underneath the plate.  We then proceeded to copiously irrigate the wound.  At that point, I selected a distal fibula locking plate and evaluated its positioning under fluoroscopy.  Once were satisfied with the position, this was secured in place with a K wire.  We noted that the fracture of the distal fibula was very distal, with significant comminution.  We manually reduced the ankle, using fluoroscopy, and were satisfied with the reduction.  We then attempted to place multiple screws in the distal fibula to aid in our reduction.  The screws immediately pulled out of the bone.  We then placed a single screw in the through the plate proximally to secure to the lateral aspect of the fibula.  Once again, we attempted to place a screw at the distal aspect of the plate through the comminuted portion of the distal fibula, but I achieved no purchase.  We then turned our attention to the most distal hole in the plate, and attempted to angle the screw proximally towards more secure bone.  Once again, we are unable to achieve appropriate purchase of the screw.  At this point, I elected to place 2 locking screws distally within the plate.  We evaluated the fracture under fluoroscopy, and were satisfied  with the positioning.  We then turned our attention to the medial malleolus fragment.  I made a curvilinear incision starting medial to the fracture line, extending distally beyond the distal aspect of the medial malleolus.  I paid very close attention to the saphenous vein and nerve.  These were identified and retracted bluntly.  We then exposed the fracture site with combination of Bovie electrocautery and a 15 blade.  We then identified the fracture site, and it was noted that there was a fair amount of scarring the overlying soft tissue.  This was sequentially freed up and were able to achieve an adequate reduction.  Based on the size of the medial malleolus fragment I elected to proceed with a medial buttress plate.  A 4 hole one third tubular plate was selected and placed over the fracture.  This was confirmed under fluoroscopy.  We then secured the plate with a K wire distally.  2 bicortical screws were then placed proximal to the fracture line which achieved an excellent reduction.  This was confirmed under fluoroscopy.  We then placed a single lag screw in the distal hole of  the plate.  However, upon securing this hole, due to the weak nature of his bone, the medial malleolus talus fragment split and we subsequently lost some of our reduction.  The most anterior medial portion of the medial malleolus was no longer reduced as the plate had caused an additional fracture line in the medial malleolus fragment.  Using fluoroscopy, we were able to reduce this fragment and I proceeded to place a partially-threaded cannulated screw with a washer which secured the anterior medial fragment of the medial malleolus.  Orthogonal views on fluoroscopy confirmed an adequate reduction.  We then turned our attention back to the lateral malleolus, and proceeded to place multiple screws within the plate to secure it to the lateral aspect of the fibula.  We then evaluated the stability of the mortise under fluoroscopy.  It  did not appear to be grossly unstable, however the injury films demonstrated that the mortise had been disrupted.  As a result, I plan to place a single syndesmotic screw.  We placed pressure across the syndesmosis to reduce it, and then proceeded to place a single syndesmotic screw.  We achieved excellent purchase.  Final fluoroscopic images confirmed improved alignment of the ankle, with excellent reduction of the medial and lateral malleolus fractures.  Once again, the distal aspect of the fibula was very comminuted however the fracture fragments were appropriately aligned.   We irrigated the wound copiously..  We closed the incision in a multilayer fashion with absorbable suture and running 3-0 nylon.  A sterile dressing including Xeroform over the fracture blisters, followed by gauze, ABD and sterile web roll.  A well-padded short leg splint was then placed.  Patient was awoken taken to PACU in stable condition.  Post-operative plan:  Disposition: The patient will be discharged back to a nursing facility.   Weight Bearing: Patient will be nonweightbearing on the right lower extremity for 2 to 3 months postop DVT prophylaxis: Aspirin 81 mg twice daily for 6 weeks.    Pain control: PRN pain medication preferring oral medicines.   Follow up plan: will be scheduled in approximately 10-14 days for splint removal, incision check and transition to a cast.  X-rays at next visit: Right ankle in splint

## 2019-11-30 NOTE — Anesthesia Procedure Notes (Signed)
Anesthesia Regional Block: Femoral nerve block   Pre-Anesthetic Checklist: ,, timeout performed, Correct Patient, Correct Site, Correct Laterality, Correct Procedure, Correct Position, site marked, Risks and benefits discussed,  Surgical consent,  Pre-op evaluation,  At surgeon's request and post-op pain management  Laterality: Right  Prep: chloraprep       Needles:  Injection technique: Single-shot  Needle Type: Stimiplex     Needle Length: 15cm  Needle Gauge: 22     Additional Needles:   Procedures:, nerve stimulator,,, ultrasound used (permanent image in chart),,,,  Narrative:  Start time: 11/30/2019 10:02 AM End time: 11/30/2019 10:21 AM Injection made incrementally with aspirations every 3 mL.  Performed by: With CRNAs  Anesthesiologist: Louann Sjogren, MD CRNA: Tacy Learn, CRNA  Additional Notes: 62ml (56ml Pop, 11ml Saphenous)l 0.5% marcaine plain. 24ml 2% Lido infiltration.

## 2019-11-30 NOTE — Interval H&P Note (Signed)
History and Physical Interval Note:  11/30/2019 10:33 AM  Lee Wang  has presented today for surgery, with the diagnosis of Right bimalleolar ankle fracture.  The various methods of treatment have been discussed with the patient and family. After consideration of risks, benefits and other options for treatment, the patient has consented to  Procedure(s) with comments: OPEN REDUCTION INTERNAL FIXATION (ORIF) ANKLE FRACTURE (Right) - ORIF medial mal; revision ORIF fibula HARDWARE REMOVAL; right fibula plate and screws (Right) as a surgical intervention.  The patient's history has been reviewed, patient examined, no change in status, stable for surgery.  I have reviewed the patient's chart and labs.  Questions were answered to the patient's satisfaction.     Mordecai Rasmussen

## 2019-11-30 NOTE — Anesthesia Preprocedure Evaluation (Signed)
Anesthesia Evaluation  Patient identified by MRN, date of birth, ID band Patient awake    Reviewed: Allergy & Precautions, H&P , NPO status , Patient's Chart, lab work & pertinent test results, reviewed documented beta blocker date and time   Airway Mallampati: II  TM Distance: >3 FB Neck ROM: full    Dental no notable dental hx.    Pulmonary neg pulmonary ROS, Current Smoker,    Pulmonary exam normal breath sounds clear to auscultation       Cardiovascular Exercise Tolerance: Good hypertension, negative cardio ROS   Rhythm:regular Rate:Normal     Neuro/Psych PSYCHIATRIC DISORDERS Anxiety Depression negative neurological ROS     GI/Hepatic negative GI ROS, (+)     substance abuse  alcohol use,   Endo/Other  negative endocrine ROS  Renal/GU negative Renal ROS  negative genitourinary   Musculoskeletal   Abdominal   Peds  Hematology negative hematology ROS (+)   Anesthesia Other Findings   Reproductive/Obstetrics negative OB ROS                             Anesthesia Physical Anesthesia Plan  ASA: III  Anesthesia Plan: General   Post-op Pain Management:    Induction:   PONV Risk Score and Plan: Ondansetron  Airway Management Planned:   Additional Equipment:   Intra-op Plan:   Post-operative Plan:   Informed Consent: I have reviewed the patients History and Physical, chart, labs and discussed the procedure including the risks, benefits and alternatives for the proposed anesthesia with the patient or authorized representative who has indicated his/her understanding and acceptance.     Dental Advisory Given  Plan Discussed with: CRNA  Anesthesia Plan Comments:         Anesthesia Quick Evaluation

## 2019-11-30 NOTE — Discharge Instructions (Signed)
Mark A. Amedeo [name], MD [facility] [address] [place],  [place]  [address] Phone: [phone] Fax: [phone]   [place] ? Please keep splint clean dry and intact until followup.  ? You may shower on Post-Op Day #2.  ? You must keep splint dry during this process and may find that a plastic bag taped around the leg or alternatively a towel based bath may be a better option.   ? If you get your splint wet or if it is damaged please contact our clinic.  EXERCISES ? Due to your splint being in place you will not be able to bear weight through your extremity.   ? DO NOT PUT ANY WEIGHT ON YOUR OPERATIVE LEG ? Please use crutches or a walker to avoid weight bearing.   REGIONAL ANESTHESIA (NERVE BLOCKS) . The anesthesia team may have performed a nerve block for you if safe in the setting of your care.  This is a great tool used to minimize pain.  Typically the block may start wearing off overnight but the long acting medicine may last for 3-4 days.  The nerve block wearing off can be a challenging period but please utilize your as needed pain medications to try and manage this period.    POST-OP MEDICATIONS- Multimodal approach to pain control . In general your pain will be controlled with a combination of substances.  Prescriptions unless otherwise discussed are electronically sent to your pharmacy.  This is a carefully made plan we use to minimize narcotic use.     - Acetaminophen - Non-narcotic pain medicine taken on a scheduled basis   - Oxycodone - This is a strong narcotic, to be used only on an "as needed" basis for pain.  -  Aspirin 81mg  - This medicine is used to minimize the risk of blood clots after surgery.             -          Zofran - take as needed for nausea   -  Gabapentin for 5 days to prevent nerve pain related to the nerve block  FOLLOW-UP ? If you develop a Fever (>101.5), Redness or  Drainage from the surgical incision site, please call our office to arrange for an evaluation. ? Please call the office to schedule a follow-up appointment for your incision check if you do not already have one, 10-14 days post-operatively.  IF YOU HAVE ANY QUESTIONS, PLEASE FEEL FREE TO CALL OUR OFFICE.  HELPFUL INFORMATION  ? If you had a block, it will wear off between 8-24 hrs postop typically.  This is period when your pain may go from nearly zero to the pain you would have had postop without the block.  This is an abrupt transition but nothing dangerous is happening.  You may take an extra dose of narcotic when this happens.  ? You should wean off your narcotic medicines as soon as you are able.  Most patients will be off or using minimal narcotics before their first postop appointment.   ? Elevating your leg will help with swelling and pain control.  You are encouraged to elevate your leg as much as possible in the first couple of weeks following surgery.  Imagine a drop of water on your toe, and your goal is to get that water back to your heart.  ? We suggest you use the pain medication the first  night prior to going to bed, in order to ease any pain when the anesthesia wears off. You should avoid taking pain medications on an empty stomach as it will make you nauseous.  ? Do not drink alcoholic beverages or take illicit drugs when taking pain medications.  ? In most states it is against the law to drive while you are in a splint or sling.  And certainly against the law to drive while taking narcotics.  ? You may return to work/school in the next couple of days when you feel up to it.   ? Pain medication may make you constipated.  Below are a few solutions to try in this order: - Decrease the amount of pain medication if you aren't having pain. - Drink lots of decaffeinated fluids. - Drink prune juice and/or each dried prunes  o If the first 3 don't work start with additional  solutions - Take Colace - an over-the-counter stool softener - Take Senokot - an over-the-counter laxative - Take Miralax - a stronger over-the-counter laxative

## 2019-11-30 NOTE — Transfer of Care (Signed)
Immediate Anesthesia Transfer of Care Note  Patient: Lee Wang  Procedure(s) Performed: Operative Fixation of Right Ankle Fracture (Right Ankle) HARDWARE REMOVAL; right fibula plate and screws (Right )  Patient Location: PACU  Anesthesia Type:MAC and Regional  Level of Consciousness: awake, alert  and patient cooperative  Airway & Oxygen Therapy: Patient Spontanous Breathing  Post-op Assessment: Report given to RN, Post -op Vital signs reviewed and stable and Patient moving all extremities  Post vital signs: Reviewed and stable  Last Vitals:  Vitals Value Taken Time  BP 102/50 11/30/19 1352  Temp    Pulse 69 11/30/19 1353  Resp 12 11/30/19 1353  SpO2 93 % 11/30/19 1353  Vitals shown include unvalidated device data.  Last Pain:  Vitals:   11/30/19 0920  TempSrc: Oral  PainSc: 3       Patients Stated Pain Goal: 5 (48/88/91 [id number])  Complications: No complications documented.

## 2019-12-01 ENCOUNTER — Encounter (HOSPITAL_COMMUNITY): Payer: Self-pay | Admitting: Orthopedic Surgery

## 2019-12-13 ENCOUNTER — Telehealth: Payer: Self-pay | Admitting: Orthopedic Surgery

## 2019-12-13 ENCOUNTER — Ambulatory Visit: Payer: Medicare Other

## 2019-12-13 ENCOUNTER — Encounter: Payer: Self-pay | Admitting: Orthopedic Surgery

## 2019-12-13 ENCOUNTER — Other Ambulatory Visit: Payer: Self-pay

## 2019-12-13 ENCOUNTER — Encounter (HOSPITAL_COMMUNITY): Payer: Self-pay | Admitting: Orthopedic Surgery

## 2019-12-13 ENCOUNTER — Other Ambulatory Visit: Payer: Self-pay | Admitting: Orthopedic Surgery

## 2019-12-13 ENCOUNTER — Ambulatory Visit (INDEPENDENT_AMBULATORY_CARE_PROVIDER_SITE_OTHER): Payer: Medicare Other | Admitting: Orthopedic Surgery

## 2019-12-13 ENCOUNTER — Inpatient Hospital Stay (HOSPITAL_COMMUNITY)
Admission: AD | Admit: 2019-12-13 | Discharge: 2019-12-26 | DRG: 492 | Disposition: A | Discharge status: Skilled Nursing Facility | Payer: Medicare Other | Source: Ambulatory Visit | Attending: Internal Medicine | Admitting: Internal Medicine

## 2019-12-13 VITALS — BP 114/62 | HR 57

## 2019-12-13 DIAGNOSIS — I10 Essential (primary) hypertension: Secondary | ICD-10-CM | POA: Diagnosis present

## 2019-12-13 DIAGNOSIS — S82851A Displaced trimalleolar fracture of right lower leg, initial encounter for closed fracture: Secondary | ICD-10-CM | POA: Diagnosis present

## 2019-12-13 DIAGNOSIS — Z820 Family history of epilepsy and other diseases of the nervous system: Secondary | ICD-10-CM | POA: Diagnosis not present

## 2019-12-13 DIAGNOSIS — M2550 Pain in unspecified joint: Secondary | ICD-10-CM | POA: Diagnosis not present

## 2019-12-13 DIAGNOSIS — C61 Malignant neoplasm of prostate: Secondary | ICD-10-CM | POA: Diagnosis not present

## 2019-12-13 DIAGNOSIS — Z4781 Encounter for orthopedic aftercare following surgical amputation: Secondary | ICD-10-CM | POA: Diagnosis not present

## 2019-12-13 DIAGNOSIS — N400 Enlarged prostate without lower urinary tract symptoms: Secondary | ICD-10-CM | POA: Diagnosis present

## 2019-12-13 DIAGNOSIS — R269 Unspecified abnormalities of gait and mobility: Secondary | ICD-10-CM | POA: Diagnosis present

## 2019-12-13 DIAGNOSIS — N179 Acute kidney failure, unspecified: Secondary | ICD-10-CM | POA: Diagnosis not present

## 2019-12-13 DIAGNOSIS — M1990 Unspecified osteoarthritis, unspecified site: Secondary | ICD-10-CM | POA: Diagnosis not present

## 2019-12-13 DIAGNOSIS — S82891A Other fracture of right lower leg, initial encounter for closed fracture: Secondary | ICD-10-CM

## 2019-12-13 DIAGNOSIS — S9306XA Dislocation of unspecified ankle joint, initial encounter: Secondary | ICD-10-CM | POA: Diagnosis present

## 2019-12-13 DIAGNOSIS — T8092XA Unspecified transfusion reaction, initial encounter: Secondary | ICD-10-CM | POA: Diagnosis not present

## 2019-12-13 DIAGNOSIS — M9669 Fracture of other bone following insertion of orthopedic implant, joint prosthesis, or bone plate: Secondary | ICD-10-CM | POA: Diagnosis present

## 2019-12-13 DIAGNOSIS — E876 Hypokalemia: Secondary | ICD-10-CM | POA: Diagnosis not present

## 2019-12-13 DIAGNOSIS — T84028A Dislocation of other internal joint prosthesis, initial encounter: Secondary | ICD-10-CM | POA: Diagnosis not present

## 2019-12-13 DIAGNOSIS — K7460 Unspecified cirrhosis of liver: Secondary | ICD-10-CM | POA: Diagnosis not present

## 2019-12-13 DIAGNOSIS — Z720 Tobacco use: Secondary | ICD-10-CM | POA: Diagnosis present

## 2019-12-13 DIAGNOSIS — G8918 Other acute postprocedural pain: Secondary | ICD-10-CM | POA: Diagnosis not present

## 2019-12-13 DIAGNOSIS — Z8546 Personal history of malignant neoplasm of prostate: Secondary | ICD-10-CM | POA: Diagnosis not present

## 2019-12-13 DIAGNOSIS — W109XXS Fall (on) (from) unspecified stairs and steps, sequela: Secondary | ICD-10-CM | POA: Diagnosis present

## 2019-12-13 DIAGNOSIS — D62 Acute posthemorrhagic anemia: Secondary | ICD-10-CM | POA: Diagnosis not present

## 2019-12-13 DIAGNOSIS — R001 Bradycardia, unspecified: Secondary | ICD-10-CM | POA: Diagnosis not present

## 2019-12-13 DIAGNOSIS — B974 Respiratory syncytial virus as the cause of diseases classified elsewhere: Secondary | ICD-10-CM | POA: Diagnosis not present

## 2019-12-13 DIAGNOSIS — S82891B Other fracture of right lower leg, initial encounter for open fracture type I or II: Secondary | ICD-10-CM | POA: Diagnosis present

## 2019-12-13 DIAGNOSIS — T8132XA Disruption of internal operation (surgical) wound, not elsewhere classified, initial encounter: Secondary | ICD-10-CM | POA: Diagnosis not present

## 2019-12-13 DIAGNOSIS — Z20822 Contact with and (suspected) exposure to covid-19: Secondary | ICD-10-CM | POA: Diagnosis present

## 2019-12-13 DIAGNOSIS — J99 Respiratory disorders in diseases classified elsewhere: Secondary | ICD-10-CM | POA: Diagnosis not present

## 2019-12-13 DIAGNOSIS — Z471 Aftercare following joint replacement surgery: Secondary | ICD-10-CM | POA: Diagnosis not present

## 2019-12-13 DIAGNOSIS — F32A Depression, unspecified: Secondary | ICD-10-CM | POA: Diagnosis not present

## 2019-12-13 DIAGNOSIS — K644 Residual hemorrhoidal skin tags: Secondary | ICD-10-CM | POA: Diagnosis present

## 2019-12-13 DIAGNOSIS — D509 Iron deficiency anemia, unspecified: Secondary | ICD-10-CM | POA: Diagnosis present

## 2019-12-13 DIAGNOSIS — Z743 Need for continuous supervision: Secondary | ICD-10-CM | POA: Diagnosis not present

## 2019-12-13 DIAGNOSIS — S82891S Other fracture of right lower leg, sequela: Principal | ICD-10-CM

## 2019-12-13 DIAGNOSIS — R54 Age-related physical debility: Secondary | ICD-10-CM | POA: Diagnosis present

## 2019-12-13 DIAGNOSIS — S2232XD Fracture of one rib, left side, subsequent encounter for fracture with routine healing: Secondary | ICD-10-CM | POA: Diagnosis not present

## 2019-12-13 DIAGNOSIS — E785 Hyperlipidemia, unspecified: Secondary | ICD-10-CM | POA: Diagnosis present

## 2019-12-13 DIAGNOSIS — S92351B Displaced fracture of fifth metatarsal bone, right foot, initial encounter for open fracture: Secondary | ICD-10-CM | POA: Diagnosis not present

## 2019-12-13 DIAGNOSIS — F17210 Nicotine dependence, cigarettes, uncomplicated: Secondary | ICD-10-CM | POA: Diagnosis present

## 2019-12-13 DIAGNOSIS — F1020 Alcohol dependence, uncomplicated: Secondary | ICD-10-CM | POA: Diagnosis present

## 2019-12-13 DIAGNOSIS — G9341 Metabolic encephalopathy: Secondary | ICD-10-CM | POA: Diagnosis not present

## 2019-12-13 DIAGNOSIS — R579 Shock, unspecified: Secondary | ICD-10-CM

## 2019-12-13 DIAGNOSIS — S82851S Displaced trimalleolar fracture of right lower leg, sequela: Secondary | ICD-10-CM | POA: Diagnosis not present

## 2019-12-13 DIAGNOSIS — Z4789 Encounter for other orthopedic aftercare: Secondary | ICD-10-CM | POA: Diagnosis not present

## 2019-12-13 DIAGNOSIS — I9581 Postprocedural hypotension: Secondary | ICD-10-CM | POA: Diagnosis not present

## 2019-12-13 DIAGNOSIS — I9589 Other hypotension: Secondary | ICD-10-CM | POA: Diagnosis not present

## 2019-12-13 DIAGNOSIS — F1010 Alcohol abuse, uncomplicated: Secondary | ICD-10-CM | POA: Diagnosis present

## 2019-12-13 DIAGNOSIS — S92301G Fracture of unspecified metatarsal bone(s), right foot, subsequent encounter for fracture with delayed healing: Secondary | ICD-10-CM | POA: Diagnosis not present

## 2019-12-13 DIAGNOSIS — T8131XA Disruption of external operation (surgical) wound, not elsewhere classified, initial encounter: Secondary | ICD-10-CM | POA: Diagnosis not present

## 2019-12-13 DIAGNOSIS — K7030 Alcoholic cirrhosis of liver without ascites: Secondary | ICD-10-CM | POA: Diagnosis not present

## 2019-12-13 DIAGNOSIS — E46 Unspecified protein-calorie malnutrition: Secondary | ICD-10-CM | POA: Diagnosis not present

## 2019-12-13 DIAGNOSIS — Z9119 Patient's noncompliance with other medical treatment and regimen: Secondary | ICD-10-CM

## 2019-12-13 DIAGNOSIS — F418 Other specified anxiety disorders: Secondary | ICD-10-CM | POA: Diagnosis not present

## 2019-12-13 DIAGNOSIS — E861 Hypovolemia: Secondary | ICD-10-CM | POA: Diagnosis not present

## 2019-12-13 DIAGNOSIS — T8131XD Disruption of external operation (surgical) wound, not elsewhere classified, subsequent encounter: Secondary | ICD-10-CM | POA: Diagnosis not present

## 2019-12-13 DIAGNOSIS — I469 Cardiac arrest, cause unspecified: Secondary | ICD-10-CM | POA: Diagnosis not present

## 2019-12-13 DIAGNOSIS — E871 Hypo-osmolality and hyponatremia: Secondary | ICD-10-CM | POA: Diagnosis present

## 2019-12-13 DIAGNOSIS — S82891G Other fracture of right lower leg, subsequent encounter for closed fracture with delayed healing: Secondary | ICD-10-CM | POA: Diagnosis not present

## 2019-12-13 DIAGNOSIS — Z8719 Personal history of other diseases of the digestive system: Secondary | ICD-10-CM

## 2019-12-13 DIAGNOSIS — D1800 Hemangioma unspecified site: Secondary | ICD-10-CM | POA: Diagnosis not present

## 2019-12-13 DIAGNOSIS — S82891D Other fracture of right lower leg, subsequent encounter for closed fracture with routine healing: Secondary | ICD-10-CM | POA: Diagnosis not present

## 2019-12-13 DIAGNOSIS — T8119XA Other postprocedural shock, initial encounter: Secondary | ICD-10-CM | POA: Diagnosis not present

## 2019-12-13 DIAGNOSIS — Z7401 Bed confinement status: Secondary | ICD-10-CM | POA: Diagnosis not present

## 2019-12-13 DIAGNOSIS — F419 Anxiety disorder, unspecified: Secondary | ICD-10-CM | POA: Diagnosis present

## 2019-12-13 DIAGNOSIS — Z789 Other specified health status: Secondary | ICD-10-CM

## 2019-12-13 DIAGNOSIS — T8133XA Disruption of traumatic injury wound repair, initial encounter: Secondary | ICD-10-CM | POA: Diagnosis not present

## 2019-12-13 DIAGNOSIS — T84498A Other mechanical complication of other internal orthopedic devices, implants and grafts, initial encounter: Secondary | ICD-10-CM

## 2019-12-13 DIAGNOSIS — T84116A Breakdown (mechanical) of internal fixation device of bone of right lower leg, initial encounter: Secondary | ICD-10-CM | POA: Diagnosis not present

## 2019-12-13 DIAGNOSIS — L299 Pruritus, unspecified: Secondary | ICD-10-CM | POA: Diagnosis not present

## 2019-12-13 DIAGNOSIS — Z8249 Family history of ischemic heart disease and other diseases of the circulatory system: Secondary | ICD-10-CM

## 2019-12-13 DIAGNOSIS — R262 Difficulty in walking, not elsewhere classified: Secondary | ICD-10-CM | POA: Diagnosis not present

## 2019-12-13 DIAGNOSIS — K828 Other specified diseases of gallbladder: Secondary | ICD-10-CM | POA: Diagnosis not present

## 2019-12-13 DIAGNOSIS — R404 Transient alteration of awareness: Secondary | ICD-10-CM | POA: Diagnosis not present

## 2019-12-13 DIAGNOSIS — M6281 Muscle weakness (generalized): Secondary | ICD-10-CM | POA: Diagnosis not present

## 2019-12-13 DIAGNOSIS — T8092XD Unspecified transfusion reaction, subsequent encounter: Secondary | ICD-10-CM | POA: Diagnosis not present

## 2019-12-13 DIAGNOSIS — T84498S Other mechanical complication of other internal orthopedic devices, implants and grafts, sequela: Secondary | ICD-10-CM | POA: Diagnosis not present

## 2019-12-13 DIAGNOSIS — T84126A Displacement of internal fixation device of bone of right lower leg, initial encounter: Secondary | ICD-10-CM | POA: Diagnosis not present

## 2019-12-13 LAB — RESP PANEL BY RT PCR (RSV, FLU A&B, COVID)
Influenza A by PCR: NEGATIVE
Influenza B by PCR: NEGATIVE
Respiratory Syncytial Virus by PCR: POSITIVE — AB
SARS Coronavirus 2 by RT PCR: NEGATIVE

## 2019-12-13 LAB — CBC
HCT: 33.4 % — ABNORMAL LOW (ref 39.0–52.0)
Hemoglobin: 11.2 g/dL — ABNORMAL LOW (ref 13.0–17.0)
MCH: 32.5 pg (ref 26.0–34.0)
MCHC: 33.5 g/dL (ref 30.0–36.0)
MCV: 96.8 fL (ref 80.0–100.0)
Platelets: 323 10*3/uL (ref 150–400)
RBC: 3.45 MIL/uL — ABNORMAL LOW (ref 4.22–5.81)
RDW: 12.2 % (ref 11.5–15.5)
WBC: 10.3 10*3/uL (ref 4.0–10.5)
nRBC: 0 % (ref 0.0–0.2)

## 2019-12-13 LAB — RESPIRATORY PANEL BY RT PCR (FLU A&B, COVID)
Influenza A by PCR: NEGATIVE
Influenza B by PCR: NEGATIVE
SARS Coronavirus 2 by RT PCR: NEGATIVE

## 2019-12-13 LAB — OSMOLALITY: Osmolality: 265 mOsm/kg — ABNORMAL LOW (ref 275–295)

## 2019-12-13 LAB — OSMOLALITY, URINE: Osmolality, Ur: 409 mOsm/kg (ref 300–900)

## 2019-12-13 LAB — SODIUM, URINE, RANDOM: Sodium, Ur: 27 mmol/L

## 2019-12-13 LAB — BASIC METABOLIC PANEL
Anion gap: 8 (ref 5–15)
BUN: 12 mg/dL (ref 8–23)
CO2: 25 mmol/L (ref 22–32)
Calcium: 8.5 mg/dL — ABNORMAL LOW (ref 8.9–10.3)
Chloride: 91 mmol/L — ABNORMAL LOW (ref 98–111)
Creatinine, Ser: 0.65 mg/dL (ref 0.61–1.24)
GFR, Estimated: >60 mL/min (ref >60)
Glucose, Bld: 94 mg/dL (ref 70–99)
Potassium: 4 mmol/L (ref 3.5–5.1)
Sodium: 124 mmol/L — ABNORMAL LOW (ref 135–145)

## 2019-12-13 LAB — ETHANOL: Alcohol, Ethyl (B): <10 mg/dL (ref <10)

## 2019-12-13 LAB — SURGICAL PCR SCREEN
MRSA, PCR: NEGATIVE
Staphylococcus aureus: NEGATIVE

## 2019-12-13 MED ORDER — LORAZEPAM 1 MG PO TABS: 1.0000 mg | ORAL_TABLET | ORAL | Status: DC | PRN

## 2019-12-13 MED ORDER — ATORVASTATIN CALCIUM 40 MG PO TABS
40.0000 mg | ORAL_TABLET | Freq: Every day | ORAL | Status: DC
  Administered 2019-12-13 – 2019-12-26 (×13): 40 mg via ORAL
  Filled 2019-12-13 (×13): qty 1

## 2019-12-13 MED ORDER — ACETAMINOPHEN 500 MG PO TABS
1000.0000 mg | ORAL_TABLET | Freq: Three times a day (TID) | ORAL | Status: DC
  Administered 2019-12-13 (×2): 1000 mg via ORAL
  Filled 2019-12-13 (×3): qty 2

## 2019-12-13 MED ORDER — CEFAZOLIN SODIUM-DEXTROSE 2-4 GM/100ML-% IV SOLN
2.0000 g | INTRAVENOUS | Status: AC
  Administered 2019-12-14: 2 g via INTRAVENOUS
  Filled 2019-12-13: qty 100

## 2019-12-13 MED ORDER — FOLIC ACID 1 MG PO TABS
1.0000 mg | ORAL_TABLET | Freq: Every day | ORAL | Status: DC
  Administered 2019-12-13 – 2019-12-26 (×13): 1 mg via ORAL
  Filled 2019-12-13 (×13): qty 1

## 2019-12-13 MED ORDER — CEFAZOLIN SODIUM-DEXTROSE 2-4 GM/100ML-% IV SOLN
2.0000 g | Freq: Three times a day (TID) | INTRAVENOUS | Status: AC
  Administered 2019-12-13 – 2019-12-14 (×3): 2 g via INTRAVENOUS
  Filled 2019-12-13 (×3): qty 100

## 2019-12-13 MED ORDER — LORAZEPAM 2 MG/ML IJ SOLN: 1.0000 mg | INTRAMUSCULAR | Status: DC | PRN

## 2019-12-13 MED ORDER — ONDANSETRON HCL 4 MG PO TABS: 4.0000 mg | ORAL_TABLET | Freq: Four times a day (QID) | ORAL | Status: DC | PRN

## 2019-12-13 MED ORDER — ONDANSETRON HCL 4 MG/2ML IJ SOLN: 4.0000 mg | Freq: Four times a day (QID) | INTRAMUSCULAR | Status: DC | PRN

## 2019-12-13 MED ORDER — OXYCODONE HCL 5 MG PO TABS: 5.0000 mg | ORAL_TABLET | ORAL | Status: DC | PRN

## 2019-12-13 MED ORDER — THIAMINE HCL 100 MG PO TABS
100.0000 mg | ORAL_TABLET | Freq: Every day | ORAL | Status: DC
  Administered 2019-12-13 – 2019-12-26 (×13): 100 mg via ORAL
  Filled 2019-12-13 (×13): qty 1

## 2019-12-13 MED ORDER — THIAMINE HCL 100 MG/ML IJ SOLN
100.0000 mg | Freq: Every day | INTRAMUSCULAR | Status: DC
  Filled 2019-12-13: qty 2

## 2019-12-13 MED ORDER — SODIUM CHLORIDE 0.9 % IV SOLN: INTRAVENOUS | Status: DC

## 2019-12-13 MED ORDER — ADULT MULTIVITAMIN W/MINERALS CH
1.0000 | ORAL_TABLET | Freq: Every day | ORAL | Status: DC
  Administered 2019-12-13 – 2019-12-26 (×13): 1 via ORAL
  Filled 2019-12-13 (×13): qty 1

## 2019-12-13 NOTE — Telephone Encounter (Signed)
I called and spoke with Lee Wang who was the charge nurse for [name] and let her know that the patient was currently being admitted to the hospital at this time. She is aware he would not be back for a couple of days.   [phone] is the number to call.

## 2019-12-13 NOTE — H&P (Signed)
Patient evaluated in clinic today, please refer to clinic note from 12/13/2019 for full H&P   Lee Wang A. Lee [name], MD [facility] [address] [place],  [place]  [address] Phone: [phone] Fax: [phone]

## 2019-12-13 NOTE — Progress Notes (Signed)
Orthopaedic Postop Note  Assessment: Lee Wang is a 74 y.o. male s/p ORIF right ankle fracture dislocation  DOS: 11/30/19  Plan: Lee Wang right ankle fixation has failed.  The fibular plate is clearly visible and he has an obvious deformity.  Radiographs demonstrate loss of fixation.  This will need to be revised.  We will have him admitted to the hospital and plan for surgery tomorrow.  Will request medical consultation as they are familiar with the patient.  Plan will be to remove all of the hardware, I&D and place him in an external fixator.  His ankle was placed back in a splint.   No orders of the defined types were placed in this encounter.   Follow-up: No follow-ups on file.   XR at next visit: Right ankle  Subjective:  Chief Complaint  Patient presents with  . Ankle Pain    2 week follow up    History of Present Illness: Lee Wang is a 74 y.o. male who presents following the above stated procedure.  He was discharged to a nursing facility.  His pain has been well controlled.  It is unclear whether he has attempted to ambulate in his splint.   Review of Systems: No fevers or chills No numbness or tingling No Chest Pain No shortness of breath   Objective: BP 114/62   Pulse (!) 57   Physical Exam:  Right ankle deformity.  Fibula plate is exposed and the ankle is in varus.  No active drainage.  No concerns for infection at this time.  He continues to have ecchymosis.  Fracture blisters are healing.   IMAGING: I personally ordered and reviewed the following images:  XR of the right ankle demonstrates loss of fixation of both the medial and lateral malleoli.  Medial plate and screws has pulled out.  Ankle is similar to injury films.   Impression: failure of previous ORIF, both medial and lateral malleoli  Mordecai Rasmussen, MD 12/13/2019 9:34 AM

## 2019-12-13 NOTE — Consult Note (Addendum)
Medical Consultation   DQUAN CORTOPASSI  HQP:[id number]  DOB: 1945-06-07  DOA: 12/13/2019  PCP: Patient, No Pcp Per   Outpatient Specialists: Orthopedics- Dr. Amedeo [name]  Requesting physician: Orthopedist- Dr. Amedeo [name]  Reason for consultation: Medical management  History of Present Illness: Lee Wang is an 74 y.o. male with history of alcohol abuse, hypertension, depression, BPH prostate cancer.  Patient was admitted to the hospital 10/10-10/13 for avulsion fracture of the metatarsal bone of the right foot.  Patient had closed reduction 11/20/2019, subsequently 10/21 patient had revision ORIF of the fibula and removal of hardware from right fibula.  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 Today patient followed up with his orthopedist, patient's right ankle fixation had failed, plan was for patient to be admitted and for surgery tomorrow.  Hospitalist was consulted for medical management.  On my evaluation, patient has no complaints apart from pain in his right lower extremity.  He denies vomiting, no loose stools, reports good oral intake.  He tells me he drinks a lot of water. Patient is from a nursing home.  Review of Systems:  ROS As per HPI, otherwise review of system is negative.  Past Medical History: Past Medical History:  Diagnosis Date  . Anxiety   . Arthritis   . Cancer [facility]) prostate   2013  . Depression   . Heavy drinker of alcohol   . Hyperlipidemia   . Hypertension     Past Surgical History: Past Surgical History:  Procedure Laterality Date  . ANKLE CLOSED REDUCTION Right 11/20/2019   Procedure: CLOSED REDUCTION ANKLE;  Surgeon: Mordecai Rasmussen, MD;  Location: [facility];  Service: Orthopedics;  Laterality: Right;  . CATARACT EXTRACTION W/PHACO Right 08/21/2013   Procedure: CATARACT EXTRACTION PHACO AND INTRAOCULAR LENS PLACEMENT (IOC);  Surgeon: Tonny Branch, MD;  Location:  [facility];  Service: Ophthalmology;  Laterality: Right;  CDE 7.34  . CATARACT EXTRACTION W/PHACO Left 09/14/2013   Procedure: CATARACT EXTRACTION PHACO AND INTRAOCULAR LENS PLACEMENT (IOC);  Surgeon: Tonny Branch, MD;  Location: [facility];  Service: Ophthalmology;  Laterality: Left;  CDE: 11.50  . COLONOSCOPY  2011   Recc repeat 3 years external / anal canal hemorrhoids, otherwise normal rectum. 2. multiple colonic polyps with largest ulcerated pedunculated polyp in the midsigmoid, status post multiple  hot snare polypectomies. no evidence of colitis or diverticuliosis.   Marland Kitchen FRACTURE SURGERY Bilateral    ankle  . HARDWARE REMOVAL Right 11/30/2019   Procedure: HARDWARE REMOVAL; right fibula plate and screws;  Surgeon: Mordecai Rasmussen, MD;  Location: [facility];  Service: Orthopedics;  Laterality: Right;  . ORIF ANKLE FRACTURE Right 11/30/2019   Procedure: Operative Fixation of Right Ankle Fracture;  Surgeon: Mordecai Rasmussen, MD;  Location: [facility];  Service: Orthopedics;  Laterality: Right;  ORIF medial mal; revision ORIF fibula  . ORIF HUMERUS FRACTURE Left 10/14/2016   Procedure: OPEN REDUCTION INTERNAL FIXATION (ORIF) LEFT SHOULDER FRACTURE/DISLOCATION, OPEN REDUCTION INTERNAL FIXATION GREATER TUBEROSITY;  Surgeon: Marybelle Killings, MD;  Location: [place];  Service: Orthopedics;  Laterality: Left;  . PROSTATE SURGERY     radiation implants     Allergies:  No Known Allergies   Social History:  reports that he has been smoking cigarettes. He started smoking about 61 years ago. He has a 50.00 pack-year smoking history. His smokeless tobacco use  includes chew. He reports current alcohol use of about 10.0 standard drinks of alcohol per week. He reports that he does not use drugs.   Family History: Family History  Problem Relation Age of Onset  . Alzheimer's disease Mother   . Heart disease Father   . Alzheimer's disease Paternal Grandfather   . Colon cancer Neg Hx   . Liver disease Neg Hx    Physical  Exam: Vitals:   12/13/19 1302 12/13/19 1431  BP: 127/70 (!) 119/59  Pulse: (!) 56 (!) 58  Resp: 16 16  Temp: 97.9 F (36.6 C) 97.9 F (36.6 C)  TempSrc: Oral Oral  SpO2: 100% 99%  Weight: 56.2 kg   Height: 5\' 7"  (1.702 m)     Constitutional:   Alert and awake, oriented x3, not in any acute distress. Eyes: PERLA, EOMI, irises appear normal, anicteric sclera,  ENMT: external ears and nose appear normal,            Lips appears normal, oral mucosa moist, oropharynx mucosa, tongue, posterior pharynx appear normal  Neck: neck appears normal, no masses, normal ROM, no thyromegaly, no JVD  CVS: S1-S2 clear, no murmur rubs or gallops, no LE edema, normal pedal pulses  Respiratory:  clear to auscultation bilaterally, no wheezing, rales or rhonchi. Respiratory effort normal. No accessory muscle use.  Abdomen: soft nontender, nondistended, normal bowel sounds, no hepatosplenomegaly, no hernias  Musculoskeletal: : no cyanosis, clubbing or edema noted bilaterally, left lower extremity dressing clean          Neuro: No apparent cranial normality, moving extremities spontaneously. Psych: judgement and insight appear normal, stable mood and affect, mental status Skin: no rashes or lesions or ulcers, no induration or nodules   Data reviewed:  I have personally reviewed following labs and imaging studies Labs:  CBC: Recent Labs  Lab 12/13/19 1341  WBC 10.3  HGB 11.2*  HCT 33.4*  MCV 96.8  PLT 825    Basic Metabolic Panel: Recent Labs  Lab 12/13/19 1341  NA 124*  K 4.0  CL 91*  CO2 25  GLUCOSE 94  BUN 12  CREATININE 0.65  CALCIUM 8.5*    Microbiology Recent Results (from the past 240 hour(s))  Respiratory Panel by RT PCR (Flu A&B, Covid) - Nasopharyngeal Swab     Status: None   Collection Time: 12/13/19  1:15 PM   Specimen: Nasopharyngeal Swab  Result Value Ref Range Status   SARS Coronavirus 2 by RT PCR NEGATIVE NEGATIVE Final    Comment: (NOTE) SARS-CoV-2 target nucleic  acids are NOT DETECTED.  The SARS-CoV-2 RNA is generally detectable in upper respiratoy specimens during the acute phase of infection. The lowest concentration of SARS-CoV-2 viral copies this assay can detect is 131 copies/mL. A negative result does not preclude SARS-Cov-2 infection and should not be used as the sole basis for treatment or other patient management decisions. A negative result may occur with  improper specimen collection/handling, submission of specimen other than nasopharyngeal swab, presence of viral mutation(s) within the areas targeted by this assay, and inadequate number of viral copies (<131 copies/mL). A negative result must be combined with clinical observations, patient history, and epidemiological information. The expected result is Negative.  Fact Sheet for Patients:  PinkCheek.be  Fact Sheet for Healthcare Providers:  GravelBags.it  This test is no t yet approved or cleared by the [place] FDA and  has been authorized for detection and/or diagnosis of SARS-CoV-2 by FDA under an Emergency Use Authorization (  EUA). This EUA will remain  in effect (meaning this test can be used) for the duration of the COVID-19 declaration under Section 564(b)(1) of the Act, 21 U.S.C. section 360bbb-3(b)(1), unless the authorization is terminated or revoked sooner.     Influenza A by PCR NEGATIVE NEGATIVE Final   Influenza B by PCR NEGATIVE NEGATIVE Final    Comment: (NOTE) The Xpert Xpress SARS-CoV-2/FLU/RSV assay is intended as an aid in  the diagnosis of influenza from Nasopharyngeal swab specimens and  should not be used as a sole basis for treatment. Nasal washings and  aspirates are unacceptable for Xpert Xpress SARS-CoV-2/FLU/RSV  testing.  Fact Sheet for Patients: PinkCheek.be  Fact Sheet for Healthcare Providers: GravelBags.it  This test  is not yet approved or cleared by the [place] FDA and  has been authorized for detection and/or diagnosis of SARS-CoV-2 by  FDA under an Emergency Use Authorization (EUA). This EUA will remain  in effect (meaning this test can be used) for the duration of the  Covid-19 declaration under Section 564(b)(1) of the Act, 21  U.S.C. section 360bbb-3(b)(1), unless the authorization is  terminated or revoked. Performed at [facility], [address], [place], [place] [address]   Resp Panel by RT PCR (RSV, Flu A&B, Covid) -     Status: Abnormal   Collection Time: 12/13/19  1:15 PM  Result Value Ref Range Status   SARS Coronavirus 2 by RT PCR NEGATIVE NEGATIVE Final    Comment: (NOTE) SARS-CoV-2 target nucleic acids are NOT DETECTED.  The SARS-CoV-2 RNA is generally detectable in upper respiratoy specimens during the acute phase of infection. The lowest concentration of SARS-CoV-2 viral copies this assay can detect is 131 copies/mL. A negative result does not preclude SARS-Cov-2 infection and should not be used as the sole basis for treatment or other patient management decisions. A negative result may occur with  improper specimen collection/handling, submission of specimen other than nasopharyngeal swab, presence of viral mutation(s) within the areas targeted by this assay, and inadequate number of viral copies (<131 copies/mL). A negative result must be combined with clinical observations, patient history, and epidemiological information. The expected result is Negative.  Fact Sheet for Patients:  PinkCheek.be  Fact Sheet for Healthcare Providers:  GravelBags.it  This test is no t yet approved or cleared by the [place] FDA and  has been authorized for detection and/or diagnosis of SARS-CoV-2 by FDA under an Emergency Use Authorization (EUA). This EUA will remain  in effect (meaning this test can be used) for the  duration of the COVID-19 declaration under Section 564(b)(1) of the Act, 21 U.S.C. section 360bbb-3(b)(1), unless the authorization is terminated or revoked sooner.     Influenza A by PCR NEGATIVE NEGATIVE Final   Influenza B by PCR NEGATIVE NEGATIVE Final    Comment: (NOTE) The Xpert Xpress SARS-CoV-2/FLU/RSV assay is intended as an aid in  the diagnosis of influenza from Nasopharyngeal swab specimens and  should not be used as a sole basis for treatment. Nasal washings and  aspirates are unacceptable for Xpert Xpress SARS-CoV-2/FLU/RSV  testing.  Fact Sheet for Patients: PinkCheek.be  Fact Sheet for Healthcare Providers: GravelBags.it  This test is not yet approved or cleared by the [place] FDA and  has been authorized for detection and/or diagnosis of SARS-CoV-2 by  FDA under an Emergency Use Authorization (EUA). This EUA will remain  in effect (meaning this test can be used) for the duration of the  Covid-19 declaration under  Section 564(b)(1) of the Act, 21  U.S.C. section 360bbb-3(b)(1), unless the authorization is  terminated or revoked.    Respiratory Syncytial Virus by PCR POSITIVE (A) NEGATIVE Final    Comment: (NOTE) Fact Sheet for Patients: PinkCheek.be  Fact Sheet for Healthcare Providers: GravelBags.it  This test is not yet approved or cleared by the [place] FDA and  has been authorized for detection and/or diagnosis of SARS-CoV-2 by  FDA under an Emergency Use Authorization (EUA). This EUA will remain  in effect (meaning this test can be used) for the duration of the  COVID-19 declaration under Section 564(b)(1) of the Act, 21 U.S.C.  section 360bbb-3(b)(1), unless the authorization is terminated or  revoked. Performed at [facility], [address], [place], [place] [address]        Inpatient Medications:   Scheduled Meds: .  acetaminophen  1,000 mg Oral Q8H   Continuous Infusions: .  ceFAZolin (ANCEF) IV 2 g (12/13/19 1525)  . [START ON 12/14/2019]  ceFAZolin (ANCEF) IV       Radiological Exams on Admission: DG Ankle Complete Right  Result Date: 12/13/2019 XR of the right ankle demonstrates loss of fixation of both the medial and lateral malleoli.  Medial plate and screws has pulled out.  Ankle is similar to injury films. Impression: failure of previous ORIF, both medial and lateral malleoli   Impression/Recommendations Principal Problem:   Closed right ankle fracture, sequela Active Problems:   Hyponatremia   Alcohol abuse   HTN (hypertension)   Depression with anxiety   Prostate CA (HCC)   BPH (benign prostatic hyperplasia)   Alcohol dependence (HCC)   Closed right ankle fracture   Tobacco abuse  Right ankle fracture sequelae-right ankle x-ray today- failure of previous ORIF, both medial and lateral malleoli. - Plans for surgery tomorrow by Dr. Amedeo [name] -n.p.o. midnight -Hold home aspirin for now  Hyponatremia- sodium 124.  Recent hospitalization sodium was 126, improved to 136 with hydration.  Likely due to high free water intake. No alcohol intake in at least a month. -Gentle hydration with N/s 75cc/hr for now - BMP a.m - Serum and urine Osm, urine sodium, pending result, may need fluid restriction.  History of alcohol dependence- patient is from nursing home. He tells me- no alcoholic drink, in at least 1 month. - Thiamine, folate, multivitamins -Check magnesium, phosphorus -Blood alcohol level- unfortunately already ordered  Hypertension-stable.  Propranolol was resumed on recent hospitalization.  Patient is not sure he has been getting this medication. -Hold propranolol, compliance issues  Depression, anxiety   - lexapro and xanax resumed on recent hospitalization, unable to tell me when last he took his medications, hold both, compliance issues.  Dyslipidemia -Resume statin  DVT  ppx- SCDS per Ortho.   Thank you for this consultation.  Our [facility] hospitalist team will follow the patient with you.  Bethena Roys M.D. [facility] 12/13/2019, 6:15 PM

## 2019-12-14 ENCOUNTER — Encounter (HOSPITAL_COMMUNITY)
Admission: AD | Disposition: A | Discharge status: Skilled Nursing Facility | Payer: Self-pay | Source: Ambulatory Visit | Attending: Internal Medicine

## 2019-12-14 ENCOUNTER — Inpatient Hospital Stay (HOSPITAL_COMMUNITY): Payer: Medicare Other | Admitting: Certified Registered"

## 2019-12-14 ENCOUNTER — Encounter (HOSPITAL_COMMUNITY): Payer: Self-pay | Admitting: Orthopedic Surgery

## 2019-12-14 ENCOUNTER — Inpatient Hospital Stay (HOSPITAL_COMMUNITY): Payer: Medicare Other

## 2019-12-14 ENCOUNTER — Inpatient Hospital Stay: Admit: 2019-12-14 | Payer: Medicare Other | Admitting: Orthopedic Surgery

## 2019-12-14 DIAGNOSIS — S82891S Other fracture of right lower leg, sequela: Secondary | ICD-10-CM | POA: Diagnosis not present

## 2019-12-14 DIAGNOSIS — T84126A Displacement of internal fixation device of bone of right lower leg, initial encounter: Secondary | ICD-10-CM | POA: Diagnosis not present

## 2019-12-14 DIAGNOSIS — S82891A Other fracture of right lower leg, initial encounter for closed fracture: Secondary | ICD-10-CM | POA: Diagnosis not present

## 2019-12-14 HISTORY — PX: HARDWARE REMOVAL: SHX979

## 2019-12-14 HISTORY — PX: IRRIGATION AND DEBRIDEMENT FOOT: SHX6602

## 2019-12-14 HISTORY — PX: EXTERNAL FIXATION LEG: SHX1549

## 2019-12-14 LAB — BASIC METABOLIC PANEL
Anion gap: 7 (ref 5–15)
BUN: 10 mg/dL (ref 8–23)
CO2: 25 mmol/L (ref 22–32)
Calcium: 8.5 mg/dL — ABNORMAL LOW (ref 8.9–10.3)
Chloride: 94 mmol/L — ABNORMAL LOW (ref 98–111)
Creatinine, Ser: 0.59 mg/dL — ABNORMAL LOW (ref 0.61–1.24)
GFR, Estimated: >60 mL/min (ref >60)
Glucose, Bld: 93 mg/dL (ref 70–99)
Potassium: 4.5 mmol/L (ref 3.5–5.1)
Sodium: 126 mmol/L — ABNORMAL LOW (ref 135–145)

## 2019-12-14 SURGERY — REMOVAL, HARDWARE
Anesthesia: General | Site: Ankle | Laterality: Right

## 2019-12-14 MED ORDER — ASPIRIN EC 81 MG PO TBEC
81.0000 mg | DELAYED_RELEASE_TABLET | Freq: Two times a day (BID) | ORAL | Status: DC
  Administered 2019-12-14 – 2019-12-26 (×23): 81 mg via ORAL
  Filled 2019-12-14 (×23): qty 1

## 2019-12-14 MED ORDER — PROPOFOL 10 MG/ML IV BOLUS
INTRAVENOUS | Status: AC
  Filled 2019-12-14: qty 40

## 2019-12-14 MED ORDER — DEXMEDETOMIDINE (PRECEDEX) IN NS 20 MCG/5ML (4 MCG/ML) IV SYRINGE
PREFILLED_SYRINGE | INTRAVENOUS | Status: AC
  Filled 2019-12-14: qty 5

## 2019-12-14 MED ORDER — EPHEDRINE SULFATE 50 MG/ML IJ SOLN
INTRAMUSCULAR | Status: DC | PRN
  Administered 2019-12-14 (×2): 5 mg via INTRAVENOUS

## 2019-12-14 MED ORDER — ROPIVACAINE HCL 5 MG/ML IJ SOLN
INTRAMUSCULAR | Status: AC
  Filled 2019-12-14: qty 30

## 2019-12-14 MED ORDER — LIDOCAINE HCL (CARDIAC) PF 100 MG/5ML IV SOSY
PREFILLED_SYRINGE | INTRAVENOUS | Status: DC | PRN
  Administered 2019-12-14 (×2): 1 mL via INTRAVENOUS

## 2019-12-14 MED ORDER — ROPIVACAINE HCL 5 MG/ML IJ SOLN
INTRAMUSCULAR | Status: DC | PRN
  Administered 2019-12-14: 10 mL via EPIDURAL
  Administered 2019-12-14: 20 mL via EPIDURAL

## 2019-12-14 MED ORDER — MIDAZOLAM HCL 2 MG/2ML IJ SOLN
INTRAMUSCULAR | Status: AC
  Filled 2019-12-14: qty 2

## 2019-12-14 MED ORDER — SEVOFLURANE IN SOLN
RESPIRATORY_TRACT | Status: AC
  Filled 2019-12-14: qty 250

## 2019-12-14 MED ORDER — FENTANYL CITRATE (PF) 100 MCG/2ML IJ SOLN
INTRAMUSCULAR | Status: DC | PRN
  Administered 2019-12-14 (×4): 25 ug via INTRAVENOUS

## 2019-12-14 MED ORDER — LACTATED RINGERS IV SOLN
INTRAVENOUS | Status: DC
  Administered 2019-12-14: 1000 mL via INTRAVENOUS

## 2019-12-14 MED ORDER — FENTANYL CITRATE (PF) 100 MCG/2ML IJ SOLN: 25.0000 ug | INTRAMUSCULAR | Status: DC | PRN

## 2019-12-14 MED ORDER — ORAL CARE MOUTH RINSE: 15.0000 mL | Freq: Once | OROMUCOSAL | Status: AC

## 2019-12-14 MED ORDER — ONDANSETRON HCL 4 MG/2ML IJ SOLN: 4.0000 mg | Freq: Once | INTRAMUSCULAR | Status: DC | PRN

## 2019-12-14 MED ORDER — LIDOCAINE 2% (20 MG/ML) 5 ML SYRINGE
INTRAMUSCULAR | Status: AC
  Filled 2019-12-14: qty 5

## 2019-12-14 MED ORDER — PROPOFOL 10 MG/ML IV BOLUS
INTRAVENOUS | Status: DC | PRN
  Administered 2019-12-14: 20 mg via INTRAVENOUS
  Administered 2019-12-14: 75 ug/kg/min via INTRAVENOUS

## 2019-12-14 MED ORDER — PROPOFOL 10 MG/ML IV BOLUS
INTRAVENOUS | Status: AC
  Filled 2019-12-14: qty 20

## 2019-12-14 MED ORDER — EPHEDRINE 5 MG/ML INJ
INTRAVENOUS | Status: AC
  Filled 2019-12-14: qty 10

## 2019-12-14 MED ORDER — DEXMEDETOMIDINE HCL IN NACL 200 MCG/50ML IV SOLN
INTRAVENOUS | Status: DC | PRN
  Administered 2019-12-14: 12 ug via INTRAVENOUS

## 2019-12-14 MED ORDER — HEMOSTATIC AGENTS (NO CHARGE) OPTIME
TOPICAL | Status: DC | PRN
  Administered 2019-12-14: 1 via TOPICAL

## 2019-12-14 MED ORDER — MIDAZOLAM HCL 5 MG/5ML IJ SOLN
INTRAMUSCULAR | Status: DC | PRN
  Administered 2019-12-14 (×2): 1 mg via INTRAVENOUS

## 2019-12-14 MED ORDER — 0.9 % SODIUM CHLORIDE (POUR BTL) OPTIME
TOPICAL | Status: DC | PRN
  Administered 2019-12-14: 1000 mL

## 2019-12-14 MED ORDER — BUPIVACAINE-EPINEPHRINE (PF) 0.5% -1:200000 IJ SOLN
INTRAMUSCULAR | Status: AC
  Filled 2019-12-14: qty 30

## 2019-12-14 MED ORDER — LABETALOL HCL 5 MG/ML IV SOLN: 10.0000 mg | INTRAVENOUS | Status: DC | PRN

## 2019-12-14 MED ORDER — SODIUM CHLORIDE 0.9 % IR SOLN
Status: DC | PRN
  Administered 2019-12-14 (×2): 3000 mL

## 2019-12-14 MED ORDER — CEFAZOLIN SODIUM-DEXTROSE 2-4 GM/100ML-% IV SOLN
2.0000 g | Freq: Three times a day (TID) | INTRAVENOUS | Status: AC
  Administered 2019-12-14 – 2019-12-15 (×3): 2 g via INTRAVENOUS
  Filled 2019-12-14 (×3): qty 100

## 2019-12-14 MED ORDER — PROPRANOLOL HCL ER 60 MG PO CP24
60.0000 mg | ORAL_CAPSULE | Freq: Every day | ORAL | Status: DC
  Administered 2019-12-15 – 2019-12-20 (×6): 60 mg via ORAL
  Filled 2019-12-14 (×8): qty 1

## 2019-12-14 MED ORDER — CHLORHEXIDINE GLUCONATE 0.12 % MT SOLN
15.0000 mL | Freq: Once | OROMUCOSAL | Status: AC
  Administered 2019-12-14: 15 mL via OROMUCOSAL

## 2019-12-14 MED ORDER — FENTANYL CITRATE (PF) 100 MCG/2ML IJ SOLN
INTRAMUSCULAR | Status: AC
  Filled 2019-12-14: qty 2

## 2019-12-14 SURGICAL SUPPLY — 57 items
BANDAGE ESMARK 4X12 BL STRL LF (DISPOSABLE) ×1 IMPLANT
BNDG COHESIVE 4X5 TAN STRL (GAUZE/BANDAGES/DRESSINGS) ×3 IMPLANT
BNDG ELASTIC 4X5.8 VLCR NS LF (GAUZE/BANDAGES/DRESSINGS) ×6 IMPLANT
BNDG ELASTIC 6X5.8 VLCR NS LF (GAUZE/BANDAGES/DRESSINGS) ×6 IMPLANT
BNDG ESMARK 4X12 BLUE STRL LF (DISPOSABLE) ×3
BNDG PLASTER X FAST 4X5 WHT LF (CAST SUPPLIES) ×12 IMPLANT
CLAMP LG COMBINATION (Clamp) ×9 IMPLANT
CLAMP LG MULTI PIN (Clamp) ×3 IMPLANT
CLOTH BEACON ORANGE TIMEOUT ST (SAFETY) ×3 IMPLANT
COVER LIGHT HANDLE STERIS (MISCELLANEOUS) ×6 IMPLANT
COVER WAND RF STERILE (DRAPES) ×3 IMPLANT
CUFF TOURN SGL QUICK 24 (TOURNIQUET CUFF) ×3
CUFF TRNQT CYL 24X4X16.5-23 (TOURNIQUET CUFF) ×1 IMPLANT
DRAPE C-ARM FOLDED MOBILE STRL (DRAPES) ×3 IMPLANT
DRAPE HALF SHEET 40X57 (DRAPES) ×6 IMPLANT
DRAPE U-SHAPE 47X51 STRL (DRAPES) ×3 IMPLANT
ELECT REM PT RETURN 9FT ADLT (ELECTROSURGICAL) ×3
ELECTRODE REM PT RTRN 9FT ADLT (ELECTROSURGICAL) ×1 IMPLANT
GAUZE SPONGE 4X4 12PLY STRL (GAUZE/BANDAGES/DRESSINGS) ×3 IMPLANT
GAUZE XEROFORM 5X9 LF (GAUZE/BANDAGES/DRESSINGS) ×3 IMPLANT
GLOVE BIOGEL M 7.0 STRL (GLOVE) ×3 IMPLANT
GLOVE BIOGEL PI IND STRL 7.0 (GLOVE) ×4 IMPLANT
GLOVE BIOGEL PI IND STRL 8 (GLOVE) ×2 IMPLANT
GLOVE BIOGEL PI INDICATOR 7.0 (GLOVE) ×8
GLOVE BIOGEL PI INDICATOR 8 (GLOVE) ×4
GLOVE ECLIPSE 6.5 STRL STRAW (GLOVE) ×6 IMPLANT
GLOVE SKINSENSE NS SZ8.0 LF (GLOVE) ×6
GLOVE SKINSENSE STRL SZ8.0 LF (GLOVE) ×3 IMPLANT
GOWN STRL REUS W/ TWL XL LVL3 (GOWN DISPOSABLE) ×2 IMPLANT
GOWN STRL REUS W/TWL LRG LVL3 (GOWN DISPOSABLE) ×9 IMPLANT
GOWN STRL REUS W/TWL XL LVL3 (GOWN DISPOSABLE) ×6
HEMOSTAT ARISTA ABSORB 3G PWDR (HEMOSTASIS) ×3 IMPLANT
INST SET MINOR BONE (KITS) ×3 IMPLANT
IV NS IRRIG 3000ML ARTHROMATIC (IV SOLUTION) ×6 IMPLANT
KIT TURNOVER KIT A (KITS) ×3 IMPLANT
MANIFOLD NEPTUNE II (INSTRUMENTS) ×3 IMPLANT
NEEDLE HYPO 25X1 1.5 SAFETY (NEEDLE) IMPLANT
NS IRRIG 1000ML POUR BTL (IV SOLUTION) ×3 IMPLANT
PACK BASIC LIMB (CUSTOM PROCEDURE TRAY) ×3 IMPLANT
PAD ABD 5X9 TENDERSORB (GAUZE/BANDAGES/DRESSINGS) ×3 IMPLANT
PAD ARMBOARD 7.5X6 YLW CONV (MISCELLANEOUS) ×3 IMPLANT
PAD CAST 4YDX4 CTTN HI CHSV (CAST SUPPLIES) ×5 IMPLANT
PADDING CAST COTTON 4X4 STRL (CAST SUPPLIES) ×15
PIN SHANZ TRANSFIX 6.0X225 (PIN) ×3 IMPLANT
ROD CRBN FBR LRG EX-FX 11X350 (Rod) ×6 IMPLANT
SCREW SHANZ 5.0X175MM (Screw) ×6 IMPLANT
SPONGE LAP 18X18 RF (DISPOSABLE) ×6 IMPLANT
SUT ETHILON 1 TP 1 60 (SUTURE) ×6 IMPLANT
SUT ETHILON 3 0 FSL (SUTURE) ×6 IMPLANT
SUT MON AB 2-0 SH 27 (SUTURE)
SUT MON AB 2-0 SH27 (SUTURE) IMPLANT
SUT SILK 2 0 FSL 18 (SUTURE) ×3 IMPLANT
SUT VIC AB 1 CT1 27 (SUTURE)
SUT VIC AB 1 CT1 27XBRD ANTBC (SUTURE) IMPLANT
SYR 30ML LL (SYRINGE) IMPLANT
SYR BULB IRRIG 60ML STRL (SYRINGE) ×6 IMPLANT
TOWEL OR 17X26 4PK STRL BLUE (TOWEL DISPOSABLE) ×9 IMPLANT

## 2019-12-14 NOTE — Transfer of Care (Signed)
Immediate Anesthesia Transfer of Care Note  Patient: Lee Wang  Procedure(s) Performed: HARDWARE REMOVAL OF RIGHT ANKLE (Right Ankle) ATTEMPTED EXTERNAL FIXATION RIGHT ANKLE (Right Ankle) IRRIGATION AND DEBRIDEMENT RIGHT ANKLE (Right Ankle)  Patient Location: PACU  Anesthesia Type:MAC and Regional  Level of Consciousness: awake, alert , oriented and patient cooperative  Airway & Oxygen Therapy: Patient Spontanous Breathing  Post-op Assessment: Report given to RN, Post -op Vital signs reviewed and stable and Patient moving all extremities  Post vital signs: Reviewed and stable  Last Vitals:  Vitals Value Taken Time  BP 94/70 12/14/19 1047  Temp    Pulse 91 12/14/19 1049  Resp 11 12/14/19 1049  SpO2 96 % 12/14/19 1049  Vitals shown include unvalidated device data.  Last Pain:  Vitals:   12/14/19 0643  TempSrc: Oral  PainSc: 0-No pain      Patients Stated Pain Goal: 5 (37/94/44 6190)  Complications: No complications documented.

## 2019-12-14 NOTE — Op Note (Signed)
Orthopaedic Surgery Operative Note (CSN: [id number])  Lee Wang  [birth year] Date of Surgery: 12/14/2019   Diagnoses:  Right ankle fracture; failure of hardware  Procedure: Removal of hardware from right ankle, irrigation and debridement, attempted placement of external fixation and splint application   Operative Finding The field hardware from the previous surgery was easily removed.  All implants were accounted for.  The wounds were not grossly infected.  We attempted to place an external fixator, but due to the poor quality of his bone, the calcaneus pin did not maintain fixation within the bone.  This was pulled out by hand inadvertently.  The wounds were irrigated once again, partially closed and the ankle was reduced to the best of our ability.  Post-operative plan:  The patient will be returned to the floor.   DVT prophylaxis Aspirin 81 mg twice daily for 6 weeks.    Pain control with PRN pain medication preferring oral medicines.   Follow up plan to be determined Patient will require further surgery, with time and specifics to be determined.  Post-Op Diagnosis: Same Surgeons:Primary: Mordecai Rasmussen, MD Assistants:  Location: [facility] ROOM 3 Anesthesia: Sedation plus regional anesthesia Antibiotics: Ancef 2 g Tourniquet time:  Total Tourniquet Time Documented: Thigh (Right) - 72 minutes Thigh (Right) - 18 minutes Total: Thigh (Right) - 90 minutes  Estimated Blood Loss: Minimal Complications: None Specimens: None Implants: Implant Name Type Inv. Item Serial No. Manufacturer Lot No. LRB No. Used Action  PLATE BROAD STR 4H - SNA Plate PLATE BROAD STR 4H NA [employer] NA Right 1 Explanted  SCREW BONE 3.5X16MM - SNA Screw SCREW BONE 3.5X16MM NA [employer] NA Right 1 Explanted  SCREW 60X4.0MM - SNA Screw SCREW 60X4.0MM NA [employer] NA Right 1 Explanted  SCREW BONE 14MMX3.5MM - SNA Screw SCREW BONE 14MMX3.5MM NA [employer] NA Right 1  Explanted  SCREW BONE 3.5X32MM - SNA Screw SCREW BONE 3.5X32MM NA [employer] NA Right 1 Explanted  SCREW BONE 3.5X34 - SNA Screw SCREW BONE 3.5X34 NA [employer] NA Right 1 Explanted  SCREW BONE 3.5X44 - SNA Screw SCREW BONE 3.5X44 NA [employer] NA Right 1 Explanted  SCREW LOCK 20MMX3.5 - SNA Screw SCREW LOCK 20MMX3.5 NA [employer] NA Right 1 Explanted  SCREW LOCKING 3.5X16MM - SNA Screw SCREW LOCKING 3.5X16MM NA [employer] NA Right 1 Explanted  WASHER V FOOT - SNA Washer WASHER V FOOT NA [employer] NA Right 1 Explanted    Indications for Surgery:   Lee Wang is a 74 y.o. male who sustained a significant right ankle fracture dislocation.  This was closed reduced in the operating room, and provisionally placed in a splint.  He returned to the operating room approximately 2 weeks after the reduction for definitive fixation.  During the ORIF it was noted that his bone was of poor quality.  Nonetheless, we are satisfied with our fixation at the completion of that surgery.  Return to clinic yesterday for his first postoperative follow-up and it was readily apparent that fixation had failed.  The fibular plate was clearly visible.  Essentially the fracture has been converted to an open fracture and although it was not grossly infected, he did require urgent surgery for hardware removal and irrigation and debridement.  This was conveyed to the patient and his son who agreed with surgery..  Benefits and risks of operative and nonoperative management were discussed prior to surgery with patient/guardian(s) and informed consent form was  completed.  Specific risks including infection, need for additional surgery, amputation and severe complications associated with anesthesia   Procedure:   The patient was identified properly. Informed consent was obtained and the surgical site was marked. The patient was taken up to suite where general anesthesia was  induced.  The patient was positioned supine on a standard operating room table.  The right leg was prepped and draped in the usual sterile fashion.  Timeout was performed before the beginning of the case.  Tourniquet was used for the above duration.  He received an additional dose of Ancef prior to surgery.  All previously placed sutures were removed and subsequently discarded.  We started on his lateral ankle where the plate and screws were exposed.  There was significant granulation tissue at the wound edges, including overlying the distal aspect of the plate.  Multiple screws were already exposed, and these were easily removed.  We then proceeded to expose the entire plate and all screws and the fibular plate was removed.  This was confirmed under fluoroscopy.  We then turned our attention to the medial aspect of the ankle.  The large medial malleolus fragment was significantly displaced.  There are multiple additional fragments of the medial malleolus.  We had some some significant difficulty exposing and finding the plate.  There was some scar tissue that had formed at the fracture site.  As a result of the repeat exposure, the saphenous vein was subsequently damaged.  We obtained hemostasis with multiple silk ties.  With the assistance of fluoroscopy, the medial plate and screws was identified deep to the medial malleolus fragment.  We were able to remove the screws and the plate after identifying how much the plate had shifted.  Final orthogonal images on fluoroscopy confirmed that all hardware had been removed.  We then turned our attention to placement of the external fixator.  2 tibial pins were placed without difficulty.  Placement was confirmed under fluoroscopy.  We then placed a calcaneal pin.  At this point, it was noted that the quality of the bone within the calcaneus was very poor as the pin was driven through the calcaneus very easily.  The pin bank was then attached proximally with  connector bars ultimately connecting the tibial pins to the transcalcaneal pin.  We tried multiple times to achieve adequate reduction.  Due to the size of the medial malleolus fragment, we are unable to adequately reduce the talus underneath the unaffected portion of the tibia.  We used a freer elevator to attempt to lever the the tibiotalar joint into a better position.  Despite several attempts with traction and reduction maneuvers, we are unable to adequately achieve an appropriate reduction.  In the process, the transcalcaneal pin was inadvertently pulled out of the calcaneus.  As a result, we lost control of the foot and I did not feel that I could achieve adequate fixation distally in order to hold the fracture reduced and out to length.  At this point, I made the decision to remove all external fixator hardware irrigate the wounds once again and closed the incisions as well as I could with plan for provisional stabilization in the splint.  Due to the significant injury that he had sustained, and the poor quality of his bone, I am not convinced that we will be able to achieve definitive fixation of her via open reduction and internal fixation.  At this point, it is conceivable that a below-knee amputation is the best option.  We then proceeded to close the incisions with 0 nylon with multiple trauma stitches.  The lateral wound was almost completely closed.  Upon returning our attention to the medial wound, it was noted that there was significant oozing.  There was no pulsatile bleeding.  We then proceeded to place a clotting powder in the wound, which significantly slowed the bleeding.  The medial wound was then closed with multiple 0 nylon sutures.  Xeroform,  Gauze, and ABD pads covered all of the incisions.  A well-padded splint was placed and the attempted reduction demonstrated incomplete reduction of the tibiotalar joint.  Patient was awoken taken to PACU in stable condition.  He will return to  the floor once he is alert.

## 2019-12-14 NOTE — Progress Notes (Signed)
Patient Demographics:    Lee Wang, is a 74 y.o. male, DOB - [birth year], YTK:[id number]  Admit date - 12/13/2019   Admitting Physician Mordecai Rasmussen, MD  Outpatient Primary MD for the patient is Patient, No Pcp Per  LOS - 1   No chief complaint on file.       Subjective:    Lee Wang today has no fevers, no emesis,  No chest pain,    No Nausea, Vomiting or Diarrhea    Assessment  & Plan :    Principal Problem:   Closed right ankle fracture, sequela Active Problems:   Alcohol abuse   HTN (hypertension)   Depression with anxiety   Prostate CA (HCC)   BPH (benign prostatic hyperplasia)   Hyponatremia   Alcohol dependence (HCC)   Closed right ankle fracture   Tobacco abuse  Brief Summary:- -74 year old male with past medical history relevant for h/o Etoh abuse, BPH, prostate cancer , tobacco abuse, HLD and HTN who had a witnessed mechanical fall after missing a step on 11/19/2019 subsequently imaging studies demonstrated fracture dislocation of the ankle and acute fracture (avulsion) through the base of the fifth metatarsal, he underwent Closed reduction and splinting of bimalleolar right ankle fracture on 11/20/19, on 11/30/2019 patient underwent- Removal of pre-existing plate and screws from the fibula with revision ORIF of the fibula with a locking plate.  ORIF of the vertical shear fracture of the medial malleolus -On 12/14/2019 patient was found to have failure of hardware with essentially conversion of his close fractures and open fracture, on 12/14/19 patient underwent Removal of hardware from right ankle, irrigation and debridement,  unsuccessful attempt at placement of external fixation and splint application  A/p 1)Symptomatic Hyponatremia--patient with history of alcohol abuse as well as history of excessive free water intake -Sodium is currently up to 126 from 124 -Lexapro may be  contributing to patient's hyponatremia  2)RT Ankle Fracture with failed hardware---On 12/14/2019 patient was found to have failure of hardware with essentially conversion of his close fractures and open fracture, on 12/14/19 patient underwent Removal of hardware from right ankle, irrigation and debridement,  unsuccessful attempt at placement of external fixation and splint application -further Rx per orthopedic team  3)HTN--stable, restart propranolol ER 60 mg in a.m., IV labetalol for now as needed elevated BP  4)Depression/Anxiety--May use Xanax as needed, okay to restart Lexapro at 10 mg daily  5)Alcohol and Tobacco Abuse--continue patch as ordered, multivitamin -Low risk for DTs since no recent EtOH use  Disposition/Need for in-Hospital Stay- patient unable to be discharged at this time due to --unstable right ankle/failed hardware with significant ambulatory dysfunction requiring further orthopedic intervention, as well as HypoNatremia requiring IV fluid  Status is: Inpatient  Remains inpatient appropriate because:unstable right ankle/failed hardware with significant ambulatory dysfunction requiring further orthopedic intervention, as well as HypoNatremia requiring IV fluid   Disposition: The patient is from: SNF              Anticipated d/c is to: SNF              Anticipated d/c date is: > 3 days              Patient currently is not medically stable to  d/c. Barriers: Not Clinically Stable- unstable right ankle/failed hardware with significant ambulatory dysfunction requiring further orthopedic intervention, as well as HypoNatremia requiring IV fluid  Code Status : Full code  Family Communication:    (patient is alert, awake and coherent)  -Discussed with daughter-- Leslie---[phone] Consults  :  ortho  DVT Prophylaxis  :    SCDs /Aspirin  Lab Results  Component Value Date   PLT 323 12/13/2019   Inpatient Medications  Scheduled Meds:  aspirin EC  81 mg Oral BID    atorvastatin  40 mg Oral Daily   folic acid  1 mg Oral Daily   multivitamin with minerals  1 tablet Oral Daily   thiamine  100 mg Oral Daily   Or   thiamine  100 mg Intravenous Daily   Continuous Infusions:  sodium chloride 75 mL/hr at 12/13/19 1846    ceFAZolin (ANCEF) IV     PRN Meds:.  Anti-infectives (From admission, onward)   Start     Dose/Rate Route Frequency Ordered Stop   12/14/19 1400  ceFAZolin (ANCEF) IVPB 2g/100 mL premix        2 g 200 mL/hr over 30 Minutes Intravenous Every 8 hours 12/14/19 1158 12/15/19 1359   12/14/19 0600  ceFAZolin (ANCEF) IVPB 2g/100 mL premix        2 g 200 mL/hr over 30 Minutes Intravenous On call to O.R. 12/13/19 1316 12/14/19 0807   12/13/19 1415  ceFAZolin (ANCEF) IVPB 2g/100 mL premix        2 g 200 mL/hr over 30 Minutes Intravenous Every 8 hours 12/13/19 1315 12/14/19 0607        Objective:   Vitals:   12/14/19 1130 12/14/19 1137 12/14/19 1138 12/14/19 1214  BP: (!) 84/65 98/66  104/65  Pulse: 79 82 79 69  Resp: 15 13 14 20   Temp:  97.9 F (36.6 C)  98.5 F (36.9 C)  TempSrc:    Oral  SpO2: 96% 96% 96% 98%  Weight:      Height:        Wt Readings from Last 3 Encounters:  12/13/19 56.2 kg  11/30/19 63.1 kg  11/29/19 63.1 kg    Intake/Output Summary (Last 24 hours) at 12/14/2019 1442 Last data filed at 12/14/2019 1145 Gross per 24 hour  Intake 2849.96 ml  Output 900 ml  Net 1949.96 ml   Physical Exam Gen:- Awake Alert,  In no apparent distress  HEENT:- [place].AT, No sclera icterus Neck-Supple Neck,No JVD,  Lungs-  CTAB , fair symmetrical air movement CV- S1, S2 normal, regular  Abd-  +ve B.Sounds, Abd Soft, No tenderness,    Extremity/Skin:-  pedal pulses present  Psych-affect is appropriate, oriented x3 Neuro-no new focal deficits, no tremors MSK-right leg dressing wraps intact   Data Review:   Micro Results Recent Results (from the past 240 hour(s))  Respiratory Panel by RT PCR (Flu A&B, Covid) -  Nasopharyngeal Swab     Status: None   Collection Time: 12/13/19  1:15 PM   Specimen: Nasopharyngeal Swab  Result Value Ref Range Status   SARS Coronavirus 2 by RT PCR NEGATIVE NEGATIVE Final    Comment: (NOTE) SARS-CoV-2 target nucleic acids are NOT DETECTED.  The SARS-CoV-2 RNA is generally detectable in upper respiratoy specimens during the acute phase of infection. The lowest concentration of SARS-CoV-2 viral copies this assay can detect is 131 copies/mL. A negative result does not preclude SARS-Cov-2 infection and should not be used as the sole basis  for treatment or other patient management decisions. A negative result may occur with  improper specimen collection/handling, submission of specimen other than nasopharyngeal swab, presence of viral mutation(s) within the areas targeted by this assay, and inadequate number of viral copies (<131 copies/mL). A negative result must be combined with clinical observations, patient history, and epidemiological information. The expected result is Negative.  Fact Sheet for Patients:  PinkCheek.be  Fact Sheet for Healthcare Providers:  GravelBags.it  This test is no t yet approved or cleared by the [place] FDA and  has been authorized for detection and/or diagnosis of SARS-CoV-2 by FDA under an Emergency Use Authorization (EUA). This EUA will remain  in effect (meaning this test can be used) for the duration of the COVID-19 declaration under Section 564(b)(1) of the Act, 21 U.S.C. section 360bbb-3(b)(1), unless the authorization is terminated or revoked sooner.     Influenza A by PCR NEGATIVE NEGATIVE Final   Influenza B by PCR NEGATIVE NEGATIVE Final    Comment: (NOTE) The Xpert Xpress SARS-CoV-2/FLU/RSV assay is intended as an aid in  the diagnosis of influenza from Nasopharyngeal swab specimens and  should not be used as a sole basis for treatment. Nasal washings and   aspirates are unacceptable for Xpert Xpress SARS-CoV-2/FLU/RSV  testing.  Fact Sheet for Patients: PinkCheek.be  Fact Sheet for Healthcare Providers: GravelBags.it  This test is not yet approved or cleared by the [place] FDA and  has been authorized for detection and/or diagnosis of SARS-CoV-2 by  FDA under an Emergency Use Authorization (EUA). This EUA will remain  in effect (meaning this test can be used) for the duration of the  Covid-19 declaration under Section 564(b)(1) of the Act, 21  U.S.C. section 360bbb-3(b)(1), unless the authorization is  terminated or revoked. Performed at [facility], [address], [place], [place] [address]   Resp Panel by RT PCR (RSV, Flu A&B, Covid) -     Status: Abnormal   Collection Time: 12/13/19  1:15 PM  Result Value Ref Range Status   SARS Coronavirus 2 by RT PCR NEGATIVE NEGATIVE Final    Comment: (NOTE) SARS-CoV-2 target nucleic acids are NOT DETECTED.  The SARS-CoV-2 RNA is generally detectable in upper respiratoy specimens during the acute phase of infection. The lowest concentration of SARS-CoV-2 viral copies this assay can detect is 131 copies/mL. A negative result does not preclude SARS-Cov-2 infection and should not be used as the sole basis for treatment or other patient management decisions. A negative result may occur with  improper specimen collection/handling, submission of specimen other than nasopharyngeal swab, presence of viral mutation(s) within the areas targeted by this assay, and inadequate number of viral copies (<131 copies/mL). A negative result must be combined with clinical observations, patient history, and epidemiological information. The expected result is Negative.  Fact Sheet for Patients:  PinkCheek.be  Fact Sheet for Healthcare Providers:  GravelBags.it  This test is no t yet  approved or cleared by the [place] FDA and  has been authorized for detection and/or diagnosis of SARS-CoV-2 by FDA under an Emergency Use Authorization (EUA). This EUA will remain  in effect (meaning this test can be used) for the duration of the COVID-19 declaration under Section 564(b)(1) of the Act, 21 U.S.C. section 360bbb-3(b)(1), unless the authorization is terminated or revoked sooner.     Influenza A by PCR NEGATIVE NEGATIVE Final   Influenza B by PCR NEGATIVE NEGATIVE Final    Comment: (NOTE) The Xpert Xpress SARS-CoV-2/FLU/RSV  assay is intended as an aid in  the diagnosis of influenza from Nasopharyngeal swab specimens and  should not be used as a sole basis for treatment. Nasal washings and  aspirates are unacceptable for Xpert Xpress SARS-CoV-2/FLU/RSV  testing.  Fact Sheet for Patients: PinkCheek.be  Fact Sheet for Healthcare Providers: GravelBags.it  This test is not yet approved or cleared by the [place] FDA and  has been authorized for detection and/or diagnosis of SARS-CoV-2 by  FDA under an Emergency Use Authorization (EUA). This EUA will remain  in effect (meaning this test can be used) for the duration of the  Covid-19 declaration under Section 564(b)(1) of the Act, 21  U.S.C. section 360bbb-3(b)(1), unless the authorization is  terminated or revoked.    Respiratory Syncytial Virus by PCR POSITIVE (A) NEGATIVE Final    Comment: (NOTE) Fact Sheet for Patients: PinkCheek.be  Fact Sheet for Healthcare Providers: GravelBags.it  This test is not yet approved or cleared by the [place] FDA and  has been authorized for detection and/or diagnosis of SARS-CoV-2 by  FDA under an Emergency Use Authorization (EUA). This EUA will remain  in effect (meaning this test can be used) for the duration of the  COVID-19 declaration under  Section 564(b)(1) of the Act, 21 U.S.C.  section 360bbb-3(b)(1), unless the authorization is terminated or  revoked. Performed at [facility], [address], [place], [place] [address]   Surgical pcr screen     Status: None   Collection Time: 12/13/19  3:58 PM   Specimen: Nasal Mucosa; Nasal Swab  Result Value Ref Range Status   MRSA, PCR NEGATIVE NEGATIVE Final   Staphylococcus aureus NEGATIVE NEGATIVE Final    Comment: (NOTE) The Xpert SA Assay (FDA approved for NASAL specimens in patients 9 years of age and older), is one component of a comprehensive surveillance program. It is not intended to diagnose infection nor to guide or monitor treatment. Performed at [facility], [address], [place], [place] [address]     Radiology Reports DG Ankle 2 Views Right  Result Date: 11/30/2019 CLINICAL DATA:  ORIF right ankle fracture EXAM: DG C-ARM 1-60 MIN; RIGHT ANKLE - 2 VIEW COMPARISON:  11/20/2019 FLUOROSCOPY TIME:  Fluoroscopy Time:  2 minutes Radiation Exposure Index (if provided by the fluoroscopic device): Not available Number of Acquired Spot Images: 5 FINDINGS: Previously seen fixation sideplate is again noted along the distal fibula. Previously seen medial malleolar fracture is again noted involving the tibiotalar articulation. The subluxation of the talus was reduced and a fixation sideplate was placed along the distal tibia with an additional fixation screw traversing the medial malleolus. New fixation screws noted traversing the fibular sideplate into the tibia. Fracture fragments are in near anatomic alignment. IMPRESSION: Progressive ORIF of distal tibial and fibular fractures. Electronically Signed   By: Inez Catalina M.D.   On: 11/30/2019 15:14   DG Ankle 2 Views Right  Result Date: 11/20/2019 CLINICAL DATA:  74 year old male with comminuted ankle fracture. Intraoperative images. EXAM: DG C-ARM 1-60 MIN; RIGHT ANKLE - 2 VIEW FLUOROSCOPY TIME:  Fluoroscopy Time:  0 minutes  2nd Radiation Exposure Index (if provided by the fluoroscopic device): Number of Acquired Spot Images: 0 COMPARISON:  Right ankle series 11/19/2019. FINDINGS: Five intraoperative fluoroscopic spot views of the right ankle demonstrate reduced comminuted fracture dislocation with near anatomic alignment of the medial malleolus fragment. A fragment of the lateral malleolus superimposed on prior distal fibula ORIF is less well visualized fluoroscopically. The hardware  appears stable. IMPRESSION: Improved, near anatomic alignment of right ankle fracture dislocation. Previous distal fibula ORIF. Electronically Signed   By: Genevie Ann M.D.   On: 11/20/2019 19:19   DG Ankle Complete Right  Result Date: 12/14/2019 CLINICAL DATA:  Removal of hardware in attempted external fixation. EXAM: DG C-ARM 1-60 MIN; RIGHT ANKLE - COMPLETE 3+ VIEW FLUOROSCOPY TIME:  Fluoroscopy Time:  1 minutes 19 seconds COMPARISON:  Radiographs December 13, 2019. FINDINGS: Five C-arm fluoroscopic images were obtained intraoperatively and submitted for post operative interpretation. These images demonstrate removal of the previously noted ankle fixation hardware. Image labeled #3 demonstrates attempted external fixation of the tibia with 2 screws. Please see the performing provider's procedural report for further detail. IMPRESSION: Intraoperative fluoroscopic images, as detailed above. Electronically Signed   By: Margaretha Sheffield MD   On: 12/14/2019 11:38   DG Ankle Complete Right  Result Date: 12/13/2019 XR of the right ankle demonstrates loss of fixation of both the medial and lateral malleoli.  Medial plate and screws has pulled out.  Ankle is similar to injury films. Impression: failure of previous ORIF, both medial and lateral malleoli  DG Ankle Complete Right  Result Date: 11/19/2019 CLINICAL DATA:  Pain EXAM: RIGHT ANKLE - COMPLETE 3+ VIEW COMPARISON:  None. FINDINGS: There is an acute displaced trimalleolar fracture with disruption  of the ankle mortise. There is significant medial displacement of the talus with respect to the distal tibia. There is a large cyst displaced fracture fragment measuring 4.5 cm involving the medial malleolus. There may be a subtle impaction fracture involving the lateral articular surface of the talus. There is extensive surrounding soft tissue swelling. The patient's prior plate and screw fixation of the distal fibula is bent. There is an acute fracture through the base of the fifth metatarsal. The posterior calcaneus is suboptimally evaluated secondary to overlapping objects external to the patient. IMPRESSION: 1. Fracture dislocation of the ankle as detailed above. 2. Acute fracture through the base of the fifth metatarsal is favored to represent an avulsion fracture, however dedicated foot radiographs are recommended. Electronically Signed   By: Constance Holster M.D.   On: 11/19/2019 22:20   DG Chest Port 1 View  Result Date: 11/19/2019 CLINICAL DATA:  Fall EXAM: PORTABLE CHEST 1 VIEW COMPARISON:  None. FINDINGS: Lungs are clear. No pneumothorax or pleural effusion. Cardiac size within normal limits. Pulmonary vascularity is normal. There are probable acute fractures of the left 6, 7, and 8 ribs posterolaterally demonstrating minimal displacement. IMPRESSION: Acute left rib fractures.  No pneumothorax. Electronically Signed   By: Fidela Salisbury MD   On: 11/19/2019 23:20   DG Ankle Right Port  Result Date: 11/30/2019 CLINICAL DATA:  Status post ORIF of right ankle fracture EXAM: PORTABLE RIGHT ANKLE - 2 VIEW COMPARISON:  11/20/2019 FINDINGS: There has been interval removal of 1 of the fixation screws in the fibular side plate with a longer screw placed traversing into the tibia. Fixation sideplate along the medial aspect of the tibia is noted with several screws. A single screws noted traversing the medial malleolus. Fracture fragments are in near anatomic alignment. Splinting material is noted.  IMPRESSION: Interval progressive fixation of the distal tibial and fibular fractures. Electronically Signed   By: Inez Catalina M.D.   On: 11/30/2019 15:13   DG Ankle Right Port  Result Date: 11/20/2019 CLINICAL DATA:  74 year old male with comminuted ankle fracture. Status post closed reduction and splinting of bimalleolar right ankle fracture. EXAM: PORTABLE RIGHT  ANKLE - 2 VIEW COMPARISON:  Intraoperative images today. Right ankle series 11/19/2019. FINDINGS: Previous distal right fibula ORIF. Splint or cast material now in place. Substantially improved alignment of right ankle fracture dislocation compared to yesterday. On these images there is residual mild displacement and distraction of the medial malleolus fracture. No new osseous injury identified. IMPRESSION: Closed reduction of comminuted right ankle fracture dislocation. Prior distal fibula ORIF. Electronically Signed   By: Genevie Ann M.D.   On: 11/20/2019 19:21   DG C-Arm 1-60 Min  Result Date: 12/14/2019 CLINICAL DATA:  Removal of hardware in attempted external fixation. EXAM: DG C-ARM 1-60 MIN; RIGHT ANKLE - COMPLETE 3+ VIEW FLUOROSCOPY TIME:  Fluoroscopy Time:  1 minutes 19 seconds COMPARISON:  Radiographs December 13, 2019. FINDINGS: Five C-arm fluoroscopic images were obtained intraoperatively and submitted for post operative interpretation. These images demonstrate removal of the previously noted ankle fixation hardware. Image labeled #3 demonstrates attempted external fixation of the tibia with 2 screws. Please see the performing provider's procedural report for further detail. IMPRESSION: Intraoperative fluoroscopic images, as detailed above. Electronically Signed   By: Margaretha Sheffield MD   On: 12/14/2019 11:38   DG C-Arm 1-60 Min  Result Date: 11/30/2019 CLINICAL DATA:  ORIF right ankle fracture EXAM: DG C-ARM 1-60 MIN; RIGHT ANKLE - 2 VIEW COMPARISON:  11/20/2019 FLUOROSCOPY TIME:  Fluoroscopy Time:  2 minutes Radiation Exposure  Index (if provided by the fluoroscopic device): Not available Number of Acquired Spot Images: 5 FINDINGS: Previously seen fixation sideplate is again noted along the distal fibula. Previously seen medial malleolar fracture is again noted involving the tibiotalar articulation. The subluxation of the talus was reduced and a fixation sideplate was placed along the distal tibia with an additional fixation screw traversing the medial malleolus. New fixation screws noted traversing the fibular sideplate into the tibia. Fracture fragments are in near anatomic alignment. IMPRESSION: Progressive ORIF of distal tibial and fibular fractures. Electronically Signed   By: Inez Catalina M.D.   On: 11/30/2019 15:14   DG C-Arm 1-60 Min  Result Date: 11/20/2019 CLINICAL DATA:  74 year old male with comminuted ankle fracture. Intraoperative images. EXAM: DG C-ARM 1-60 MIN; RIGHT ANKLE - 2 VIEW FLUOROSCOPY TIME:  Fluoroscopy Time:  0 minutes 2nd Radiation Exposure Index (if provided by the fluoroscopic device): Number of Acquired Spot Images: 0 COMPARISON:  Right ankle series 11/19/2019. FINDINGS: Five intraoperative fluoroscopic spot views of the right ankle demonstrate reduced comminuted fracture dislocation with near anatomic alignment of the medial malleolus fragment. A fragment of the lateral malleolus superimposed on prior distal fibula ORIF is less well visualized fluoroscopically. The hardware appears stable. IMPRESSION: Improved, near anatomic alignment of right ankle fracture dislocation. Previous distal fibula ORIF. Electronically Signed   By: Genevie Ann M.D.   On: 11/20/2019 19:19     CBC Recent Labs  Lab 12/13/19 1341  WBC 10.3  HGB 11.2*  HCT 33.4*  PLT 323  MCV 96.8  MCH 32.5  MCHC 33.5  RDW 12.2    Chemistries  Recent Labs  Lab 12/13/19 1341 12/14/19 0511  NA 124* 126*  K 4.0 4.5  CL 91* 94*  CO2 25 25  GLUCOSE 94 93  BUN 12 10  CREATININE 0.65 0.59*  CALCIUM 8.5* 8.5*    ------------------------------------------------------------------------------------------------------------------ No results for input(s): CHOL, HDL, LDLCALC, TRIG, CHOLHDL, LDLDIRECT in the last 72 hours.  No results found for: HGBA1C ------------------------------------------------------------------------------------------------------------------ No results for input(s): TSH, T4TOTAL, T3FREE, THYROIDAB in the last 72 hours.  Invalid input(s): FREET3 ------------------------------------------------------------------------------------------------------------------ No results for input(s): VITAMINB12, FOLATE, FERRITIN, TIBC, IRON, RETICCTPCT in the last 72 hours.  Coagulation profile No results for input(s): INR, PROTIME in the last 168 hours.  No results for input(s): DDIMER in the last 72 hours.  Cardiac Enzymes No results for input(s): CKMB, TROPONINI, MYOGLOBIN in the last 168 hours.  Invalid input(s): CK ------------------------------------------------------------------------------------------------------------------ No results found for: BNP   Roxan Hockey M.D on 12/14/2019 at 2:42 PM  Go to [email or web address] - for contact info  [facility] - Office  [phone]

## 2019-12-14 NOTE — Anesthesia Preprocedure Evaluation (Signed)
Anesthesia Evaluation  Patient identified by MRN, date of birth, ID band Patient awake    Reviewed: Allergy & Precautions, H&P , NPO status , Patient's Chart, lab work & pertinent test results, reviewed documented beta blocker date and time   Airway Mallampati: II  TM Distance: >3 FB Neck ROM: full    Dental no notable dental hx.    Pulmonary neg pulmonary ROS, Current Smoker and Patient abstained from smoking.,    Pulmonary exam normal breath sounds clear to auscultation       Cardiovascular Exercise Tolerance: Good hypertension, negative cardio ROS   Rhythm:regular Rate:Normal     Neuro/Psych Anxiety Depression negative neurological ROS  negative psych ROS   GI/Hepatic negative GI ROS, Neg liver ROS, (+)       alcohol use,   Endo/Other  negative endocrine ROS  Renal/GU negative Renal ROS  negative genitourinary   Musculoskeletal   Abdominal   Peds  Hematology negative hematology ROS (+)   Anesthesia Other Findings   Reproductive/Obstetrics negative OB ROS                             Anesthesia Physical  Anesthesia Plan  ASA: II  Anesthesia Plan: General   Post-op Pain Management:    Induction:   PONV Risk Score and Plan: Ondansetron  Airway Management Planned:   Additional Equipment:   Intra-op Plan:   Post-operative Plan:   Informed Consent: I have reviewed the patients History and Physical, chart, labs and discussed the procedure including the risks, benefits and alternatives for the proposed anesthesia with the patient or authorized representative who has indicated his/her understanding and acceptance.     Dental Advisory Given  Plan Discussed with: CRNA  Anesthesia Plan Comments:         Anesthesia Quick Evaluation

## 2019-12-14 NOTE — Anesthesia Postprocedure Evaluation (Signed)
Anesthesia Post Note  Patient: Lee Wang  Procedure(s) Performed: HARDWARE REMOVAL OF RIGHT ANKLE (Right Ankle) ATTEMPTED EXTERNAL FIXATION RIGHT ANKLE (Right Ankle) IRRIGATION AND DEBRIDEMENT RIGHT ANKLE (Right Ankle)  Patient location during evaluation: PACU Anesthesia Type: Regional and MAC Level of consciousness: awake, awake and alert and patient cooperative Pain management: pain level controlled Vital Signs Assessment: post-procedure vital signs reviewed and stable Respiratory status: spontaneous breathing, respiratory function stable and nonlabored ventilation Cardiovascular status: blood pressure returned to baseline and stable Postop Assessment: no headache and no backache Anesthetic complications: no   No complications documented.   Last Vitals:  Vitals:   12/14/19 0528 12/14/19 0643  BP: 138/70 137/78  Pulse: 63 70  Resp: 18 18  Temp: 36.6 C 36.7 C  SpO2: 99% 99%    Last Pain:  Vitals:   12/14/19 0643  TempSrc: Oral  PainSc: 0-No pain                 Tacy Learn

## 2019-12-14 NOTE — Anesthesia Procedure Notes (Signed)
Anesthesia Regional Block: Popliteal block   Pre-Anesthetic Checklist: ,, timeout performed, Correct Patient, Correct Site, Correct Laterality, Correct Procedure, Correct Position, site marked, Risks and benefits discussed,  Surgical consent,  Pre-op evaluation,  At surgeon's request and post-op pain management  Laterality: Right and Lower  Prep: chloraprep       Needles:   Needle Type: Stimiplex     Needle Length: 13cm  Needle Gauge: 22     Additional Needles:   Procedures:, nerve stimulator,,, ultrasound used (permanent image in chart),,,,  Narrative:  Start time: 12/14/2019 7:13 AM End time: 12/14/2019 7:23 AM Injection made incrementally with aspirations every 5 mL.  Performed by: With CRNAs  Anesthesiologist: Louann Sjogren, MD CRNA: Tacy Learn, CRNA  Additional Notes: Pt tolerated well, Ropi 0.5% 30 ml.

## 2019-12-14 NOTE — Progress Notes (Signed)
   ORTHOPAEDIC PROGRESS NOTE  Scheduled for:  R ankle I&D, HARDWARE REMOVAL and Ex-Fix R ankle  DOS: 12/14/19  SUBJECTIVE: NPO since midnight.  No issues over night.  Pain is well controlled.   OBJECTIVE: PE:  Vitals:   12/14/19 0528 12/14/19 0643  BP: 138/70 137/78  Pulse: 63 70  Resp: 18 18  Temp: 97.8 F (36.6 C) 98.1 F (36.7 C)  SpO2: 99% 99%   Alert this morning.  No acute distress  RLE splint is clean, dry and intact Toes WWP Sensation intact distally  ASSESSMENT: Lee Wang is a 74 y.o. male ready for OR this morning  PLAN: Weightbearing: NWB RLE Insicional and dressing care: Reinforce dressings as needed Orthopedic device(s): External fixator to remain in place postop VTE prophylaxis: Aspirin 81mg  BID for 6 weeks, to resume postop Pain control: PO medications PRN Follow - up plan: 2 weeks postop Contact information:     Keonna Raether A. Amedeo [name], MD [place] [address] [place],  [place]  [address] Phone: [phone] Fax: [phone]

## 2019-12-15 DIAGNOSIS — S82891S Other fracture of right lower leg, sequela: Secondary | ICD-10-CM | POA: Diagnosis not present

## 2019-12-15 LAB — CBC
HCT: 24.7 % — ABNORMAL LOW (ref 39.0–52.0)
Hemoglobin: 8.3 g/dL — ABNORMAL LOW (ref 13.0–17.0)
MCH: 33.2 pg (ref 26.0–34.0)
MCHC: 33.6 g/dL (ref 30.0–36.0)
MCV: 98.8 fL (ref 80.0–100.0)
Platelets: 302 10*3/uL (ref 150–400)
RBC: 2.5 MIL/uL — ABNORMAL LOW (ref 4.22–5.81)
RDW: 12.2 % (ref 11.5–15.5)
WBC: 8 10*3/uL (ref 4.0–10.5)
nRBC: 0 % (ref 0.0–0.2)

## 2019-12-15 LAB — BASIC METABOLIC PANEL
Anion gap: 8 (ref 5–15)
BUN: 11 mg/dL (ref 8–23)
CO2: 25 mmol/L (ref 22–32)
Calcium: 8.2 mg/dL — ABNORMAL LOW (ref 8.9–10.3)
Chloride: 95 mmol/L — ABNORMAL LOW (ref 98–111)
Creatinine, Ser: 0.53 mg/dL — ABNORMAL LOW (ref 0.61–1.24)
GFR, Estimated: >60 mL/min (ref >60)
Glucose, Bld: 103 mg/dL — ABNORMAL HIGH (ref 70–99)
Potassium: 4.1 mmol/L (ref 3.5–5.1)
Sodium: 128 mmol/L — ABNORMAL LOW (ref 135–145)

## 2019-12-15 MED ORDER — SODIUM CHLORIDE 0.9 % IV SOLN
1.0000 g | INTRAVENOUS | Status: AC
  Administered 2019-12-15 – 2019-12-17 (×3): 1 g via INTRAVENOUS
  Filled 2019-12-15 (×3): qty 10

## 2019-12-15 NOTE — Care Management Important Message (Signed)
Important Message  Patient Details  Name: Lee Wang MRN: [id number] Date of Birth: 02-01-1946   Medicare Important Message Given:  Yes     Tommy Medal 12/15/2019, 4:09 PM

## 2019-12-15 NOTE — TOC Initial Note (Signed)
Transition of Care [facility]) - Initial/Assessment Note    Patient Details  Name: Lee Wang MRN: [id number] Date of Birth: [birth year]  Transition of Care [facility] CM/SW Contact:    Shade Flood, LCSW Phone Number: 12/15/2019, 11:52 AM  Clinical Narrative:                  Pt admitted from [facility]. Pt was in short term rehab there. Attempted to speak with pt today though pt did not want to participate in assessment.   Spoke with pt's daughter. At this time, plan appears to be for pt to transfer to [facility] early next week for further intervention. Pt's daughter aware that [facility] will follow up with pt and family for dc planning needs once pt is stable.  Family did not have any questions at this time.  TOC will follow.  Expected Discharge Plan: Skilled Nursing Facility Barriers to Discharge: Continued Medical Work up   Patient Goals and CMS Choice        Expected Discharge Plan and Services Expected Discharge Plan: [place] In-house Referral: Clinical Social Work     Living arrangements for the past 2 months: [place], [place]                                      Prior Living Arrangements/Services Living arrangements for the past 2 months: [place], [place] Lives with:: Self Patient language and need for interpreter reviewed:: Yes Do you feel safe going back to the place where you live?: Yes      Need for Family Participation in Patient Care: Yes (Comment) Care giver support system in place?: Yes (comment)   Criminal Activity/Legal Involvement Pertinent to Current Situation/Hospitalization: No - Comment as needed  Activities of Daily Living Home Assistive Devices/Equipment: Dentures (specify type), Cane (specify quad or straight), Walker (specify type), Wheelchair ADL Screening (condition at time of admission) Patient's cognitive ability adequate to safely complete daily  activities?: Yes Is the patient deaf or have difficulty hearing?: Yes Does the patient have difficulty seeing, even when wearing glasses/contacts?: No Does the patient have difficulty concentrating, remembering, or making decisions?: No Patient able to express need for assistance with ADLs?: Yes Does the patient have difficulty dressing or bathing?: No Independently performs ADLs?: Yes (appropriate for developmental age) Does the patient have difficulty walking or climbing stairs?: Yes Weakness of Legs: Right Weakness of Arms/Hands: None  Permission Sought/Granted                  Emotional Assessment       Orientation: : Oriented to Self, Oriented to Place, Oriented to  Time, Oriented to Situation Alcohol / Substance Use: Not Applicable Psych Involvement: No (comment)  Admission diagnosis:  Closed right ankle fracture, sequela [S82.891S] Patient Active Problem List   Diagnosis Date Noted   Closed right ankle fracture, sequela 12/13/2019   Exposed orthopaedic hardware ([place]) 12/13/2019   Avulsion fracture of metatarsal bone of right foot with delayed healing 11/20/2019   Left rib fracture 11/20/2019   Hyponatremia 11/20/2019   Alcohol dependence ([place]) 11/20/2019   Closed right ankle fracture 11/20/2019   Tobacco abuse 11/20/2019   Closed trimalleolar fracture of right ankle    Greater tuberosity of humerus fracture 10/14/2016   Closed fracture dislocation of joint of left shoulder girdle 10/08/2016   GAD (generalized anxiety  disorder) 03/09/2015   Elevated LFTs 11/05/2014   Abnormal [place] (ultrasound) of abdomen 11/05/2014   Hepatomegaly 11/05/2014   History of colonic polyps 11/05/2014   HTN (hypertension) 05/28/2010   Hyperlipemia 05/28/2010   Depression with anxiety 05/28/2010   Prostate CA ([place]) 05/28/2010   BPH (benign prostatic hyperplasia) 05/28/2010   Alcohol abuse 07/18/2009   HEMATOCHEZIA 07/18/2009   DIARRHEA 07/18/2009   ABDOMINAL  PAIN, UNSPECIFIED SITE 07/18/2009   PCP:  Patient, No Pcp Per Pharmacy:   [facility] [address] [place] [address] Phone: [phone] Fax: [phone]     Social Determinants of Health (SDOH) Interventions    Readmission Risk Interventions Readmission Risk Prevention Plan 12/15/2019  Transportation Screening Complete  Medication Review (RN CM) Complete  Some recent data might be hidden

## 2019-12-15 NOTE — Plan of Care (Signed)

## 2019-12-15 NOTE — Plan of Care (Signed)
  Problem: Acute Rehab PT Goals(only PT should resolve) Goal: Pt Will Go Supine/Side To Sit Outcome: Progressing Flowsheets (Taken 12/15/2019 1028) Pt will go Supine/Side to Sit: Independently Goal: Patient Will Transfer Sit To/From Stand Outcome: Progressing Flowsheets (Taken 12/15/2019 1028) Patient will transfer sit to/from stand: with min guard assist Goal: Pt Will Transfer Bed To Chair/Chair To Bed Outcome: Progressing Flowsheets (Taken 12/15/2019 1028) Pt will Transfer Bed to Chair/Chair to Bed: min guard assist Goal: Pt Will Ambulate Outcome: Progressing Flowsheets (Taken 12/15/2019 1028) Pt will Ambulate:  50 feet  with min guard assist  with rolling walker   10:28 AM, 12/15/19 Lonell Grandchild, MPT Physical Therapist with [facility] 336 [phone] office [phone] mobile phone

## 2019-12-15 NOTE — Progress Notes (Signed)
   ORTHOPAEDIC PROGRESS NOTE  Scheduled for:  R ankle I&D, HARDWARE REMOVAL and Ex-Fix R ankle  DOS: 12/14/19  SUBJECTIVE: Lost IV access over night.  No issues otherwise.  Pain is controlled.  Denies chest pain, shortness of breath.    OBJECTIVE: PE:  Vitals:   12/15/19 0359 12/15/19 0820  BP: 109/63 118/79  Pulse: 83 89  Resp: 18 18  Temp: 98.9 F (37.2 C) 98.6 F (37 C)  SpO2: 100% 100%   Alert this morning.  No acute distress  RLE splint is intact; strikethrough blood on medial and lateral splint DP pulse dopplered Toes WWP Sensation intact distally  ASSESSMENT: Lee Wang is a 74 y.o. male, stable with splinting; plan to transfer patient to [facility] early next week for further care to be provided by Dr. Meridee Score  PLAN: Weightbearing: NWB RLE Insicional and dressing care: Reinforce dressings as needed Orthopedic device(s): External fixator to remain in place postop VTE prophylaxis: Aspirin 81mg  BID for 6 weeks, to resume postop Pain control: PO medications PRN Follow - up plan: 2 weeks postop Will continue to provide Ancef through the weekend to prevent infection given exposed hardware and complicated clinical/surgical course Contact information:     [name] A. Amedeo [name], MD [place] [address] [place],  [place]  [address] Phone: [phone] Fax: [phone]

## 2019-12-15 NOTE — Plan of Care (Signed)
  Problem: Acute Rehab OT Goals (only OT should resolve) Goal: Pt. Will Perform Upper Body Dressing Flowsheets (Taken 12/15/2019 0832) Pt Will Perform Upper Body Dressing:  with modified independence  sitting Goal: Pt. Will Perform Lower Body Dressing Flowsheets (Taken 12/15/2019 0832) Pt Will Perform Lower Body Dressing:  with supervision  sitting/lateral leans  sit to/from stand Goal: Pt. Will Transfer To Toilet Flowsheets (Taken 12/15/2019 [phone]) Pt Will Transfer to Toilet:  with supervision  stand pivot transfer  ambulating  regular height toilet  bedside commode Goal: Pt. Will Perform Toileting-Clothing Manipulation Flowsheets (Taken 12/15/2019 0832) Pt Will Perform Toileting - Clothing Manipulation and hygiene:  with supervision  sitting/lateral leans  sit to/from stand Goal: Pt/Caregiver Will Perform Home Exercise Program Flowsheets (Taken 12/15/2019 [phone]) Pt/caregiver will Perform Home Exercise Program:  Increased strength  Both right and left upper extremity  Independently  With written HEP provided

## 2019-12-15 NOTE — Evaluation (Signed)
Occupational Therapy Evaluation Patient Details Name: Lee Wang MRN: [id number] DOB: 04/27/1945 Today's Date: 12/15/2019    History of Present Illness Removal of hardware from right ankle, irrigation and debridement, attempted placement of external fixation and splint application   Clinical Impression   Pt agreeable to OT evaluation this am, reports pain in RLE. Pt from home, reports no rehab services after last hospital stay. Pt continues to be NWB, generalized weakness limiting ability to perform mobility tasks and ADLs. Pt requiring set-up for seated ADLs, increased assistance to mod for ADLs requiring standing. Recommend SNF on discharge, pt reports he prefers to go home. Educated on benefit of rehab for strengthening and safety during ADLs, discussed [place] services if pt declines SNF. Will continue to follow acutely.    Follow Up Recommendations  SNF;Home health OT    Equipment Recommendations  3 in 1 bedside commode;Tub/shower seat       Precautions / Restrictions Precautions Precautions: Fall Restrictions Weight Bearing Restrictions: Yes RLE Weight Bearing: Non weight bearing      Mobility Bed Mobility Overal bed mobility: Modified Independent                  Transfers Overall transfer level: Needs assistance Equipment used: Rolling walker (2 wheeled)             General transfer comment: Defer to PT note        ADL either performed or assessed with clinical judgement   ADL Overall ADL's : Needs assistance/impaired Eating/Feeding: Modified independent;Sitting   Grooming: Set up;Sitting Grooming Details (indicate cue type and reason): pt performing tasks in sitting as he is unable to maintain standing with NWB and perform tasks Upper Body Bathing: Set up;Sitting   Lower Body Bathing: Moderate assistance;Sit to/from stand   Upper Body Dressing : Minimal assistance;Sitting Upper Body Dressing Details (indicate cue type and reason): set-up of  gown and assist for tying and buttoning     Toilet Transfer: Minimal assistance;RW Toilet Transfer Details (indicate cue type and reason): simulated with bed to chair transfer           General ADL Comments: pt requiring increased assistance due to RLE pain, weakness, and NWB status     Vision Baseline Vision/History: No visual deficits Patient Visual Report: No change from baseline Vision Assessment?: No apparent visual deficits            Pertinent Vitals/Pain Pain Assessment: 0-10 Pain Score: 6  Pain Location: right ankle Pain Descriptors / Indicators: Discomfort;Grimacing Pain Intervention(s): Limited activity within patient's tolerance;Monitored during session;Repositioned     Hand Dominance Right   Extremity/Trunk Assessment Upper Extremity Assessment Upper Extremity Assessment: Generalized weakness   Lower Extremity Assessment Lower Extremity Assessment: Defer to PT evaluation       Communication Communication Communication: No difficulties   Cognition Arousal/Alertness: Awake/alert Behavior During Therapy: WFL for tasks assessed/performed Overall Cognitive Status: Within Functional Limits for tasks assessed                                                Home Living Family/patient expects to be discharged to:: Skilled nursing facility Living Arrangements: Alone Available Help at Discharge: Family;Available PRN/intermittently Type of Home: Mobile home Home Access: Stairs to enter Entrance Stairs-Number of Steps: 3 Entrance Stairs-Rails: None Home Layout: One level     Bathroom Shower/Tub: Tub/shower  unit   Bathroom Toilet: Standard     Home Equipment: Walker - 2 wheels          Prior Functioning/Environment Level of Independence: Needs assistance  Gait / Transfers Assistance Needed: Pt using wheelchair or RW for mobility ADL's / Homemaking Assistance Needed: Son assisting occasionally with ADLs, I/ADLs             OT Problem List: Decreased activity tolerance;Impaired balance (sitting and/or standing);Decreased safety awareness;Decreased knowledge of use of DME or AE;Pain      OT Treatment/Interventions: Self-care/ADL training;Therapeutic exercise;DME and/or AE instruction;Therapeutic activities;Patient/family education    OT Goals(Current goals can be found in the care plan section) Acute Rehab OT Goals Patient Stated Goal: return home with family to assist OT Goal Formulation: With patient Time For Goal Achievement: 12/29/19 Potential to Achieve Goals: Good  OT Frequency: Min 2X/week   Barriers to D/C: Decreased caregiver support                End of Session Equipment Utilized During Treatment: Rolling walker;Gait belt Nurse Communication: Other (comment) (IV out)  Activity Tolerance: Patient tolerated treatment well Patient left: in chair;with call bell/phone within reach;with chair alarm set  OT Visit Diagnosis: Muscle weakness (generalized) (M62.81);Pain Pain - Right/Left: Right Pain - part of body: Ankle and joints of foot                Time: 0742-0820 OT Time Calculation (min): 38 min Charges:  OT General Charges $OT Visit: 1 Visit OT Evaluation $OT Eval Moderate Complexity: [place], OTR/L  [phone] 12/15/2019, 8:29 AM

## 2019-12-15 NOTE — Progress Notes (Signed)
Patient Demographics:    Lee Wang, is a 74 y.o. male, DOB - 1946-01-27, IWL:[id number]  Admit date - 12/13/2019   Admitting Physician Mordecai Rasmussen, MD  Outpatient Primary MD for the patient is Patient, No Pcp Per  LOS - 2   No chief complaint on file.       Subjective:    [name] today has no fevers, no emesis,  No chest pain,   - No bleeding concerns,    Assessment  & Plan :    Principal Problem:   Closed right ankle fracture, sequela Active Problems:   Alcohol abuse   HTN (hypertension)   Depression with anxiety   Prostate CA (HCC)   BPH (benign prostatic hyperplasia)   Hyponatremia   Alcohol dependence (HCC)   Closed right ankle fracture   Tobacco abuse  Brief Summary:- -74 year old male with past medical history relevant for h/o Etoh abuse, BPH, prostate cancer , tobacco abuse, HLD and HTN who had a witnessed mechanical fall after missing a step on 11/19/2019 subsequently imaging studies demonstrated fracture dislocation of the ankle and acute fracture (avulsion) through the base of the fifth metatarsal, he underwent Closed reduction and splinting of bimalleolar right ankle fracture on 11/20/19, on 11/30/2019 patient underwent- Removal of pre-existing plate and screws from the fibula with revision ORIF of the fibula with a locking plate.  ORIF of the vertical shear fracture of the medial malleolus -On 12/14/2019 patient was found to have failure of hardware with essentially conversion of his close fractures and open fracture, on 12/14/19 patient underwent Removal of hardware from right ankle, irrigation and debridement,  unsuccessful attempt at placement of external fixation and splint application  A/p 1)Symptomatic Hyponatremia--patient with history of alcohol abuse as well as history of excessive free water intake -Sodium is currently up to 128 from 124 -Lexapro may be  contributing to patient's hyponatremia  2)RT Ankle Fracture with failed hardware---On 12/14/2019 patient was found to have failure of hardware with essentially conversion of his closed fractures and open fracture, on 12/14/19 patient underwent Removal of hardware from right ankle, irrigation and debridement,  unsuccessful attempt at placement of external fixation and splint application -Will continue to provide antibiotics/Rocephin through the weekend as per cellulitis order set given exposed hardware and complicated clinical/surgical course -further Rx per orthopedic team -Plan is for patient to be transferred to [facility] for ankle fusion by Dr. Sharol Given  3)HTN--stable, c/n propranolol ER 60 mg in a.m., IV labetalol  as needed elevated BP  4)Depression/Anxiety--May use Xanax as needed, okay to restart Lexapro at 10 mg daily  5)Alcohol and Tobacco Abuse--continue patch as ordered, multivitamin -Low risk for DTs since no recent EtOH use  6) acute on chronic anemia--H&H drop is most likely due to postop blood loss compounded by hemodilution from IV fluids for hyponatremia -Recent B12 and folate were not low, check ferritin and serum iron TIBC -Check stool for occult blood  Disposition/Need for in-Hospital Stay- patient unable to be discharged at this time due to --unstable right ankle/failed hardware with significant ambulatory dysfunction requiring further orthopedic intervention, as well as HypoNatremia requiring IV fluid --Plan is for patient to be transferred to [facility] for ankle fusion by Dr. Sharol Given  Status is: Inpatient  Remains inpatient appropriate because:unstable right ankle/failed hardware with significant ambulatory dysfunction requiring further orthopedic intervention, as well as HypoNatremia requiring IV fluid   Disposition: The patient is from: SNF              Anticipated d/c is to: SNF              Anticipated d/c date is: > 3 days              Patient  currently is not medically stable to d/c. Barriers: Not Clinically Stable- unstable right ankle/failed hardware with significant ambulatory dysfunction requiring further orthopedic intervention, as well as HypoNatremia requiring IV fluid -Plan is for patient to be transferred to [facility] for ankle fusion by Dr. Sharol Given  Code Status : Full code  Family Communication:    (patient is alert, awake and coherent)  -Discussed with daughter-- Leslie---[phone] Consults  :  ortho  DVT Prophylaxis  :    SCDs /Aspirin  Lab Results  Component Value Date   PLT 302 12/15/2019   Inpatient Medications  Scheduled Meds:  aspirin EC  81 mg Oral BID   atorvastatin  40 mg Oral Daily   folic acid  1 mg Oral Daily   multivitamin with minerals  1 tablet Oral Daily   propranolol ER  60 mg Oral Daily   thiamine  100 mg Oral Daily   Or   thiamine  100 mg Intravenous Daily   Continuous Infusions:  sodium chloride 75 mL/hr at 12/15/19 1559   cefTRIAXone (ROCEPHIN)  IV 1 g (12/15/19 1214)   PRN Meds:.  Anti-infectives (From admission, onward)   Start     Dose/Rate Route Frequency Ordered Stop   12/15/19 0900  cefTRIAXone (ROCEPHIN) 1 g in sodium chloride 0.9 % 100 mL IVPB        1 g 200 mL/hr over 30 Minutes Intravenous Every 24 hours 12/15/19 0803 12/18/19 0859   12/14/19 1400  ceFAZolin (ANCEF) IVPB 2g/100 mL premix        2 g 200 mL/hr over 30 Minutes Intravenous Every 8 hours 12/14/19 1158 12/15/19 0534   12/14/19 0600  ceFAZolin (ANCEF) IVPB 2g/100 mL premix        2 g 200 mL/hr over 30 Minutes Intravenous On call to O.R. 12/13/19 1316 12/14/19 0807   12/13/19 1415  ceFAZolin (ANCEF) IVPB 2g/100 mL premix        2 g 200 mL/hr over 30 Minutes Intravenous Every 8 hours 12/13/19 1315 12/14/19 0607        Objective:   Vitals:   12/14/19 2356 12/15/19 0359 12/15/19 0820 12/15/19 1336  BP: 112/73 109/63 118/79 (!) 124/47  Pulse: 84 83 89 81  Resp: 18 18 18 18   Temp:  97.7 F (36.5 C) 98.9 F (37.2 C) 98.6 F (37 C) 97.7 F (36.5 C)  TempSrc: Oral Oral Oral Oral  SpO2: 99% 100% 100% 100%  Weight:      Height:        Wt Readings from Last 3 Encounters:  12/13/19 56.2 kg  11/30/19 63.1 kg  11/29/19 63.1 kg    Intake/Output Summary (Last 24 hours) at 12/15/2019 1833 Last data filed at 12/15/2019 1346 Gross per 24 hour  Intake 240 ml  Output --  Net 240 ml   Physical Exam Gen:- Awake Alert,  In no apparent distress  HEENT:- [place].AT, No sclera icterus Neck-Supple Neck,No JVD,  Lungs-  CTAB , fair symmetrical  air movement CV- S1, S2 normal, regular  Abd-  +ve B.Sounds, Abd Soft, No tenderness,    Extremity/Skin:-  pedal pulses present  Psych-affect is appropriate, oriented x3 Neuro-no new focal deficits, no tremors MSK-right leg dressing wraps intact   Data Review:   Micro Results Recent Results (from the past 240 hour(s))  Respiratory Panel by RT PCR (Flu A&B, Covid) - Nasopharyngeal Swab     Status: None   Collection Time: 12/13/19  1:15 PM   Specimen: Nasopharyngeal Swab  Result Value Ref Range Status   SARS Coronavirus 2 by RT PCR NEGATIVE NEGATIVE Final    Comment: (NOTE) SARS-CoV-2 target nucleic acids are NOT DETECTED.  The SARS-CoV-2 RNA is generally detectable in upper respiratoy specimens during the acute phase of infection. The lowest concentration of SARS-CoV-2 viral copies this assay can detect is 131 copies/mL. A negative result does not preclude SARS-Cov-2 infection and should not be used as the sole basis for treatment or other patient management decisions. A negative result may occur with  improper specimen collection/handling, submission of specimen other than nasopharyngeal swab, presence of viral mutation(s) within the areas targeted by this assay, and inadequate number of viral copies (<131 copies/mL). A negative result must be combined with clinical observations, patient history, and epidemiological  information. The expected result is Negative.  Fact Sheet for Patients:  PinkCheek.be  Fact Sheet for Healthcare Providers:  GravelBags.it  This test is no t yet approved or cleared by the [place] FDA and  has been authorized for detection and/or diagnosis of SARS-CoV-2 by FDA under an Emergency Use Authorization (EUA). This EUA will remain  in effect (meaning this test can be used) for the duration of the COVID-19 declaration under Section 564(b)(1) of the Act, 21 U.S.C. section 360bbb-3(b)(1), unless the authorization is terminated or revoked sooner.     Influenza A by PCR NEGATIVE NEGATIVE Final   Influenza B by PCR NEGATIVE NEGATIVE Final    Comment: (NOTE) The Xpert Xpress SARS-CoV-2/FLU/RSV assay is intended as an aid in  the diagnosis of influenza from Nasopharyngeal swab specimens and  should not be used as a sole basis for treatment. Nasal washings and  aspirates are unacceptable for Xpert Xpress SARS-CoV-2/FLU/RSV  testing.  Fact Sheet for Patients: PinkCheek.be  Fact Sheet for Healthcare Providers: GravelBags.it  This test is not yet approved or cleared by the [place] FDA and  has been authorized for detection and/or diagnosis of SARS-CoV-2 by  FDA under an Emergency Use Authorization (EUA). This EUA will remain  in effect (meaning this test can be used) for the duration of the  Covid-19 declaration under Section 564(b)(1) of the Act, 21  U.S.C. section 360bbb-3(b)(1), unless the authorization is  terminated or revoked. Performed at [facility], [address], [place], [place] [address]   Resp Panel by RT PCR (RSV, Flu A&B, Covid) -     Status: Abnormal   Collection Time: 12/13/19  1:15 PM  Result Value Ref Range Status   SARS Coronavirus 2 by RT PCR NEGATIVE NEGATIVE Final    Comment: (NOTE) SARS-CoV-2 target nucleic acids are NOT  DETECTED.  The SARS-CoV-2 RNA is generally detectable in upper respiratoy specimens during the acute phase of infection. The lowest concentration of SARS-CoV-2 viral copies this assay can detect is 131 copies/mL. A negative result does not preclude SARS-Cov-2 infection and should not be used as the sole basis for treatment or other patient management decisions. A negative result may occur with  improper specimen collection/handling, submission of specimen other than nasopharyngeal swab, presence of viral mutation(s) within the areas targeted by this assay, and inadequate number of viral copies (<131 copies/mL). A negative result must be combined with clinical observations, patient history, and epidemiological information. The expected result is Negative.  Fact Sheet for Patients:  PinkCheek.be  Fact Sheet for Healthcare Providers:  GravelBags.it  This test is no t yet approved or cleared by the [place] FDA and  has been authorized for detection and/or diagnosis of SARS-CoV-2 by FDA under an Emergency Use Authorization (EUA). This EUA will remain  in effect (meaning this test can be used) for the duration of the COVID-19 declaration under Section 564(b)(1) of the Act, 21 U.S.C. section 360bbb-3(b)(1), unless the authorization is terminated or revoked sooner.     Influenza A by PCR NEGATIVE NEGATIVE Final   Influenza B by PCR NEGATIVE NEGATIVE Final    Comment: (NOTE) The Xpert Xpress SARS-CoV-2/FLU/RSV assay is intended as an aid in  the diagnosis of influenza from Nasopharyngeal swab specimens and  should not be used as a sole basis for treatment. Nasal washings and  aspirates are unacceptable for Xpert Xpress SARS-CoV-2/FLU/RSV  testing.  Fact Sheet for Patients: PinkCheek.be  Fact Sheet for Healthcare Providers: GravelBags.it  This test is not yet  approved or cleared by the [place] FDA and  has been authorized for detection and/or diagnosis of SARS-CoV-2 by  FDA under an Emergency Use Authorization (EUA). This EUA will remain  in effect (meaning this test can be used) for the duration of the  Covid-19 declaration under Section 564(b)(1) of the Act, 21  U.S.C. section 360bbb-3(b)(1), unless the authorization is  terminated or revoked.    Respiratory Syncytial Virus by PCR POSITIVE (A) NEGATIVE Final    Comment: (NOTE) Fact Sheet for Patients: PinkCheek.be  Fact Sheet for Healthcare Providers: GravelBags.it  This test is not yet approved or cleared by the [place] FDA and  has been authorized for detection and/or diagnosis of SARS-CoV-2 by  FDA under an Emergency Use Authorization (EUA). This EUA will remain  in effect (meaning this test can be used) for the duration of the  COVID-19 declaration under Section 564(b)(1) of the Act, 21 U.S.C.  section 360bbb-3(b)(1), unless the authorization is terminated or  revoked. Performed at [facility], [address], [place], [place] [address]   Surgical pcr screen     Status: None   Collection Time: 12/13/19  3:58 PM   Specimen: Nasal Mucosa; Nasal Swab  Result Value Ref Range Status   MRSA, PCR NEGATIVE NEGATIVE Final   Staphylococcus aureus NEGATIVE NEGATIVE Final    Comment: (NOTE) The Xpert SA Assay (FDA approved for NASAL specimens in patients 10 years of age and older), is one component of a comprehensive surveillance program. It is not intended to diagnose infection nor to guide or monitor treatment. Performed at [facility], [address], [place], [place] [address]     Radiology Reports DG Ankle 2 Views Right  Result Date: 11/30/2019 CLINICAL DATA:  ORIF right ankle fracture EXAM: DG C-ARM 1-60 MIN; RIGHT ANKLE - 2 VIEW COMPARISON:  11/20/2019 FLUOROSCOPY TIME:  Fluoroscopy Time:  2 minutes Radiation  Exposure Index (if provided by the fluoroscopic device): Not available Number of Acquired Spot Images: 5 FINDINGS: Previously seen fixation sideplate is again noted along the distal fibula. Previously seen medial malleolar fracture is again noted involving the tibiotalar articulation. The subluxation of the talus was reduced and  a fixation sideplate was placed along the distal tibia with an additional fixation screw traversing the medial malleolus. New fixation screws noted traversing the fibular sideplate into the tibia. Fracture fragments are in near anatomic alignment. IMPRESSION: Progressive ORIF of distal tibial and fibular fractures. Electronically Signed   By: Inez Catalina M.D.   On: 11/30/2019 15:14   DG Ankle 2 Views Right  Result Date: 11/20/2019 CLINICAL DATA:  74 year old male with comminuted ankle fracture. Intraoperative images. EXAM: DG C-ARM 1-60 MIN; RIGHT ANKLE - 2 VIEW FLUOROSCOPY TIME:  Fluoroscopy Time:  0 minutes 2nd Radiation Exposure Index (if provided by the fluoroscopic device): Number of Acquired Spot Images: 0 COMPARISON:  Right ankle series 11/19/2019. FINDINGS: Five intraoperative fluoroscopic spot views of the right ankle demonstrate reduced comminuted fracture dislocation with near anatomic alignment of the medial malleolus fragment. A fragment of the lateral malleolus superimposed on prior distal fibula ORIF is less well visualized fluoroscopically. The hardware appears stable. IMPRESSION: Improved, near anatomic alignment of right ankle fracture dislocation. Previous distal fibula ORIF. Electronically Signed   By: Genevie Ann M.D.   On: 11/20/2019 19:19   DG Ankle Complete Right  Result Date: 12/14/2019 CLINICAL DATA:  Removal of hardware in attempted external fixation. EXAM: DG C-ARM 1-60 MIN; RIGHT ANKLE - COMPLETE 3+ VIEW FLUOROSCOPY TIME:  Fluoroscopy Time:  1 minutes 19 seconds COMPARISON:  Radiographs December 13, 2019. FINDINGS: Five C-arm fluoroscopic images were  obtained intraoperatively and submitted for post operative interpretation. These images demonstrate removal of the previously noted ankle fixation hardware. Image labeled #3 demonstrates attempted external fixation of the tibia with 2 screws. Please see the performing provider's procedural report for further detail. IMPRESSION: Intraoperative fluoroscopic images, as detailed above. Electronically Signed   By: Margaretha Sheffield MD   On: 12/14/2019 11:38   DG Ankle Complete Right  Result Date: 12/13/2019 XR of the right ankle demonstrates loss of fixation of both the medial and lateral malleoli.  Medial plate and screws has pulled out.  Ankle is similar to injury films. Impression: failure of previous ORIF, both medial and lateral malleoli  DG Ankle Complete Right  Result Date: 11/19/2019 CLINICAL DATA:  Pain EXAM: RIGHT ANKLE - COMPLETE 3+ VIEW COMPARISON:  None. FINDINGS: There is an acute displaced trimalleolar fracture with disruption of the ankle mortise. There is significant medial displacement of the talus with respect to the distal tibia. There is a large cyst displaced fracture fragment measuring 4.5 cm involving the medial malleolus. There may be a subtle impaction fracture involving the lateral articular surface of the talus. There is extensive surrounding soft tissue swelling. The patient's prior plate and screw fixation of the distal fibula is bent. There is an acute fracture through the base of the fifth metatarsal. The posterior calcaneus is suboptimally evaluated secondary to overlapping objects external to the patient. IMPRESSION: 1. Fracture dislocation of the ankle as detailed above. 2. Acute fracture through the base of the fifth metatarsal is favored to represent an avulsion fracture, however dedicated foot radiographs are recommended. Electronically Signed   By: Constance Holster M.D.   On: 11/19/2019 22:20   DG Chest Port 1 View  Result Date: 11/19/2019 CLINICAL DATA:  Fall EXAM:  PORTABLE CHEST 1 VIEW COMPARISON:  None. FINDINGS: Lungs are clear. No pneumothorax or pleural effusion. Cardiac size within normal limits. Pulmonary vascularity is normal. There are probable acute fractures of the left 6, 7, and 8 ribs posterolaterally demonstrating minimal displacement. IMPRESSION: Acute left rib fractures.  No pneumothorax. Electronically Signed   By: Fidela Salisbury MD   On: 11/19/2019 23:20   DG Ankle Right Port  Result Date: 11/30/2019 CLINICAL DATA:  Status post ORIF of right ankle fracture EXAM: PORTABLE RIGHT ANKLE - 2 VIEW COMPARISON:  11/20/2019 FINDINGS: There has been interval removal of 1 of the fixation screws in the fibular side plate with a longer screw placed traversing into the tibia. Fixation sideplate along the medial aspect of the tibia is noted with several screws. A single screws noted traversing the medial malleolus. Fracture fragments are in near anatomic alignment. Splinting material is noted. IMPRESSION: Interval progressive fixation of the distal tibial and fibular fractures. Electronically Signed   By: Inez Catalina M.D.   On: 11/30/2019 15:13   DG Ankle Right Port  Result Date: 11/20/2019 CLINICAL DATA:  74 year old male with comminuted ankle fracture. Status post closed reduction and splinting of bimalleolar right ankle fracture. EXAM: PORTABLE RIGHT ANKLE - 2 VIEW COMPARISON:  Intraoperative images today. Right ankle series 11/19/2019. FINDINGS: Previous distal right fibula ORIF. Splint or cast material now in place. Substantially improved alignment of right ankle fracture dislocation compared to yesterday. On these images there is residual mild displacement and distraction of the medial malleolus fracture. No new osseous injury identified. IMPRESSION: Closed reduction of comminuted right ankle fracture dislocation. Prior distal fibula ORIF. Electronically Signed   By: Genevie Ann M.D.   On: 11/20/2019 19:21   DG C-Arm 1-60 Min  Result Date:  12/14/2019 CLINICAL DATA:  Removal of hardware in attempted external fixation. EXAM: DG C-ARM 1-60 MIN; RIGHT ANKLE - COMPLETE 3+ VIEW FLUOROSCOPY TIME:  Fluoroscopy Time:  1 minutes 19 seconds COMPARISON:  Radiographs December 13, 2019. FINDINGS: Five C-arm fluoroscopic images were obtained intraoperatively and submitted for post operative interpretation. These images demonstrate removal of the previously noted ankle fixation hardware. Image labeled #3 demonstrates attempted external fixation of the tibia with 2 screws. Please see the performing provider's procedural report for further detail. IMPRESSION: Intraoperative fluoroscopic images, as detailed above. Electronically Signed   By: Margaretha Sheffield MD   On: 12/14/2019 11:38   DG C-Arm 1-60 Min  Result Date: 11/30/2019 CLINICAL DATA:  ORIF right ankle fracture EXAM: DG C-ARM 1-60 MIN; RIGHT ANKLE - 2 VIEW COMPARISON:  11/20/2019 FLUOROSCOPY TIME:  Fluoroscopy Time:  2 minutes Radiation Exposure Index (if provided by the fluoroscopic device): Not available Number of Acquired Spot Images: 5 FINDINGS: Previously seen fixation sideplate is again noted along the distal fibula. Previously seen medial malleolar fracture is again noted involving the tibiotalar articulation. The subluxation of the talus was reduced and a fixation sideplate was placed along the distal tibia with an additional fixation screw traversing the medial malleolus. New fixation screws noted traversing the fibular sideplate into the tibia. Fracture fragments are in near anatomic alignment. IMPRESSION: Progressive ORIF of distal tibial and fibular fractures. Electronically Signed   By: Inez Catalina M.D.   On: 11/30/2019 15:14   DG C-Arm 1-60 Min  Result Date: 11/20/2019 CLINICAL DATA:  74 year old male with comminuted ankle fracture. Intraoperative images. EXAM: DG C-ARM 1-60 MIN; RIGHT ANKLE - 2 VIEW FLUOROSCOPY TIME:  Fluoroscopy Time:  0 minutes 2nd Radiation Exposure Index (if  provided by the fluoroscopic device): Number of Acquired Spot Images: 0 COMPARISON:  Right ankle series 11/19/2019. FINDINGS: Five intraoperative fluoroscopic spot views of the right ankle demonstrate reduced comminuted fracture dislocation with near anatomic alignment of the medial malleolus fragment. A fragment of the  lateral malleolus superimposed on prior distal fibula ORIF is less well visualized fluoroscopically. The hardware appears stable. IMPRESSION: Improved, near anatomic alignment of right ankle fracture dislocation. Previous distal fibula ORIF. Electronically Signed   By: Genevie Ann M.D.   On: 11/20/2019 19:19     CBC Recent Labs  Lab 12/13/19 1341 12/15/19 0938  WBC 10.3 8.0  HGB 11.2* 8.3*  HCT 33.4* 24.7*  PLT 323 302  MCV 96.8 98.8  MCH 32.5 33.2  MCHC 33.5 33.6  RDW 12.2 12.2    Chemistries  Recent Labs  Lab 12/13/19 1341 12/14/19 0511 12/15/19 0715  NA 124* 126* 128*  K 4.0 4.5 4.1  CL 91* 94* 95*  CO2 25 25 25   GLUCOSE 94 93 103*  BUN 12 10 11   CREATININE 0.65 0.59* 0.53*  CALCIUM 8.5* 8.5* 8.2*   ------------------------------------------------------------------------------------------------------------------ No results for input(s): CHOL, HDL, LDLCALC, TRIG, CHOLHDL, LDLDIRECT in the last 72 hours.  No results found for: HGBA1C ------------------------------------------------------------------------------------------------------------------ No results for input(s): TSH, T4TOTAL, T3FREE, THYROIDAB in the last 72 hours.  Invalid input(s): FREET3 ------------------------------------------------------------------------------------------------------------------ No results for input(s): VITAMINB12, FOLATE, FERRITIN, TIBC, IRON, RETICCTPCT in the last 72 hours.  Coagulation profile No results for input(s): INR, PROTIME in the last 168 hours.  No results for input(s): DDIMER in the last 72 hours.  Cardiac Enzymes No results for input(s): CKMB, TROPONINI,  MYOGLOBIN in the last 168 hours.  Invalid input(s): CK ------------------------------------------------------------------------------------------------------------------ No results found for: BNP   Roxan Hockey M.D on 12/15/2019 at 6:33 PM  Go to [email or web address] - for contact info  [facility] - Office  [phone]

## 2019-12-15 NOTE — Evaluation (Signed)
Physical Therapy Evaluation Patient Details Name: Lee Wang MRN: [id number] DOB: 07/13/1945 Today's Date: 12/15/2019   History of Present Illness  Lee Plate L. Wang is a 74 y/o male  s/p Removal of hardware from right ankle, irrigation and debridement, attempted placement of external fixation and splint application on 70/96/28    Clinical Impression  Patient limited to a few unsteady labored steps at bedside with fair/good return for keeping RLE off floor, had difficulty using RW due to generalized weakness and c/o fatigue with near loss of balance during transfer to chair.  Patient tolerated sitting up in chair after therapy - nursing staff notified.  Patient will benefit from continued physical therapy in hospital and recommended venue below to increase strength, balance, endurance for safe ADLs and gait.     Follow Up Recommendations SNF;Supervision for mobility/OOB;Supervision - Intermittent    Equipment Recommendations  None recommended by PT    Recommendations for Other Services       Precautions / Restrictions Precautions Precautions: Fall Restrictions Weight Bearing Restrictions: Yes RLE Weight Bearing: Non weight bearing      Mobility  Bed Mobility Overal bed mobility: Modified Independent                  Transfers Overall transfer level: Needs assistance Equipment used: Rolling walker (2 wheeled) Transfers: Sit to/from [facility] Sit to Stand: Min assist Stand pivot transfers: Min assist       General transfer comment: increased time, labored movement  Ambulation/Gait Ambulation/Gait assistance: Min assist;Mod assist Gait Distance (Feet): 7 Feet Assistive device: Rolling walker (2 wheeled) Gait Pattern/deviations: Decreased step length - right;Decreased step length - left;Decreased stride length;Step-to pattern Gait velocity: decreased   General Gait Details: limited to 7-8 slow labored steps with fair/good return for  maintaining NWB on RLE, required verbal cues on using RW for taking steps with fair carryover, limited mostly due to c/o fatigue  Stairs            Wheelchair Mobility    Modified Rankin (Stroke Patients Only)       Balance Overall balance assessment: Needs assistance Sitting-balance support: Feet supported;No upper extremity supported Sitting balance-Leahy Scale: Good Sitting balance - Comments: seated at EOB   Standing balance support: During functional activity;Bilateral upper extremity supported Standing balance-Leahy Scale: Fair Standing balance comment: using RW with NWB RLE                             Pertinent Vitals/Pain Pain Assessment: 0-10 Pain Score: 6  Pain Location: right ankle Pain Descriptors / Indicators: Discomfort;Grimacing Pain Intervention(s): Limited activity within patient's tolerance;Monitored during session    Home Living Family/patient expects to be discharged to:: Private residence Living Arrangements: Alone Available Help at Discharge: Family;Available PRN/intermittently Type of Home: Mobile home Home Access: Stairs to enter Entrance Stairs-Rails: None Entrance Stairs-Number of Steps: 3 Home Layout: One level Home Equipment: Walker - 2 wheels;Wheelchair - manual      Prior Function Level of Independence: Needs assistance   Gait / Transfers Assistance Needed: Uses RW for transfers and a few steps, has been using wheelchair mostly since last hospital admission  ADL's / Homemaking Assistance Needed: Son assisting occasionally with ADLs, I/ADLs        Hand Dominance   Dominant Hand: Right    Extremity/Trunk Assessment   Upper Extremity Assessment Upper Extremity Assessment: Defer to OT evaluation    Lower Extremity Assessment Lower Extremity  Assessment: Generalized weakness;RLE deficits/detail RLE Deficits / Details: grossly 3+/5 except ankle/foot not tested due to splint RLE Sensation: WNL RLE Coordination:  WNL    Cervical / Trunk Assessment Cervical / Trunk Assessment: Normal  Communication   Communication: No difficulties  Cognition Arousal/Alertness: Awake/alert Behavior During Therapy: WFL for tasks assessed/performed Overall Cognitive Status: Within Functional Limits for tasks assessed                                        General Comments      Exercises     Assessment/Plan    PT Assessment Patient needs continued PT services  PT Problem List Decreased strength;Decreased activity tolerance;Decreased balance;Decreased mobility       PT Treatment Interventions Balance training;Gait training;Stair training;Functional mobility training;DME instruction;Therapeutic activities;Therapeutic exercise;Patient/family education    PT Goals (Current goals can be found in the Care Plan section)  Acute Rehab PT Goals Patient Stated Goal: return home with family to assist PT Goal Formulation: With patient Time For Goal Achievement: 12/29/19 Potential to Achieve Goals: Good    Frequency Min 3X/week   Barriers to discharge        Co-evaluation PT/OT/SLP Co-Evaluation/Treatment: Yes Reason for Co-Treatment: Complexity of the patient's impairments (multi-system involvement);To address functional/ADL transfers PT goals addressed during session: Mobility/safety with mobility;Proper use of DME;Balance         AM-PAC PT "6 Clicks" Mobility  Outcome Measure Help needed turning from your back to your side while in a flat bed without using bedrails?: None Help needed moving from lying on your back to sitting on the side of a flat bed without using bedrails?: None Help needed moving to and from a bed to a chair (including a wheelchair)?: A Little Help needed standing up from a chair using your arms (e.g., wheelchair or bedside chair)?: A Little Help needed to walk in hospital room?: A Lot Help needed climbing 3-5 steps with a railing? : Total 6 Click Score: 17    End  of Session   Activity Tolerance: Patient tolerated treatment well;Patient limited by fatigue Patient left: in chair;with call bell/phone within reach Nurse Communication: Mobility status PT Visit Diagnosis: Unsteadiness on feet (R26.81);Other abnormalities of gait and mobility (R26.89);Muscle weakness (generalized) (M62.81)    Time: 0321-2248 PT Time Calculation (min) (ACUTE ONLY): 20 min   Charges:   PT Evaluation $PT Eval Moderate Complexity: 1 Mod PT Treatments $Therapeutic Activity: 8-22 mins        10:26 AM, 12/15/19 Lonell Grandchild, MPT Physical Therapist with [facility] [phone] office [phone] mobile phone

## 2019-12-16 DIAGNOSIS — S82891S Other fracture of right lower leg, sequela: Secondary | ICD-10-CM | POA: Diagnosis not present

## 2019-12-16 LAB — IRON AND TIBC
Iron: 24 ug/dL — ABNORMAL LOW (ref 45–182)
Saturation Ratios: 13 % — ABNORMAL LOW (ref 17.9–39.5)
TIBC: 185 ug/dL — ABNORMAL LOW (ref 250–450)
UIBC: 161 ug/dL

## 2019-12-16 LAB — FERRITIN: Ferritin: 305 ng/mL (ref 24–336)

## 2019-12-16 MED ORDER — SENNOSIDES-DOCUSATE SODIUM 8.6-50 MG PO TABS
2.0000 | ORAL_TABLET | Freq: Every day | ORAL | Status: DC
  Administered 2019-12-16: 2 via ORAL
  Filled 2019-12-16: qty 2

## 2019-12-16 MED ORDER — POLYETHYLENE GLYCOL 3350 17 G PO PACK
17.0000 g | PACK | Freq: Once | ORAL | Status: AC
  Administered 2019-12-16: 17 g via ORAL

## 2019-12-16 NOTE — Progress Notes (Signed)
Patient ID: Lee Wang, male   DOB: 08-14-1945, 74 y.o.   MRN: [id number] BP 133/65 (BP Location: Right Arm)   Pulse 60   Temp 98.7 F (37.1 C) (Oral)   Resp 14   Ht 5\' 7"  (1.702 m)   Wt 56.2 kg   SpO2 100%   BMI 19.41 kg/m   POD # 2   S/P RIGHT ANKLE REVISION ORIF W [facility]    He's stable, intermittent pain but controlled  Splint: no visible drainage   Color cap refill normal right foot   Transfer to [facility] next week

## 2019-12-16 NOTE — Progress Notes (Signed)
Patient Demographics:    Lee Wang, is a 74 y.o. male, DOB - 22-Dec-1945, FBP:[id number]  Admit date - 12/13/2019   Admitting Physician Mordecai Rasmussen, MD  Outpatient Primary MD for the patient is Patient, No Pcp Per  LOS - 3   No chief complaint on file.       Subjective:    Lee Wang today has no fevers, no emesis,  No chest pain,   - --Resting comfortably -Denies  dizziness or shob    Assessment  & Plan :    Principal Problem:   Closed right ankle fracture, sequela Active Problems:   Alcohol abuse   HTN (hypertension)   Depression with anxiety   Prostate CA (HCC)   BPH (benign prostatic hyperplasia)   Hyponatremia   Alcohol dependence (HCC)   Closed right ankle fracture   Tobacco abuse  Brief Summary:- -74 year old male with past medical history relevant for h/o Etoh abuse, BPH, prostate cancer , tobacco abuse, HLD and HTN who had a witnessed mechanical fall after missing a step on 11/19/2019 subsequently imaging studies demonstrated fracture dislocation of the ankle and acute fracture (avulsion) through the base of the fifth metatarsal, he underwent Closed reduction and splinting of bimalleolar right ankle fracture on 11/20/19, on 11/30/2019 patient underwent- Removal of pre-existing plate and screws from the fibula with revision ORIF of the fibula with a locking plate.  ORIF of the vertical shear fracture of the medial malleolus -On 12/14/2019 patient was found to have failure of hardware with essentially conversion of his close fractures and open fracture, on 12/14/19 patient underwent Removal of hardware from right ankle, irrigation and debridement,  unsuccessful attempt at placement of external fixation and splint application  A/p 1)Symptomatic Hyponatremia--patient with history of alcohol abuse as well as history of excessive free water intake -Sodium is currently up to 128 from  124 -Lexapro may be contributing to patient's hyponatremia -Recheck BMP on 12/17/2019  2)RT Ankle Fracture with failed hardware---On 12/14/2019 patient was found to have failure of hardware with essentially conversion of his closed fractures and open fracture, on 12/14/19 patient underwent Removal of hardware from right ankle, irrigation and debridement,  unsuccessful attempt at placement of external fixation and splint application -Will continue to provide antibiotics/Rocephin through the weekend as per cellulitis order set given exposed hardware and complicated clinical/surgical course -further Rx per orthopedic team -Plan is for patient to be transferred to [facility] for ankle fusion by Dr. Sharol Given  3)HTN--stable, c/n propranolol ER 60 mg in a.m., IV labetalol  as needed elevated BP  4)Depression/Anxiety--May use Xanax as needed, c/n  Lexapro at 10 mg daily  5)Alcohol and Tobacco Abuse--continue patch as ordered, multivitamin -Low risk for DTs since no recent EtOH use  6) acute on chronic anemia--H&H drop is most likely due to postop blood loss compounded by hemodilution from IV fluids for hyponatremia -Recent B12 and folate were not low,  -Serum iron is low at 24, iron saturation is low at 13, TIBC is low at 115 -Ferritin is not low most likely because patient has an acute infection/inflammation -Check stool for occult blood  Disposition/Need for in-Hospital Stay- patient unable to be discharged at this time due to --unstable right ankle/failed hardware with significant  ambulatory dysfunction requiring further orthopedic intervention, as well as HypoNatremia requiring IV fluid --Plan is for patient to be transferred to [facility] for ankle fusion by Dr. Sharol Given  Status is: Inpatient  Remains inpatient appropriate because:unstable right ankle/failed hardware with significant ambulatory dysfunction requiring further orthopedic intervention, as well as HypoNatremia requiring IV  fluid   Disposition: The patient is from: SNF              Anticipated d/c is to: SNF              Anticipated d/c date is: > 3 days              Patient currently is not medically stable to d/c. Barriers: Not Clinically Stable- unstable right ankle/failed hardware with significant ambulatory dysfunction requiring further orthopedic intervention, as well as HypoNatremia requiring IV fluid -Plan is for patient to be transferred to [facility] for ankle fusion by Dr. Sharol Given  Code Status : Full code  Family Communication:    (patient is alert, awake and coherent)  -Previously discussed with daughter-- Leslie---[phone] Consults  :  ortho  DVT Prophylaxis  :    SCDs /Aspirin  Lab Results  Component Value Date   PLT 302 12/15/2019   Inpatient Medications  Scheduled Meds: . aspirin EC  81 mg Oral BID  . atorvastatin  40 mg Oral Daily  . folic acid  1 mg Oral Daily  . multivitamin with minerals  1 tablet Oral Daily  . propranolol ER  60 mg Oral Daily  . thiamine  100 mg Oral Daily   Or  . thiamine  100 mg Intravenous Daily   Continuous Infusions: . sodium chloride 40 mL/hr at 12/16/19 0849  . cefTRIAXone (ROCEPHIN)  IV 1 g (12/16/19 0855)   PRN Meds:.  Anti-infectives (From admission, onward)   Start     Dose/Rate Route Frequency Ordered Stop   12/15/19 0900  cefTRIAXone (ROCEPHIN) 1 g in sodium chloride 0.9 % 100 mL IVPB        1 g 200 mL/hr over 30 Minutes Intravenous Every 24 hours 12/15/19 0803 12/18/19 0859   12/14/19 1400  ceFAZolin (ANCEF) IVPB 2g/100 mL premix        2 g 200 mL/hr over 30 Minutes Intravenous Every 8 hours 12/14/19 1158 12/15/19 2049   12/14/19 0600  ceFAZolin (ANCEF) IVPB 2g/100 mL premix        2 g 200 mL/hr over 30 Minutes Intravenous On call to O.R. 12/13/19 1316 12/14/19 0807   12/13/19 1415  ceFAZolin (ANCEF) IVPB 2g/100 mL premix        2 g 200 mL/hr over 30 Minutes Intravenous Every 8 hours 12/13/19 1315 12/14/19 0607         Objective:   Vitals:   12/15/19 1336 12/15/19 2000 12/16/19 0400 12/16/19 1416  BP: (!) 124/47 128/87 133/65 (!) 96/49  Pulse: 81 68 60 64  Resp: 18 16 14 17   Temp: 97.7 F (36.5 C) 98 F (36.7 C) 98.7 F (37.1 C) 98.2 F (36.8 C)  TempSrc: Oral Oral Oral Oral  SpO2: 100% 100% 100%   Weight:      Height:        Wt Readings from Last 3 Encounters:  12/13/19 56.2 kg  11/30/19 63.1 kg  11/29/19 63.1 kg    Intake/Output Summary (Last 24 hours) at 12/16/2019 1503 Last data filed at 12/16/2019 1300 Gross per 24 hour  Intake 600 ml  Output --  Net 600 ml   Physical Exam Gen:- Awake Alert,  In no apparent distress  HEENT:- [place].AT, No sclera icterus Neck-Supple Neck,No JVD,  Lungs-no wheezing, fair symmetrical air movement CV- S1, S2 normal, regular  Abd-  +ve B.Sounds, Abd Soft, No tenderness,    Psych-affect is appropriate, oriented x3 Neuro-no new focal deficits, no tremors MSK-right leg dressing wraps intact, capillary refill is good   Data Review:   Micro Results Recent Results (from the past 240 hour(s))  Respiratory Panel by RT PCR (Flu A&B, Covid) - Nasopharyngeal Swab     Status: None   Collection Time: 12/13/19  1:15 PM   Specimen: Nasopharyngeal Swab  Result Value Ref Range Status   SARS Coronavirus 2 by RT PCR NEGATIVE NEGATIVE Final    Comment: (NOTE) SARS-CoV-2 target nucleic acids are NOT DETECTED.  The SARS-CoV-2 RNA is generally detectable in upper respiratoy specimens during the acute phase of infection. The lowest concentration of SARS-CoV-2 viral copies this assay can detect is 131 copies/mL. A negative result does not preclude SARS-Cov-2 infection and should not be used as the sole basis for treatment or other patient management decisions. A negative result may occur with  improper specimen collection/handling, submission of specimen other than nasopharyngeal swab, presence of viral mutation(s) within the areas targeted by this assay, and  inadequate number of viral copies (<131 copies/mL). A negative result must be combined with clinical observations, patient history, and epidemiological information. The expected result is Negative.  Fact Sheet for Patients:  PinkCheek.be  Fact Sheet for Healthcare Providers:  GravelBags.it  This test is no t yet approved or cleared by the [place] FDA and  has been authorized for detection and/or diagnosis of SARS-CoV-2 by FDA under an Emergency Use Authorization (EUA). This EUA will remain  in effect (meaning this test can be used) for the duration of the COVID-19 declaration under Section 564(b)(1) of the Act, 21 U.S.C. section 360bbb-3(b)(1), unless the authorization is terminated or revoked sooner.     Influenza A by PCR NEGATIVE NEGATIVE Final   Influenza B by PCR NEGATIVE NEGATIVE Final    Comment: (NOTE) The Xpert Xpress SARS-CoV-2/FLU/RSV assay is intended as an aid in  the diagnosis of influenza from Nasopharyngeal swab specimens and  should not be used as a sole basis for treatment. Nasal washings and  aspirates are unacceptable for Xpert Xpress SARS-CoV-2/FLU/RSV  testing.  Fact Sheet for Patients: PinkCheek.be  Fact Sheet for Healthcare Providers: GravelBags.it  This test is not yet approved or cleared by the [place] FDA and  has been authorized for detection and/or diagnosis of SARS-CoV-2 by  FDA under an Emergency Use Authorization (EUA). This EUA will remain  in effect (meaning this test can be used) for the duration of the  Covid-19 declaration under Section 564(b)(1) of the Act, 21  U.S.C. section 360bbb-3(b)(1), unless the authorization is  terminated or revoked. Performed at [facility], [address], [place], [place] [address]   Resp Panel by RT PCR (RSV, Flu A&B, Covid) -     Status: Abnormal   Collection Time: 12/13/19  1:15  PM  Result Value Ref Range Status   SARS Coronavirus 2 by RT PCR NEGATIVE NEGATIVE Final    Comment: (NOTE) SARS-CoV-2 target nucleic acids are NOT DETECTED.  The SARS-CoV-2 RNA is generally detectable in upper respiratoy specimens during the acute phase of infection. The lowest concentration of SARS-CoV-2 viral copies this assay can detect is 131 copies/mL. A negative result does  not preclude SARS-Cov-2 infection and should not be used as the sole basis for treatment or other patient management decisions. A negative result may occur with  improper specimen collection/handling, submission of specimen other than nasopharyngeal swab, presence of viral mutation(s) within the areas targeted by this assay, and inadequate number of viral copies (<131 copies/mL). A negative result must be combined with clinical observations, patient history, and epidemiological information. The expected result is Negative.  Fact Sheet for Patients:  PinkCheek.be  Fact Sheet for Healthcare Providers:  GravelBags.it  This test is no t yet approved or cleared by the [place] FDA and  has been authorized for detection and/or diagnosis of SARS-CoV-2 by FDA under an Emergency Use Authorization (EUA). This EUA will remain  in effect (meaning this test can be used) for the duration of the COVID-19 declaration under Section 564(b)(1) of the Act, 21 U.S.C. section 360bbb-3(b)(1), unless the authorization is terminated or revoked sooner.     Influenza A by PCR NEGATIVE NEGATIVE Final   Influenza B by PCR NEGATIVE NEGATIVE Final    Comment: (NOTE) The Xpert Xpress SARS-CoV-2/FLU/RSV assay is intended as an aid in  the diagnosis of influenza from Nasopharyngeal swab specimens and  should not be used as a sole basis for treatment. Nasal washings and  aspirates are unacceptable for Xpert Xpress SARS-CoV-2/FLU/RSV  testing.  Fact Sheet for  Patients: PinkCheek.be  Fact Sheet for Healthcare Providers: GravelBags.it  This test is not yet approved or cleared by the [place] FDA and  has been authorized for detection and/or diagnosis of SARS-CoV-2 by  FDA under an Emergency Use Authorization (EUA). This EUA will remain  in effect (meaning this test can be used) for the duration of the  Covid-19 declaration under Section 564(b)(1) of the Act, 21  U.S.C. section 360bbb-3(b)(1), unless the authorization is  terminated or revoked.    Respiratory Syncytial Virus by PCR POSITIVE (A) NEGATIVE Final    Comment: (NOTE) Fact Sheet for Patients: PinkCheek.be  Fact Sheet for Healthcare Providers: GravelBags.it  This test is not yet approved or cleared by the [place] FDA and  has been authorized for detection and/or diagnosis of SARS-CoV-2 by  FDA under an Emergency Use Authorization (EUA). This EUA will remain  in effect (meaning this test can be used) for the duration of the  COVID-19 declaration under Section 564(b)(1) of the Act, 21 U.S.C.  section 360bbb-3(b)(1), unless the authorization is terminated or  revoked. Performed at [facility], [address] [place], [place] [address]   Surgical pcr screen     Status: None   Collection Time: 12/13/19  3:58 PM   Specimen: Nasal Mucosa; Nasal Swab  Result Value Ref Range Status   MRSA, PCR NEGATIVE NEGATIVE Final   Staphylococcus aureus NEGATIVE NEGATIVE Final    Comment: (NOTE) The Xpert SA Assay (FDA approved for NASAL specimens in patients 42 years of age and older), is one component of a comprehensive surveillance program. It is not intended to diagnose infection nor to guide or monitor treatment. Performed at [facility], [address], [place], [place] [address]     Radiology Reports DG Ankle 2 Views Right  Result Date: 11/30/2019 CLINICAL  DATA:  ORIF right ankle fracture EXAM: DG C-ARM 1-60 MIN; RIGHT ANKLE - 2 VIEW COMPARISON:  11/20/2019 FLUOROSCOPY TIME:  Fluoroscopy Time:  2 minutes Radiation Exposure Index (if provided by the fluoroscopic device): Not available Number of Acquired Spot Images: 5 FINDINGS: Previously seen fixation sideplate  is again noted along the distal fibula. Previously seen medial malleolar fracture is again noted involving the tibiotalar articulation. The subluxation of the talus was reduced and a fixation sideplate was placed along the distal tibia with an additional fixation screw traversing the medial malleolus. New fixation screws noted traversing the fibular sideplate into the tibia. Fracture fragments are in near anatomic alignment. IMPRESSION: Progressive ORIF of distal tibial and fibular fractures. Electronically Signed   By: Inez Catalina M.D.   On: 11/30/2019 15:14   DG Ankle 2 Views Right  Result Date: 11/20/2019 CLINICAL DATA:  74 year old male with comminuted ankle fracture. Intraoperative images. EXAM: DG C-ARM 1-60 MIN; RIGHT ANKLE - 2 VIEW FLUOROSCOPY TIME:  Fluoroscopy Time:  0 minutes 2nd Radiation Exposure Index (if provided by the fluoroscopic device): Number of Acquired Spot Images: 0 COMPARISON:  Right ankle series 11/19/2019. FINDINGS: Five intraoperative fluoroscopic spot views of the right ankle demonstrate reduced comminuted fracture dislocation with near anatomic alignment of the medial malleolus fragment. A fragment of the lateral malleolus superimposed on prior distal fibula ORIF is less well visualized fluoroscopically. The hardware appears stable. IMPRESSION: Improved, near anatomic alignment of right ankle fracture dislocation. Previous distal fibula ORIF. Electronically Signed   By: Genevie Ann M.D.   On: 11/20/2019 19:19   DG Ankle Complete Right  Result Date: 12/14/2019 CLINICAL DATA:  Removal of hardware in attempted external fixation. EXAM: DG C-ARM 1-60 MIN; RIGHT ANKLE - COMPLETE 3+  VIEW FLUOROSCOPY TIME:  Fluoroscopy Time:  1 minutes 19 seconds COMPARISON:  Radiographs December 13, 2019. FINDINGS: Five C-arm fluoroscopic images were obtained intraoperatively and submitted for post operative interpretation. These images demonstrate removal of the previously noted ankle fixation hardware. Image labeled #3 demonstrates attempted external fixation of the tibia with 2 screws. Please see the performing provider's procedural report for further detail. IMPRESSION: Intraoperative fluoroscopic images, as detailed above. Electronically Signed   By: Margaretha Sheffield MD   On: 12/14/2019 11:38   DG Ankle Complete Right  Result Date: 12/13/2019 XR of the right ankle demonstrates loss of fixation of both the medial and lateral malleoli.  Medial plate and screws has pulled out.  Ankle is similar to injury films. Impression: failure of previous ORIF, both medial and lateral malleoli  DG Ankle Complete Right  Result Date: 11/19/2019 CLINICAL DATA:  Pain EXAM: RIGHT ANKLE - COMPLETE 3+ VIEW COMPARISON:  None. FINDINGS: There is an acute displaced trimalleolar fracture with disruption of the ankle mortise. There is significant medial displacement of the talus with respect to the distal tibia. There is a large cyst displaced fracture fragment measuring 4.5 cm involving the medial malleolus. There may be a subtle impaction fracture involving the lateral articular surface of the talus. There is extensive surrounding soft tissue swelling. The patient's prior plate and screw fixation of the distal fibula is bent. There is an acute fracture through the base of the fifth metatarsal. The posterior calcaneus is suboptimally evaluated secondary to overlapping objects external to the patient. IMPRESSION: 1. Fracture dislocation of the ankle as detailed above. 2. Acute fracture through the base of the fifth metatarsal is favored to represent an avulsion fracture, however dedicated foot radiographs are recommended.  Electronically Signed   By: Constance Holster M.D.   On: 11/19/2019 22:20   DG Chest Port 1 View  Result Date: 11/19/2019 CLINICAL DATA:  Fall EXAM: PORTABLE CHEST 1 VIEW COMPARISON:  None. FINDINGS: Lungs are clear. No pneumothorax or pleural effusion. Cardiac size within normal  limits. Pulmonary vascularity is normal. There are probable acute fractures of the left 6, 7, and 8 ribs posterolaterally demonstrating minimal displacement. IMPRESSION: Acute left rib fractures.  No pneumothorax. Electronically Signed   By: Fidela Salisbury MD   On: 11/19/2019 23:20   DG Ankle Right Port  Result Date: 11/30/2019 CLINICAL DATA:  Status post ORIF of right ankle fracture EXAM: PORTABLE RIGHT ANKLE - 2 VIEW COMPARISON:  11/20/2019 FINDINGS: There has been interval removal of 1 of the fixation screws in the fibular side plate with a longer screw placed traversing into the tibia. Fixation sideplate along the medial aspect of the tibia is noted with several screws. A single screws noted traversing the medial malleolus. Fracture fragments are in near anatomic alignment. Splinting material is noted. IMPRESSION: Interval progressive fixation of the distal tibial and fibular fractures. Electronically Signed   By: Inez Catalina M.D.   On: 11/30/2019 15:13   DG Ankle Right Port  Result Date: 11/20/2019 CLINICAL DATA:  74 year old male with comminuted ankle fracture. Status post closed reduction and splinting of bimalleolar right ankle fracture. EXAM: PORTABLE RIGHT ANKLE - 2 VIEW COMPARISON:  Intraoperative images today. Right ankle series 11/19/2019. FINDINGS: Previous distal right fibula ORIF. Splint or cast material now in place. Substantially improved alignment of right ankle fracture dislocation compared to yesterday. On these images there is residual mild displacement and distraction of the medial malleolus fracture. No new osseous injury identified. IMPRESSION: Closed reduction of comminuted right ankle fracture  dislocation. Prior distal fibula ORIF. Electronically Signed   By: Genevie Ann M.D.   On: 11/20/2019 19:21   DG C-Arm 1-60 Min  Result Date: 12/14/2019 CLINICAL DATA:  Removal of hardware in attempted external fixation. EXAM: DG C-ARM 1-60 MIN; RIGHT ANKLE - COMPLETE 3+ VIEW FLUOROSCOPY TIME:  Fluoroscopy Time:  1 minutes 19 seconds COMPARISON:  Radiographs December 13, 2019. FINDINGS: Five C-arm fluoroscopic images were obtained intraoperatively and submitted for post operative interpretation. These images demonstrate removal of the previously noted ankle fixation hardware. Image labeled #3 demonstrates attempted external fixation of the tibia with 2 screws. Please see the performing provider's procedural report for further detail. IMPRESSION: Intraoperative fluoroscopic images, as detailed above. Electronically Signed   By: Margaretha Sheffield MD   On: 12/14/2019 11:38   DG C-Arm 1-60 Min  Result Date: 11/30/2019 CLINICAL DATA:  ORIF right ankle fracture EXAM: DG C-ARM 1-60 MIN; RIGHT ANKLE - 2 VIEW COMPARISON:  11/20/2019 FLUOROSCOPY TIME:  Fluoroscopy Time:  2 minutes Radiation Exposure Index (if provided by the fluoroscopic device): Not available Number of Acquired Spot Images: 5 FINDINGS: Previously seen fixation sideplate is again noted along the distal fibula. Previously seen medial malleolar fracture is again noted involving the tibiotalar articulation. The subluxation of the talus was reduced and a fixation sideplate was placed along the distal tibia with an additional fixation screw traversing the medial malleolus. New fixation screws noted traversing the fibular sideplate into the tibia. Fracture fragments are in near anatomic alignment. IMPRESSION: Progressive ORIF of distal tibial and fibular fractures. Electronically Signed   By: Inez Catalina M.D.   On: 11/30/2019 15:14   DG C-Arm 1-60 Min  Result Date: 11/20/2019 CLINICAL DATA:  74 year old male with comminuted ankle fracture. Intraoperative  images. EXAM: DG C-ARM 1-60 MIN; RIGHT ANKLE - 2 VIEW FLUOROSCOPY TIME:  Fluoroscopy Time:  0 minutes 2nd Radiation Exposure Index (if provided by the fluoroscopic device): Number of Acquired Spot Images: 0 COMPARISON:  Right ankle series 11/19/2019.  FINDINGS: Five intraoperative fluoroscopic spot views of the right ankle demonstrate reduced comminuted fracture dislocation with near anatomic alignment of the medial malleolus fragment. A fragment of the lateral malleolus superimposed on prior distal fibula ORIF is less well visualized fluoroscopically. The hardware appears stable. IMPRESSION: Improved, near anatomic alignment of right ankle fracture dislocation. Previous distal fibula ORIF. Electronically Signed   By: Genevie Ann M.D.   On: 11/20/2019 19:19     CBC Recent Labs  Lab 12/13/19 1341 12/15/19 0938  WBC 10.3 8.0  HGB 11.2* 8.3*  HCT 33.4* 24.7*  PLT 323 302  MCV 96.8 98.8  MCH 32.5 33.2  MCHC 33.5 33.6  RDW 12.2 12.2    Chemistries  Recent Labs  Lab 12/13/19 1341 12/14/19 0511 12/15/19 0715  NA 124* 126* 128*  K 4.0 4.5 4.1  CL 91* 94* 95*  CO2 25 25 25   GLUCOSE 94 93 103*  BUN 12 10 11   CREATININE 0.65 0.59* 0.53*  CALCIUM 8.5* 8.5* 8.2*   ------------------------------------------------------------------------------------------------------------------ No results for input(s): CHOL, HDL, LDLCALC, TRIG, CHOLHDL, LDLDIRECT in the last 72 hours.  No results found for: HGBA1C ------------------------------------------------------------------------------------------------------------------ No results for input(s): TSH, T4TOTAL, T3FREE, THYROIDAB in the last 72 hours.  Invalid input(s): FREET3 ------------------------------------------------------------------------------------------------------------------ Recent Labs    12/16/19 0703  FERRITIN 305  TIBC 185*  IRON 24*    Coagulation profile No results for input(s): INR, PROTIME in the last 168 hours.  No results  for input(s): DDIMER in the last 72 hours.  Cardiac Enzymes No results for input(s): CKMB, TROPONINI, MYOGLOBIN in the last 168 hours.  Invalid input(s): CK ------------------------------------------------------------------------------------------------------------------ No results found for: BNP   Roxan Hockey M.D on 12/16/2019 at 3:03 PM  Go to [email or web address] - for contact info  [facility] - Office  [phone]

## 2019-12-17 DIAGNOSIS — S82891S Other fracture of right lower leg, sequela: Secondary | ICD-10-CM | POA: Diagnosis not present

## 2019-12-17 LAB — BASIC METABOLIC PANEL
Anion gap: 5 (ref 5–15)
BUN: 7 mg/dL — ABNORMAL LOW (ref 8–23)
CO2: 24 mmol/L (ref 22–32)
Calcium: 8.1 mg/dL — ABNORMAL LOW (ref 8.9–10.3)
Chloride: 95 mmol/L — ABNORMAL LOW (ref 98–111)
Creatinine, Ser: 0.48 mg/dL — ABNORMAL LOW (ref 0.61–1.24)
GFR, Estimated: >60 mL/min (ref >60)
Glucose, Bld: 92 mg/dL (ref 70–99)
Potassium: 3.9 mmol/L (ref 3.5–5.1)
Sodium: 124 mmol/L — ABNORMAL LOW (ref 135–145)

## 2019-12-17 LAB — CBC
HCT: 22.8 % — ABNORMAL LOW (ref 39.0–52.0)
Hemoglobin: 7.9 g/dL — ABNORMAL LOW (ref 13.0–17.0)
MCH: 33.1 pg (ref 26.0–34.0)
MCHC: 34.6 g/dL (ref 30.0–36.0)
MCV: 95.4 fL (ref 80.0–100.0)
Platelets: 283 10*3/uL (ref 150–400)
RBC: 2.39 MIL/uL — ABNORMAL LOW (ref 4.22–5.81)
RDW: 11.9 % (ref 11.5–15.5)
WBC: 6.6 10*3/uL (ref 4.0–10.5)
nRBC: 0 % (ref 0.0–0.2)

## 2019-12-17 LAB — OCCULT BLOOD X 1 CARD TO LAB, STOOL: Fecal Occult Bld: NEGATIVE

## 2019-12-17 MED ORDER — SENNOSIDES-DOCUSATE SODIUM 8.6-50 MG PO TABS: 2.0000 | ORAL_TABLET | Freq: Every evening | ORAL | Status: DC | PRN

## 2019-12-17 NOTE — Discharge Summary (Signed)
  Patient ID: ASWAD WANDREY MRN: [id number] DOB/AGE: 1945-09-26 74 y.o.  Admit date: 12/13/2019 Discharge date: 12/17/2019  Admission Diagnoses: Failed right ankle hardware s/p ORIF   Discharge Diagnoses:  Principal Problem:   Closed right ankle fracture, sequela Active Problems:   Alcohol abuse   HTN (hypertension)   Depression with anxiety   Prostate CA (HCC)   BPH (benign prostatic hyperplasia)   Hyponatremia   Alcohol dependence ([place])   Closed right ankle fracture   Tobacco abuse   Past Medical History:  Diagnosis Date  . Anxiety   . Arthritis   . Cancer [facility]) prostate   2013  . Depression   . Heavy drinker of alcohol   . Hyperlipidemia   . Hypertension      Procedures Performed:  Right ankle hardware removal, I&D with failed external fixator placement and splinting of right ankle  Discharged Condition: fair  Hospital Course: Patient presented to clinic for his first postoperative follow 2 weeks after undergoing right ankle ORIF.  The fibular plate was visible and there was an obvious deformity to the right ankle. He was subsequently admitted and taken to the OR for the above stated procedure. He tolerated the procedure well and was returned to the floor.  He remained stable throughout his admission and his pain was well controlled.  He worked with PT and OT.  He was subsequently transferred to [facility] on the hospitalist service for further treatment on his right ankle.  Consults: Hospitalist  Significant Diagnostic Studies: No additional pertinent studies  Treatments: Surgery  Discharge Exam:  Alert this morning.  No acute distress.  He has soiled himself, feces on hands.   RLE splint is intact; No fresh blood on splint Toes WWP Sensation intact distally Wiggles exposed toes.   Disposition:  Transfer to [facility] in stable condition

## 2019-12-17 NOTE — Progress Notes (Signed)
Patient's son Collen Vincent has been updated on patient's transfer to [facility] had several questions related to his transfer and all questions have been answered at this time.

## 2019-12-17 NOTE — Progress Notes (Signed)
   ORTHOPAEDIC PROGRESS NOTE  Scheduled for:  R ankle I&D, HARDWARE REMOVAL and Ex-Fix R ankle  DOS: 12/14/19  SUBJECTIVE: Patient having occasional pain, controlled with available medications.  Multiple BMs.  No issues over night.   OBJECTIVE: PE:  Vitals:   12/16/19 2036 12/17/19 0500  BP: (!) 113/57 (!) 116/50  Pulse: 65 60  Resp: 16 20  Temp: 98.5 F (36.9 C) 98.8 F (37.1 C)  SpO2: 98% 98%   Alert this morning.  No acute distress.  He has soiled himself, feces on hands.   RLE splint is intact; No fresh blood on splint Toes WWP Sensation intact distally Wiggles exposed toes.   ASSESSMENT: Lee Wang is a 74 y.o. male, stable with splinting; plan to transfer patient to [facility] early next week for further care to be provided by Dr. Meridee Score  Requesting transfer to [facility], on medicine service. Dr. Sharol Given to consult.  Appreciated Hospitalist assistance.   PLAN: Weightbearing: NWB RLE Insicional and dressing care: Reinforce dressings as needed Orthopedic device(s): External fixator to remain in place postop VTE prophylaxis: Aspirin 81mg  BID for 6 weeks, to resume postop Pain control: PO medications PRN Follow - up plan: 2 weeks postop Additional ABx postop due to complicated surgical course, and exposed hardware.  Contact information:     Sarim Rothman A. Amedeo [name], MD [place] [address] [place],  [place]  [address] Phone: [phone] Fax: [phone]

## 2019-12-17 NOTE — Progress Notes (Signed)
Patient Demographics:    Lee Wang, is a 74 y.o. male, DOB - Jun 13, 1945, EYC:[id number]  Admit date - 12/13/2019   Admitting Physician Mordecai Rasmussen, MD  Outpatient Primary MD for the patient is Patient, No Pcp Per  LOS - 4   No chief complaint on file.       Subjective:    Lee Wang today has no fevers, no emesis,  No chest pain,   - Had loose stool overnight Rt ankle pain is not worse    Assessment  & Plan :    Principal Problem:   Closed right ankle fracture, sequela Active Problems:   Alcohol abuse   HTN (hypertension)   Depression with anxiety   Prostate CA (HCC)   BPH (benign prostatic hyperplasia)   Hyponatremia   Alcohol dependence (HCC)   Closed right ankle fracture   Tobacco abuse  Brief Summary:- -74 year old male with past medical history relevant for h/o Etoh abuse, BPH, prostate cancer , tobacco abuse, HLD and HTN who had a witnessed mechanical fall after missing a step on 11/19/2019 subsequently imaging studies demonstrated fracture dislocation of the ankle and acute fracture (avulsion) through the base of the fifth metatarsal, he underwent Closed reduction and splinting of bimalleolar right ankle fracture on 11/20/19, on 11/30/2019 patient underwent- Removal of pre-existing plate and screws from the fibula with revision ORIF of the fibula with a locking plate.  ORIF of the vertical shear fracture of the medial malleolus -On 12/14/2019 patient was found to have failure of hardware with essentially conversion of his close fractures and open fracture, on 12/14/19 patient underwent Removal of hardware from right ankle, irrigation and debridement,  unsuccessful attempt at placement of external fixation and splint application --- Transfer to [place] --pt needs to stay on Hospitalist Service at [facility] with Dr. Sharol Given consulting with Plans for Rt Ankle Surgery (Fusion) on  Wednesday 12/20/19   A/p 1)Symptomatic Hyponatremia--patient with history of alcohol abuse as well as history of excessive free water intake -Sodium is trended back down again, currently  124 -Lexapro may be contributing to patient's hyponatremia--okay to stop Lexapro -Recheck BMP on 12/18/2019 -Will restart gentle hydration with normal saline IV  2)RT Ankle Fracture with failed hardware---On 12/14/2019 patient was found to have failure of hardware with essentially conversion of his closed fractures and open fracture, on 12/14/19 patient underwent Removal of hardware from right ankle, irrigation and debridement,  unsuccessful attempt at placement of external fixation and splint application -Will continue to provide antibiotics/Rocephin through the weekend as per cellulitis order set given exposed hardware and complicated clinical/surgical course -further Rx per orthopedic team -Plan is for patient to be transferred to [facility] for ankle fusion by Dr. Sharol Given --tentatively planned for Wednesday, 12/20/2019  3)HTN--stable, c/n propranolol ER 60 mg in a.m., IV labetalol  as needed elevated BP  4)Depression/Anxiety--May use Xanax as needed, okay to stop Lexapro due to persistent hyponatremia  5)Alcohol and Tobacco Abuse--continue patch as ordered, multivitamin -Low risk for DTs since no recent EtOH use  6) acute on chronic iron deficiency anemia--H&H drop is most likely due to postop blood loss compounded by hemodilution from IV fluids for hyponatremia -Recent B12 and folate were not low,  -Serum  iron is low at 24, iron saturation is low at 13, TIBC is low at 115 -Ferritin is not low most likely because patient has an acute infection/inflammation -Check stool for occult blood -Transfuse as clinically indicated  Disposition/Need for in-Hospital Stay- patient unable to be discharged at this time due to --unstable right ankle/failed hardware with significant ambulatory dysfunction  requiring further orthopedic intervention, as well as HypoNatremia requiring IV fluid --Plan is for patient to be transferred to [facility] for ankle fusion by Dr. Sharol Given --tentatively planned for Wednesday, 12/20/2019  Status is: Inpatient  Remains inpatient appropriate because:unstable right ankle/failed hardware with significant ambulatory dysfunction requiring further orthopedic intervention, as well as HypoNatremia requiring IV fluid   Disposition: The patient is from: SNF              Anticipated d/c is to: SNF              Anticipated d/c date is: > 3 days              Patient currently is not medically stable to d/c. Barriers: Not Clinically Stable- unstable right ankle/failed hardware with significant ambulatory dysfunction requiring further orthopedic intervention, as well as HypoNatremia requiring IV fluid ---Plan is for patient to be transferred to [facility] for ankle fusion by Dr. Sharol Given --tentatively planned for Wednesday, 12/20/2019  Code Status : Full code  Family Communication:    (patient is alert, awake and coherent)  -Previously discussed with daughter-- Leslie---[phone] Consults  :  ortho  DVT Prophylaxis  :    SCDs /Aspirin  Lab Results  Component Value Date   PLT 283 12/17/2019   Inpatient Medications  Scheduled Meds: . aspirin EC  81 mg Oral BID  . atorvastatin  40 mg Oral Daily  . folic acid  1 mg Oral Daily  . multivitamin with minerals  1 tablet Oral Daily  . propranolol ER  60 mg Oral Daily  . senna-docusate  2 tablet Oral QHS  . thiamine  100 mg Oral Daily   Or  . thiamine  100 mg Intravenous Daily   Continuous Infusions: . sodium chloride 20 mL/hr at 12/16/19 1657  . cefTRIAXone (ROCEPHIN)  IV 1 g (12/16/19 0855)   PRN Meds:.  Anti-infectives (From admission, onward)   Start     Dose/Rate Route Frequency Ordered Stop   12/15/19 0900  cefTRIAXone (ROCEPHIN) 1 g in sodium chloride 0.9 % 100 mL IVPB        1 g 200 mL/hr  over 30 Minutes Intravenous Every 24 hours 12/15/19 0803 12/18/19 0859   12/14/19 1400  ceFAZolin (ANCEF) IVPB 2g/100 mL premix        2 g 200 mL/hr over 30 Minutes Intravenous Every 8 hours 12/14/19 1158 12/15/19 2049   12/14/19 0600  ceFAZolin (ANCEF) IVPB 2g/100 mL premix        2 g 200 mL/hr over 30 Minutes Intravenous On call to O.R. 12/13/19 1316 12/14/19 0807   12/13/19 1415  ceFAZolin (ANCEF) IVPB 2g/100 mL premix        2 g 200 mL/hr over 30 Minutes Intravenous Every 8 hours 12/13/19 1315 12/14/19 0607        Objective:   Vitals:   12/16/19 0400 12/16/19 1416 12/16/19 2036 12/17/19 0500  BP: 133/65 (!) 96/49 (!) 113/57 (!) 116/50  Pulse: 60 64 65 60  Resp: 14 17 16 20   Temp: 98.7 F (37.1 C) 98.2 F (36.8 C) 98.5 F (36.9  C) 98.8 F (37.1 C)  TempSrc: Oral Oral    SpO2: 100%  98% 98%  Weight:      Height:        Wt Readings from Last 3 Encounters:  12/13/19 56.2 kg  11/30/19 63.1 kg  11/29/19 63.1 kg    Intake/Output Summary (Last 24 hours) at 12/17/2019 0952 Last data filed at 12/17/2019 0400 Gross per 24 hour  Intake 480 ml  Output 1400 ml  Net -920 ml   Physical Exam Gen:- Awake Alert,  In no apparent distress  HEENT:- [place].AT, No sclera icterus Neck-Supple Neck,No JVD,  Lungs-no wheezing, fair symmetrical air movement CV- S1, S2 normal, regular  Abd-  +ve B.Sounds, Abd Soft, No tenderness,    Psych-affect is appropriate, oriented x3 Neuro-no new focal deficits, no tremors MSK-right leg dressing wraps intact, capillary refill is good   Data Review:   Micro Results Recent Results (from the past 240 hour(s))  Respiratory Panel by RT PCR (Flu A&B, Covid) - Nasopharyngeal Swab     Status: None   Collection Time: 12/13/19  1:15 PM   Specimen: Nasopharyngeal Swab  Result Value Ref Range Status   SARS Coronavirus 2 by RT PCR NEGATIVE NEGATIVE Final    Comment: (NOTE) SARS-CoV-2 target nucleic acids are NOT DETECTED.  The SARS-CoV-2 RNA is generally  detectable in upper respiratoy specimens during the acute phase of infection. The lowest concentration of SARS-CoV-2 viral copies this assay can detect is 131 copies/mL. A negative result does not preclude SARS-Cov-2 infection and should not be used as the sole basis for treatment or other patient management decisions. A negative result may occur with  improper specimen collection/handling, submission of specimen other than nasopharyngeal swab, presence of viral mutation(s) within the areas targeted by this assay, and inadequate number of viral copies (<131 copies/mL). A negative result must be combined with clinical observations, patient history, and epidemiological information. The expected result is Negative.  Fact Sheet for Patients:  PinkCheek.be  Fact Sheet for Healthcare Providers:  GravelBags.it  This test is no t yet approved or cleared by the [place] FDA and  has been authorized for detection and/or diagnosis of SARS-CoV-2 by FDA under an Emergency Use Authorization (EUA). This EUA will remain  in effect (meaning this test can be used) for the duration of the COVID-19 declaration under Section 564(b)(1) of the Act, 21 U.S.C. section 360bbb-3(b)(1), unless the authorization is terminated or revoked sooner.     Influenza A by PCR NEGATIVE NEGATIVE Final   Influenza B by PCR NEGATIVE NEGATIVE Final    Comment: (NOTE) The Xpert Xpress SARS-CoV-2/FLU/RSV assay is intended as an aid in  the diagnosis of influenza from Nasopharyngeal swab specimens and  should not be used as a sole basis for treatment. Nasal washings and  aspirates are unacceptable for Xpert Xpress SARS-CoV-2/FLU/RSV  testing.  Fact Sheet for Patients: PinkCheek.be  Fact Sheet for Healthcare Providers: GravelBags.it  This test is not yet approved or cleared by the [place] FDA and   has been authorized for detection and/or diagnosis of SARS-CoV-2 by  FDA under an Emergency Use Authorization (EUA). This EUA will remain  in effect (meaning this test can be used) for the duration of the  Covid-19 declaration under Section 564(b)(1) of the Act, 21  U.S.C. section 360bbb-3(b)(1), unless the authorization is  terminated or revoked. Performed at [facility], [address], [place], [place] [address]   Resp Panel by RT PCR (RSV, Flu A&B,  Covid) -     Status: Abnormal   Collection Time: 12/13/19  1:15 PM  Result Value Ref Range Status   SARS Coronavirus 2 by RT PCR NEGATIVE NEGATIVE Final    Comment: (NOTE) SARS-CoV-2 target nucleic acids are NOT DETECTED.  The SARS-CoV-2 RNA is generally detectable in upper respiratoy specimens during the acute phase of infection. The lowest concentration of SARS-CoV-2 viral copies this assay can detect is 131 copies/mL. A negative result does not preclude SARS-Cov-2 infection and should not be used as the sole basis for treatment or other patient management decisions. A negative result may occur with  improper specimen collection/handling, submission of specimen other than nasopharyngeal swab, presence of viral mutation(s) within the areas targeted by this assay, and inadequate number of viral copies (<131 copies/mL). A negative result must be combined with clinical observations, patient history, and epidemiological information. The expected result is Negative.  Fact Sheet for Patients:  PinkCheek.be  Fact Sheet for Healthcare Providers:  GravelBags.it  This test is no t yet approved or cleared by the [place] FDA and  has been authorized for detection and/or diagnosis of SARS-CoV-2 by FDA under an Emergency Use Authorization (EUA). This EUA will remain  in effect (meaning this test can be used) for the duration of the COVID-19 declaration under Section 564(b)(1) of  the Act, 21 U.S.C. section 360bbb-3(b)(1), unless the authorization is terminated or revoked sooner.     Influenza A by PCR NEGATIVE NEGATIVE Final   Influenza B by PCR NEGATIVE NEGATIVE Final    Comment: (NOTE) The Xpert Xpress SARS-CoV-2/FLU/RSV assay is intended as an aid in  the diagnosis of influenza from Nasopharyngeal swab specimens and  should not be used as a sole basis for treatment. Nasal washings and  aspirates are unacceptable for Xpert Xpress SARS-CoV-2/FLU/RSV  testing.  Fact Sheet for Patients: PinkCheek.be  Fact Sheet for Healthcare Providers: GravelBags.it  This test is not yet approved or cleared by the [place] FDA and  has been authorized for detection and/or diagnosis of SARS-CoV-2 by  FDA under an Emergency Use Authorization (EUA). This EUA will remain  in effect (meaning this test can be used) for the duration of the  Covid-19 declaration under Section 564(b)(1) of the Act, 21  U.S.C. section 360bbb-3(b)(1), unless the authorization is  terminated or revoked.    Respiratory Syncytial Virus by PCR POSITIVE (A) NEGATIVE Final    Comment: (NOTE) Fact Sheet for Patients: PinkCheek.be  Fact Sheet for Healthcare Providers: GravelBags.it  This test is not yet approved or cleared by the [place] FDA and  has been authorized for detection and/or diagnosis of SARS-CoV-2 by  FDA under an Emergency Use Authorization (EUA). This EUA will remain  in effect (meaning this test can be used) for the duration of the  COVID-19 declaration under Section 564(b)(1) of the Act, 21 U.S.C.  section 360bbb-3(b)(1), unless the authorization is terminated or  revoked. Performed at [facility], [address] [place], [place] [address]   Surgical pcr screen     Status: None   Collection Time: 12/13/19  3:58 PM   Specimen: Nasal Mucosa; Nasal Swab   Result Value Ref Range Status   MRSA, PCR NEGATIVE NEGATIVE Final   Staphylococcus aureus NEGATIVE NEGATIVE Final    Comment: (NOTE) The Xpert SA Assay (FDA approved for NASAL specimens in patients 85 years of age and older), is one component of a comprehensive surveillance program. It is not intended to diagnose infection nor to  guide or monitor treatment. Performed at [facility], [address], [place], [place] [address]     Radiology Reports DG Ankle 2 Views Right  Result Date: 11/30/2019 CLINICAL DATA:  ORIF right ankle fracture EXAM: DG C-ARM 1-60 MIN; RIGHT ANKLE - 2 VIEW COMPARISON:  11/20/2019 FLUOROSCOPY TIME:  Fluoroscopy Time:  2 minutes Radiation Exposure Index (if provided by the fluoroscopic device): Not available Number of Acquired Spot Images: 5 FINDINGS: Previously seen fixation sideplate is again noted along the distal fibula. Previously seen medial malleolar fracture is again noted involving the tibiotalar articulation. The subluxation of the talus was reduced and a fixation sideplate was placed along the distal tibia with an additional fixation screw traversing the medial malleolus. New fixation screws noted traversing the fibular sideplate into the tibia. Fracture fragments are in near anatomic alignment. IMPRESSION: Progressive ORIF of distal tibial and fibular fractures. Electronically Signed   By: Inez Catalina M.D.   On: 11/30/2019 15:14   DG Ankle 2 Views Right  Result Date: 11/20/2019 CLINICAL DATA:  74 year old male with comminuted ankle fracture. Intraoperative images. EXAM: DG C-ARM 1-60 MIN; RIGHT ANKLE - 2 VIEW FLUOROSCOPY TIME:  Fluoroscopy Time:  0 minutes 2nd Radiation Exposure Index (if provided by the fluoroscopic device): Number of Acquired Spot Images: 0 COMPARISON:  Right ankle series 11/19/2019. FINDINGS: Five intraoperative fluoroscopic spot views of the right ankle demonstrate reduced comminuted fracture dislocation with near anatomic alignment of  the medial malleolus fragment. A fragment of the lateral malleolus superimposed on prior distal fibula ORIF is less well visualized fluoroscopically. The hardware appears stable. IMPRESSION: Improved, near anatomic alignment of right ankle fracture dislocation. Previous distal fibula ORIF. Electronically Signed   By: Genevie Ann M.D.   On: 11/20/2019 19:19   DG Ankle Complete Right  Result Date: 12/14/2019 CLINICAL DATA:  Removal of hardware in attempted external fixation. EXAM: DG C-ARM 1-60 MIN; RIGHT ANKLE - COMPLETE 3+ VIEW FLUOROSCOPY TIME:  Fluoroscopy Time:  1 minutes 19 seconds COMPARISON:  Radiographs December 13, 2019. FINDINGS: Five C-arm fluoroscopic images were obtained intraoperatively and submitted for post operative interpretation. These images demonstrate removal of the previously noted ankle fixation hardware. Image labeled #3 demonstrates attempted external fixation of the tibia with 2 screws. Please see the performing provider's procedural report for further detail. IMPRESSION: Intraoperative fluoroscopic images, as detailed above. Electronically Signed   By: Margaretha Sheffield MD   On: 12/14/2019 11:38   DG Ankle Complete Right  Result Date: 12/13/2019 XR of the right ankle demonstrates loss of fixation of both the medial and lateral malleoli.  Medial plate and screws has pulled out.  Ankle is similar to injury films. Impression: failure of previous ORIF, both medial and lateral malleoli  DG Ankle Complete Right  Result Date: 11/19/2019 CLINICAL DATA:  Pain EXAM: RIGHT ANKLE - COMPLETE 3+ VIEW COMPARISON:  None. FINDINGS: There is an acute displaced trimalleolar fracture with disruption of the ankle mortise. There is significant medial displacement of the talus with respect to the distal tibia. There is a large cyst displaced fracture fragment measuring 4.5 cm involving the medial malleolus. There may be a subtle impaction fracture involving the lateral articular surface of the talus.  There is extensive surrounding soft tissue swelling. The patient's prior plate and screw fixation of the distal fibula is bent. There is an acute fracture through the base of the fifth metatarsal. The posterior calcaneus is suboptimally evaluated secondary to overlapping objects external to the patient. IMPRESSION: 1. Fracture  dislocation of the ankle as detailed above. 2. Acute fracture through the base of the fifth metatarsal is favored to represent an avulsion fracture, however dedicated foot radiographs are recommended. Electronically Signed   By: Constance Holster M.D.   On: 11/19/2019 22:20   DG Chest Port 1 View  Result Date: 11/19/2019 CLINICAL DATA:  Fall EXAM: PORTABLE CHEST 1 VIEW COMPARISON:  None. FINDINGS: Lungs are clear. No pneumothorax or pleural effusion. Cardiac size within normal limits. Pulmonary vascularity is normal. There are probable acute fractures of the left 6, 7, and 8 ribs posterolaterally demonstrating minimal displacement. IMPRESSION: Acute left rib fractures.  No pneumothorax. Electronically Signed   By: Fidela Salisbury MD   On: 11/19/2019 23:20   DG Ankle Right Port  Result Date: 11/30/2019 CLINICAL DATA:  Status post ORIF of right ankle fracture EXAM: PORTABLE RIGHT ANKLE - 2 VIEW COMPARISON:  11/20/2019 FINDINGS: There has been interval removal of 1 of the fixation screws in the fibular side plate with a longer screw placed traversing into the tibia. Fixation sideplate along the medial aspect of the tibia is noted with several screws. A single screws noted traversing the medial malleolus. Fracture fragments are in near anatomic alignment. Splinting material is noted. IMPRESSION: Interval progressive fixation of the distal tibial and fibular fractures. Electronically Signed   By: Inez Catalina M.D.   On: 11/30/2019 15:13   DG Ankle Right Port  Result Date: 11/20/2019 CLINICAL DATA:  74 year old male with comminuted ankle fracture. Status post closed reduction and  splinting of bimalleolar right ankle fracture. EXAM: PORTABLE RIGHT ANKLE - 2 VIEW COMPARISON:  Intraoperative images today. Right ankle series 11/19/2019. FINDINGS: Previous distal right fibula ORIF. Splint or cast material now in place. Substantially improved alignment of right ankle fracture dislocation compared to yesterday. On these images there is residual mild displacement and distraction of the medial malleolus fracture. No new osseous injury identified. IMPRESSION: Closed reduction of comminuted right ankle fracture dislocation. Prior distal fibula ORIF. Electronically Signed   By: Genevie Ann M.D.   On: 11/20/2019 19:21   DG C-Arm 1-60 Min  Result Date: 12/14/2019 CLINICAL DATA:  Removal of hardware in attempted external fixation. EXAM: DG C-ARM 1-60 MIN; RIGHT ANKLE - COMPLETE 3+ VIEW FLUOROSCOPY TIME:  Fluoroscopy Time:  1 minutes 19 seconds COMPARISON:  Radiographs December 13, 2019. FINDINGS: Five C-arm fluoroscopic images were obtained intraoperatively and submitted for post operative interpretation. These images demonstrate removal of the previously noted ankle fixation hardware. Image labeled #3 demonstrates attempted external fixation of the tibia with 2 screws. Please see the performing provider's procedural report for further detail. IMPRESSION: Intraoperative fluoroscopic images, as detailed above. Electronically Signed   By: Margaretha Sheffield MD   On: 12/14/2019 11:38   DG C-Arm 1-60 Min  Result Date: 11/30/2019 CLINICAL DATA:  ORIF right ankle fracture EXAM: DG C-ARM 1-60 MIN; RIGHT ANKLE - 2 VIEW COMPARISON:  11/20/2019 FLUOROSCOPY TIME:  Fluoroscopy Time:  2 minutes Radiation Exposure Index (if provided by the fluoroscopic device): Not available Number of Acquired Spot Images: 5 FINDINGS: Previously seen fixation sideplate is again noted along the distal fibula. Previously seen medial malleolar fracture is again noted involving the tibiotalar articulation. The subluxation of the talus  was reduced and a fixation sideplate was placed along the distal tibia with an additional fixation screw traversing the medial malleolus. New fixation screws noted traversing the fibular sideplate into the tibia. Fracture fragments are in near anatomic alignment. IMPRESSION: Progressive ORIF of  distal tibial and fibular fractures. Electronically Signed   By: Inez Catalina M.D.   On: 11/30/2019 15:14   DG C-Arm 1-60 Min  Result Date: 11/20/2019 CLINICAL DATA:  74 year old male with comminuted ankle fracture. Intraoperative images. EXAM: DG C-ARM 1-60 MIN; RIGHT ANKLE - 2 VIEW FLUOROSCOPY TIME:  Fluoroscopy Time:  0 minutes 2nd Radiation Exposure Index (if provided by the fluoroscopic device): Number of Acquired Spot Images: 0 COMPARISON:  Right ankle series 11/19/2019. FINDINGS: Five intraoperative fluoroscopic spot views of the right ankle demonstrate reduced comminuted fracture dislocation with near anatomic alignment of the medial malleolus fragment. A fragment of the lateral malleolus superimposed on prior distal fibula ORIF is less well visualized fluoroscopically. The hardware appears stable. IMPRESSION: Improved, near anatomic alignment of right ankle fracture dislocation. Previous distal fibula ORIF. Electronically Signed   By: Genevie Ann M.D.   On: 11/20/2019 19:19     CBC Recent Labs  Lab 12/13/19 1341 12/15/19 0938 12/17/19 0555  WBC 10.3 8.0 6.6  HGB 11.2* 8.3* 7.9*  HCT 33.4* 24.7* 22.8*  PLT 323 302 283  MCV 96.8 98.8 95.4  MCH 32.5 33.2 33.1  MCHC 33.5 33.6 34.6  RDW 12.2 12.2 11.9    Chemistries  Recent Labs  Lab 12/13/19 1341 12/14/19 0511 12/15/19 0715 12/17/19 0555  NA 124* 126* 128* 124*  K 4.0 4.5 4.1 3.9  CL 91* 94* 95* 95*  CO2 25 25 25 24   GLUCOSE 94 93 103* 92  BUN 12 10 11  7*  CREATININE 0.65 0.59* 0.53* 0.48*  CALCIUM 8.5* 8.5* 8.2* 8.1*    ------------------------------------------------------------------------------------------------------------------ No results for input(s): CHOL, HDL, LDLCALC, TRIG, CHOLHDL, LDLDIRECT in the last 72 hours.  No results found for: HGBA1C ------------------------------------------------------------------------------------------------------------------ No results for input(s): TSH, T4TOTAL, T3FREE, THYROIDAB in the last 72 hours.  Invalid input(s): FREET3 ------------------------------------------------------------------------------------------------------------------ Recent Labs    12/16/19 0703  FERRITIN 305  TIBC 185*  IRON 24*    Coagulation profile No results for input(s): INR, PROTIME in the last 168 hours.  No results for input(s): DDIMER in the last 72 hours.  Cardiac Enzymes No results for input(s): CKMB, TROPONINI, MYOGLOBIN in the last 168 hours.  Invalid input(s): CK ------------------------------------------------------------------------------------------------------------------ No results found for: BNP   Roxan Hockey M.D on 12/17/2019 at 9:52 AM  Go to [email or web address] - for contact info  [facility] - Office  [phone]

## 2019-12-17 NOTE — Progress Notes (Signed)
Patient to be transferred to [facility] Report called and given to [facility]. All questions were answered and no further questions at this time. Patient to be transported via [place]. Attempts have been made to contact patient's son and daughter Maximillion Gill and Jedrek Dinovo but they have been unsuccessful.

## 2019-12-18 ENCOUNTER — Encounter (HOSPITAL_COMMUNITY): Payer: Self-pay | Admitting: Orthopedic Surgery

## 2019-12-18 DIAGNOSIS — T8131XA Disruption of external operation (surgical) wound, not elsewhere classified, initial encounter: Secondary | ICD-10-CM

## 2019-12-18 DIAGNOSIS — Z720 Tobacco use: Secondary | ICD-10-CM | POA: Diagnosis not present

## 2019-12-18 DIAGNOSIS — I10 Essential (primary) hypertension: Secondary | ICD-10-CM | POA: Diagnosis not present

## 2019-12-18 DIAGNOSIS — S82891S Other fracture of right lower leg, sequela: Secondary | ICD-10-CM | POA: Diagnosis not present

## 2019-12-18 LAB — BASIC METABOLIC PANEL
Anion gap: 7 (ref 5–15)
Anion gap: 7 (ref 5–15)
Anion gap: 7 (ref 5–15)
BUN: 6 mg/dL — ABNORMAL LOW (ref 8–23)
BUN: <5 mg/dL — ABNORMAL LOW (ref 8–23)
BUN: 5 mg/dL — ABNORMAL LOW (ref 8–23)
CO2: 23 mmol/L (ref 22–32)
CO2: 24 mmol/L (ref 22–32)
CO2: 22 mmol/L (ref 22–32)
Calcium: 8.3 mg/dL — ABNORMAL LOW (ref 8.9–10.3)
Calcium: 8.5 mg/dL — ABNORMAL LOW (ref 8.9–10.3)
Calcium: 8.4 mg/dL — ABNORMAL LOW (ref 8.9–10.3)
Chloride: 97 mmol/L — ABNORMAL LOW (ref 98–111)
Chloride: 95 mmol/L — ABNORMAL LOW (ref 98–111)
Chloride: 95 mmol/L — ABNORMAL LOW (ref 98–111)
Creatinine, Ser: 0.65 mg/dL (ref 0.61–1.24)
Creatinine, Ser: 0.48 mg/dL — ABNORMAL LOW (ref 0.61–1.24)
Creatinine, Ser: 0.59 mg/dL — ABNORMAL LOW (ref 0.61–1.24)
GFR, Estimated: >60 mL/min (ref >60)
GFR, Estimated: >60 mL/min (ref >60)
GFR, Estimated: >60 mL/min (ref >60)
Glucose, Bld: 86 mg/dL (ref 70–99)
Glucose, Bld: 99 mg/dL (ref 70–99)
Glucose, Bld: 121 mg/dL — ABNORMAL HIGH (ref 70–99)
Potassium: 4.5 mmol/L (ref 3.5–5.1)
Potassium: 4 mmol/L (ref 3.5–5.1)
Potassium: 4 mmol/L (ref 3.5–5.1)
Sodium: 127 mmol/L — ABNORMAL LOW (ref 135–145)
Sodium: 126 mmol/L — ABNORMAL LOW (ref 135–145)
Sodium: 124 mmol/L — ABNORMAL LOW (ref 135–145)

## 2019-12-18 LAB — CBC
HCT: 22.2 % — ABNORMAL LOW (ref 39.0–52.0)
Hemoglobin: 7.5 g/dL — ABNORMAL LOW (ref 13.0–17.0)
MCH: 31.8 pg (ref 26.0–34.0)
MCHC: 33.8 g/dL (ref 30.0–36.0)
MCV: 94.1 fL (ref 80.0–100.0)
Platelets: 264 10*3/uL (ref 150–400)
RBC: 2.36 MIL/uL — ABNORMAL LOW (ref 4.22–5.81)
RDW: 12 % (ref 11.5–15.5)
WBC: 6 10*3/uL (ref 4.0–10.5)
nRBC: 0 % (ref 0.0–0.2)

## 2019-12-18 MED ORDER — ENSURE ENLIVE PO LIQD
237.0000 mL | Freq: Two times a day (BID) | ORAL | Status: DC
  Administered 2019-12-18 – 2019-12-26 (×9): 237 mL via ORAL

## 2019-12-18 MED ORDER — NICOTINE 14 MG/24HR TD PT24
14.0000 mg | MEDICATED_PATCH | Freq: Every day | TRANSDERMAL | Status: DC
  Administered 2019-12-18: 14 mg via TRANSDERMAL
  Filled 2019-12-18: qty 1

## 2019-12-18 MED ORDER — SODIUM CHLORIDE 1 G PO TABS
1.0000 g | ORAL_TABLET | Freq: Two times a day (BID) | ORAL | Status: DC
  Administered 2019-12-18 – 2019-12-19 (×2): 1 g via ORAL
  Filled 2019-12-18 (×3): qty 1

## 2019-12-18 MED ORDER — FERROUS SULFATE 325 (65 FE) MG PO TABS
325.0000 mg | ORAL_TABLET | Freq: Two times a day (BID) | ORAL | Status: DC
  Administered 2019-12-18 – 2019-12-26 (×15): 325 mg via ORAL
  Filled 2019-12-18 (×14): qty 1

## 2019-12-18 MED ORDER — VITAMIN B-12 1000 MCG PO TABS
1000.0000 ug | ORAL_TABLET | Freq: Every day | ORAL | Status: DC
  Administered 2019-12-18 – 2019-12-26 (×8): 1000 ug via ORAL
  Filled 2019-12-18 (×8): qty 1

## 2019-12-18 NOTE — Progress Notes (Signed)
OT Cancellation Note  Patient Details Name: Lee Wang MRN: [id number] DOB: 05-03-1945   Cancelled Treatment:    Reason Eval/Treat Not Completed: Medical issues which prohibited therapy. On OT entry, pt's bandaging/casting cut open. Notified RN and will hold off on therapy until bandaging replaced.   Also noted PT cancel note this afternoon - Dr. Sharol Given with plans for ankle fusion 11/10 and requests holding OOB activities until after surgery. Will follow-up on this when time allows.   [name] 12/18/2019, 1:24 PM

## 2019-12-18 NOTE — Progress Notes (Signed)
[facility] Progress Note  Patient: Lee Wang    DJS:[id number]  DOA: 12/13/2019     Date of Service: the patient was seen and examined on 12/18/2019  Brief hospital course: 74 year old male with past medical history relevant for h/o Etoh abuse, BPH, prostate cancer , tobacco abuse, HLD and HTN who had a witnessed mechanical fall after missing a step on 11/19/2019 subsequently imaging studies demonstrated fracture dislocation of the ankle and acute fracture (avulsion) through the base of the fifth metatarsal, he underwent Closed reduction and splinting of bimalleolar right ankle fracture on 11/20/19, on 11/30/2019 patient underwent-Removal of pre-existing plate and screws from the fibula with revision ORIF of the fibula with a locking plate. ORIF of the vertical shear fracture of the medial malleolus -On 12/14/2019 patient was found to have failure of hardware with essentially conversion of his close fractures and open fracture, on 12/14/19 patient underwent Removal of hardware from right ankle,irrigation and debridement, unsuccessful attempt at placement of external fixation and splint application Transferred to [place] under hospitalist service for further care.  Currently plan is for hyponatremia and ensure patient is stable for surgery..  Assessment and Plan: 1.  Hypoosmolar hyponatremia.  Symptomatic. Acute metabolic encephalopathy. From excessive alcohol intake as well as poor p.o. intake otherwise. Currently somewhat confused.  Unable to tell me the name of the president. No focal deficit on my examination. No headache.  No dizziness. No chest pain. Sodium 127 in the morning.  126 on repeat. Continue with IV fluid and monitor. Add Ensure.  Add salt tablets. Hold Lexapro.  2.  Right ankle fracture with failed hardware. Presented with an open wound with failed hardware on 12/14/2019. Underwent removal of the hardware, irrigation and debridement as well as  unsuccessful attempt at placement of external fixation on 12/14/2019. Patient was on IV antibiotics. Patient is currently transferred to [facility] for ankle fusion by Dr. Sharol Given tentatively scheduled for Wednesday, 12/20/2019.  3.  Acute on chronic iron deficiency anemia. Postop acute blood loss anemia. Likely component of hemodilution as well. H&H relatively stable for now. Iron level is low. Transfuse for hemoglobin less than 7. Continue iron supplementation.  4.  Alcohol abuse Currently low risk for DVT. Continue close monitoring. Has been at [facility] since recent hospitalization.  5.  Tobacco abuse. Holding off on nicotine patch due to confusion.  Monitor.  6.  Depression and anxiety. No suicidal ideation. No significant anxiety as well. Lexapro on hold.  7.  Essential hypertension. Patient is on propranolol which is currently on hold.  8.  Incidentally RSV positive. Continue droplet precaution.  No respiratory symptoms for now.  Diet: Regular diet. DVT Prophylaxis: Subcutaneous Lovenox      Advance goals of care discussion: Full code  Family Communication: no family was present at bedside, at the time of interview.   Disposition:  Status is: Inpatient  Remains inpatient appropriate because:Persistent severe electrolyte disturbances and Ongoing diagnostic testing needed not appropriate for outpatient work up   Dispo: The patient is from: Home              Anticipated d/c is to: SNF              Anticipated d/c date is: > 3 days              Patient currently is not medically stable to d/c.        Subjective: No nausea no vomiting.  No fever no chills.  No chest pain.  No abdominal pain.  Physical Exam:  General: Appear in mild distress, no Rash; Oral Mucosa Clear, moist. no Abnormal Neck Mass Or lumps, Conjunctiva normal  Cardiovascular: S1 and S2 Present, no Murmur, Respiratory: good respiratory effort, Bilateral Air entry present and CTA, no  Crackles, no wheezes Abdomen: Bowel Sound present, Soft and no tenderness Extremities: no Pedal edema Neurology: alert and oriented to time, place, and person affect appropriate. no new focal deficit Gait not checked due to patient safety concerns  Vitals:   12/18/19 0300 12/18/19 0800 12/18/19 0849 12/18/19 1554  BP: 111/63  120/84 117/73  Pulse: 61  60 (!) 58  Resp: 17 18 17 17   Temp: 98.6 F (37 C)  98.6 F (37 C) 99.4 F (37.4 C)  TempSrc: Oral  Oral Oral  SpO2: 98% 99% 99% 99%  Weight:      Height:        Intake/Output Summary (Last 24 hours) at 12/18/2019 1802 Last data filed at 12/18/2019 1601 Gross per 24 hour  Intake --  Output 600 ml  Net -600 ml   Filed Weights   12/13/19 1302 12/17/19 1248  Weight: 56.2 kg 64.3 kg    Data Reviewed: I have personally reviewed and interpreted daily labs, tele strips, imagings as discussed above. I reviewed all nursing notes, pharmacy notes, vitals, pertinent old records I have discussed plan of care as described above with RN and patient/family.  CBC: Recent Labs  Lab 12/13/19 1341 12/15/19 0938 12/17/19 0555 12/18/19 0230  WBC 10.3 8.0 6.6 6.0  HGB 11.2* 8.3* 7.9* 7.5*  HCT 33.4* 24.7* 22.8* 22.2*  MCV 96.8 98.8 95.4 94.1  PLT 323 302 283 500   Basic Metabolic Panel: Recent Labs  Lab 12/14/19 0511 12/15/19 0715 12/17/19 0555 12/18/19 0230 12/18/19 1301  NA 126* 128* 124* 127* 126*  K 4.5 4.1 3.9 4.5 4.0  CL 94* 95* 95* 97* 95*  CO2 25 25 24 23 24   GLUCOSE 93 103* 92 86 99  BUN 10 11 7* 6* <5*  CREATININE 0.59* 0.53* 0.48* 0.65 0.48*  CALCIUM 8.5* 8.2* 8.1* 8.3* 8.5*    Studies: No results found.  Scheduled Meds: . aspirin EC  81 mg Oral BID  . atorvastatin  40 mg Oral Daily  . feeding supplement  237 mL Oral BID BM  . ferrous sulfate  325 mg Oral BID WC  . folic acid  1 mg Oral Daily  . multivitamin with minerals  1 tablet Oral Daily  . nicotine  14 mg Transdermal Daily  . propranolol ER  60 mg  Oral Daily  . thiamine  100 mg Oral Daily   Or  . thiamine  100 mg Intravenous Daily  . vitamin B-12  1,000 mcg Oral Daily   Continuous Infusions: . sodium chloride 50 mL/hr at 12/17/19 1008   PRN Meds: labetalol, senna-docusate  Time spent: 35 minutes  Author: Berle Mull, MD [facility] 12/18/2019 6:02 PM  To reach On-call, see care teams to locate the attending and reach out via [email or web address]. Between 7PM-7AM, please contact night-coverage If you still have difficulty reaching the attending provider, please page the [facility] (Director on Call) for [facility] on amion for assistance.

## 2019-12-18 NOTE — Consult Note (Addendum)
ORTHOPAEDIC CONSULTATION  REQUESTING PHYSICIAN: Lavina Hamman, MD  Chief Complaint: Dehiscence.internal fixation right ankle Fracture.  HPI:Lee Wang is a 74 y.o. male who presents w removal of deep hardware right ankle.  Patient initially underwent closed reduction of the right ankle secondary to initial presentation with fracture blisters.  Subsequently after soft tissues stabilized patient underwent internal fixation.  Patient subsequently had wound breakdown the hardware was removed and external fixator was attempted.  Due to poor bone quality the external fixator did not have good purchase in the calcaneus and the external fixation was removed.  Due to wound breakdown patient is seen at this time in consultation for Dr. Amedeo [name] for limb salvage intervention.  Patient has greater than 50 pack-year history of tobacco use states he just stopped smoking 2 months ago.  Past Medical History:  Diagnosis Date  . Anxiety   . Arthritis   . Cancer [facility]) prostate   2013  . Depression   . Heavy drinker of alcohol   . Hyperlipidemia   . Hypertension    Past Surgical History:  Procedure Laterality Date  . ANKLE CLOSED REDUCTION Right 11/20/2019   Procedure: CLOSED REDUCTION ANKLE;  Surgeon: Mordecai Rasmussen, MD;  Location: [facility];  Service: Orthopedics;  Laterality: Right;  . CATARACT EXTRACTION W/PHACO Right 08/21/2013   Procedure: CATARACT EXTRACTION PHACO AND INTRAOCULAR LENS PLACEMENT (IOC);  Surgeon: Tonny Branch, MD;  Location: [facility];  Service: Ophthalmology;  Laterality: Right;  CDE 7.34  . CATARACT EXTRACTION W/PHACO Left 09/14/2013   Procedure: CATARACT EXTRACTION PHACO AND INTRAOCULAR LENS PLACEMENT (IOC);  Surgeon: Tonny Branch, MD;  Location: [facility];  Service: Ophthalmology;  Laterality: Left;  CDE: 11.50  . COLONOSCOPY  2011   Recc repeat 3 years external / anal canal hemorrhoids, otherwise normal rectum. 2. multiple colonic polyps with largest ulcerated pedunculated polyp in  the midsigmoid, status post multiple  hot snare polypectomies. no evidence of colitis or diverticuliosis.   Marland Kitchen EXTERNAL FIXATION LEG Right 12/14/2019   Procedure: ATTEMPTED EXTERNAL FIXATION RIGHT ANKLE;  Surgeon: Mordecai Rasmussen, MD;  Location: [facility];  Service: Orthopedics;  Laterality: Right;  . FRACTURE SURGERY Bilateral    ankle  . HARDWARE REMOVAL Right 11/30/2019   Procedure: HARDWARE REMOVAL; right fibula plate and screws;  Surgeon: Mordecai Rasmussen, MD;  Location: [facility];  Service: Orthopedics;  Laterality: Right;  . HARDWARE REMOVAL Right 12/14/2019   Procedure: HARDWARE REMOVAL OF RIGHT ANKLE;  Surgeon: Mordecai Rasmussen, MD;  Location: [facility];  Service: Orthopedics;  Laterality: Right;  . IRRIGATION AND DEBRIDEMENT FOOT Right 12/14/2019   Procedure: IRRIGATION AND DEBRIDEMENT RIGHT ANKLE;  Surgeon: Mordecai Rasmussen, MD;  Location: [facility];  Service: Orthopedics;  Laterality: Right;  . ORIF ANKLE FRACTURE Right 11/30/2019   Procedure: Operative Fixation of Right Ankle Fracture;  Surgeon: Mordecai Rasmussen, MD;  Location: [facility];  Service: Orthopedics;  Laterality: Right;  ORIF medial mal; revision ORIF fibula  . ORIF HUMERUS FRACTURE Left 10/14/2016   Procedure: OPEN REDUCTION INTERNAL FIXATION (ORIF) LEFT SHOULDER FRACTURE/DISLOCATION, OPEN REDUCTION INTERNAL FIXATION GREATER TUBEROSITY;  Surgeon: Marybelle Killings, MD;  Location: [place];  Service: Orthopedics;  Laterality: Left;  . PROSTATE SURGERY     radiation implants   Social History   Socioeconomic History  . Marital status: Divorced    Spouse name: Not on file  . Number of children: Not on file  . Years of education: Not on file  .  Highest education level: Not on file  Occupational History  . Not on file  Tobacco Use  . Smoking status: Current Every Day Smoker    Packs/day: 1.00    Years: 50.00    Pack years: 50.00    Types: Cigarettes    Start date: 08/09/1958  . Smokeless tobacco: Current User    Types: Chew  Vaping Use  . Vaping  Use: Never used  Substance and Sexual Activity  . Alcohol use: Yes    Alcohol/week: 10.0 standard drinks    Types: 10 Standard drinks or equivalent per week    Comment: drinks at least once a week, minimum 6 drinks per occurrence.  . Drug use: No  . Sexual activity: Not Currently  Other Topics Concern  . Not on file  Social History Narrative  . Not on file   Social Determinants of Health   Financial Resource Strain:   . Difficulty of Paying Living Expenses: Not on file  Food Insecurity:   . Worried About Charity fundraiser in the Last Year: Not on file  . Ran Out of Food in the Last Year: Not on file  Transportation Needs:   . Lack of Transportation (Medical): Not on file  . Lack of Transportation (Non-Medical): Not on file  Physical Activity:   . Days of Exercise per Week: Not on file  . Minutes of Exercise per Session: Not on file  Stress:   . Feeling of Stress : Not on file  Social Connections:   . Frequency of Communication with Friends and Family: Not on file  . Frequency of Social Gatherings with Friends and Family: Not on file  . Attends Religious Services: Not on file  . Active Member of Clubs or Organizations: Not on file  . Attends Archivist Meetings: Not on file  . Marital Status: Not on file   Family History  Problem Relation Age of Onset  . Alzheimer's disease Mother   . Heart disease Father   . Alzheimer's disease Paternal Grandfather   . Colon cancer Neg Hx   . Liver disease Neg Hx    - negative except otherwise stated in the family history section No Known Allergies Prior to Admission medications   Medication Sig Start Date End Date Taking? Authorizing Provider  ALPRAZolam Duanne Moron) 0.5 MG tablet Take 1 tablet (0.5 mg total) by mouth 2 (two) times daily as needed. for anxiety 11/22/19  Yes Kathie Dike, MD  aspirin EC 81 MG tablet Take 1 tablet (81 mg total) by mouth in the morning and at bedtime. 11/30/19 01/11/20 Yes Mordecai Rasmussen, MD   atorvastatin (LIPITOR) 40 MG tablet Take 1 tablet (40 mg total) by mouth daily. 03/08/15  Yes Claretta Fraise, MD  docusate sodium (COLACE) 100 MG capsule Take 1 capsule (100 mg total) by mouth daily as needed for moderate constipation (While taking narcotic; hold for loose stools). 11/30/19 12/30/19 Yes Mordecai Rasmussen, MD  escitalopram (LEXAPRO) 10 MG tablet Take 1 tablet (10 mg total) by mouth daily. 09/19/15  Yes Dettinger, Fransisca Kaufmann, MD  propranolol (INDERAL) 20 MG tablet Take 0.5 tablets (10 mg total) by mouth 3 (three) times daily. 11/22/19  Yes Kathie Dike, MD   No results found. - pertinent xrays, CT, MRI studies were reviewed and independently interpreted  Positive ROS: All other systems have been reviewed and were otherwise negative with the exception of those mentioned in the HPI and as above.  Physical Exam: General: Alert, no  acute distress Psychiatric: Patient is competent for consent with normal mood and affect Lymphatic: No axillary or cervical lymphadenopathy Cardiovascular: No pedal edema Respiratory: No cyanosis, no use of accessory musculature GI: No organomegaly, abdomen is soft and non-tender    Images:  @ENCIMAGES @  Labs:  No results found for: HGBA1C, ESRSEDRATE, CRP, LABURIC, REPTSTATUS, GRAMSTAIN, CULT, LABORGA  Lab Results  Component Value Date   ALBUMIN 3.6 11/20/2019   ALBUMIN 3.6 10/14/2016   ALBUMIN 4.1 03/14/2015    Neurologic: Patient does not have protective sensation bilateral lower extremities.   MUSCULOSKELETAL:   Skin: Examination patient has dermatitis with breakdown of the medial and lateral incision.  There is no gangrenous changes no purulence.  Patient has a palpable dorsalis pedis pulse.  The Doppler was used and patient does have a strong biphasic dorsalis pedis and posterior tibial pulse no indication of arterial insufficiency at the ankle.  Radiographs are reviewed which shows impaction of the distal tibia with subluxation of  the tibiotalar joint.  Assessment: Assessment: Wound breakdown status post internal fixation right ankle fracture with removal of the deep retained hardware with good macro circulation of the ankle.  Plan: We will plan for a tibial calcaneal fusion for limb salvage.  Discussed with the patient with his longstanding history of tobacco use and history of hypertension he is an increased risk of the wound not healing and potential for transtibial amputation.  Patient states he understands wished to proceed with surgery we will plan for surgery on Wednesday.  Thank you for the consult and the opportunity to see Lee Wang, [place] [phone] 12:42 PM

## 2019-12-18 NOTE — H&P (View-Only) (Signed)
ORTHOPAEDIC CONSULTATION  REQUESTING PHYSICIAN: Lavina Hamman, MD  Chief Complaint: Dehiscence.internal fixation right ankle Fracture.  HPI:ith Lee Wang is a 74 y.o. male who presents w removal of deep hardware right ankle.  Patient initially underwent closed reduction of the right ankle secondary to initial presentation with fracture blisters.  Subsequently after soft tissues stabilized patient underwent internal fixation.  Patient subsequently had wound breakdown the hardware was removed and external fixator was attempted.  Due to poor bone quality the external fixator did not have good purchase in the calcaneus and the external fixation was removed.  Due to wound breakdown patient is seen at this time in consultation for Dr. Amedeo [name] for limb salvage intervention.  Patient has greater than 50 pack-year history of tobacco use states he just stopped smoking 2 months ago.  Past Medical History:  Diagnosis Date  . Anxiety   . Arthritis   . Cancer [facility]) prostate   2013  . Depression   . Heavy drinker of alcohol   . Hyperlipidemia   . Hypertension    Past Surgical History:  Procedure Laterality Date  . ANKLE CLOSED REDUCTION Right 11/20/2019   Procedure: CLOSED REDUCTION ANKLE;  Surgeon: Mordecai Rasmussen, MD;  Location: [facility];  Service: Orthopedics;  Laterality: Right;  . CATARACT EXTRACTION W/PHACO Right 08/21/2013   Procedure: CATARACT EXTRACTION PHACO AND INTRAOCULAR LENS PLACEMENT (IOC);  Surgeon: Tonny Branch, MD;  Location: [facility];  Service: Ophthalmology;  Laterality: Right;  CDE 7.34  . CATARACT EXTRACTION W/PHACO Left 09/14/2013   Procedure: CATARACT EXTRACTION PHACO AND INTRAOCULAR LENS PLACEMENT (IOC);  Surgeon: Tonny Branch, MD;  Location: [facility];  Service: Ophthalmology;  Laterality: Left;  CDE: 11.50  . COLONOSCOPY  2011   Recc repeat 3 years external / anal canal hemorrhoids, otherwise normal rectum. 2. multiple colonic polyps with largest ulcerated pedunculated polyp in  the midsigmoid, status post multiple  hot snare polypectomies. no evidence of colitis or diverticuliosis.   Marland Kitchen EXTERNAL FIXATION LEG Right 12/14/2019   Procedure: ATTEMPTED EXTERNAL FIXATION RIGHT ANKLE;  Surgeon: Mordecai Rasmussen, MD;  Location: [facility];  Service: Orthopedics;  Laterality: Right;  . FRACTURE SURGERY Bilateral    ankle  . HARDWARE REMOVAL Right 11/30/2019   Procedure: HARDWARE REMOVAL; right fibula plate and screws;  Surgeon: Mordecai Rasmussen, MD;  Location: [facility];  Service: Orthopedics;  Laterality: Right;  . HARDWARE REMOVAL Right 12/14/2019   Procedure: HARDWARE REMOVAL OF RIGHT ANKLE;  Surgeon: Mordecai Rasmussen, MD;  Location: [facility];  Service: Orthopedics;  Laterality: Right;  . IRRIGATION AND DEBRIDEMENT FOOT Right 12/14/2019   Procedure: IRRIGATION AND DEBRIDEMENT RIGHT ANKLE;  Surgeon: Mordecai Rasmussen, MD;  Location: [facility];  Service: Orthopedics;  Laterality: Right;  . ORIF ANKLE FRACTURE Right 11/30/2019   Procedure: Operative Fixation of Right Ankle Fracture;  Surgeon: Mordecai Rasmussen, MD;  Location: [facility];  Service: Orthopedics;  Laterality: Right;  ORIF medial mal; revision ORIF fibula  . ORIF HUMERUS FRACTURE Left 10/14/2016   Procedure: OPEN REDUCTION INTERNAL FIXATION (ORIF) LEFT SHOULDER FRACTURE/DISLOCATION, OPEN REDUCTION INTERNAL FIXATION GREATER TUBEROSITY;  Surgeon: Marybelle Killings, MD;  Location: [place];  Service: Orthopedics;  Laterality: Left;  . PROSTATE SURGERY     radiation implants   Social History   Socioeconomic History  . Marital status: Divorced    Spouse name: Not on file  . Number of children: Not on file  . Years of education: Not on file  .  Highest education level: Not on file  Occupational History  . Not on file  Tobacco Use  . Smoking status: Current Every Day Smoker    Packs/day: 1.00    Years: 50.00    Pack years: 50.00    Types: Cigarettes    Start date: 08/09/1958  . Smokeless tobacco: Current User    Types: Chew  Vaping Use  . Vaping  Use: Never used  Substance and Sexual Activity  . Alcohol use: Yes    Alcohol/week: 10.0 standard drinks    Types: 10 Standard drinks or equivalent per week    Comment: drinks at least once a week, minimum 6 drinks per occurrence.  . Drug use: No  . Sexual activity: Not Currently  Other Topics Concern  . Not on file  Social History Narrative  . Not on file   Social Determinants of Health   Financial Resource Strain:   . Difficulty of Paying Living Expenses: Not on file  Food Insecurity:   . Worried About Charity fundraiser in the Last Year: Not on file  . Ran Out of Food in the Last Year: Not on file  Transportation Needs:   . Lack of Transportation (Medical): Not on file  . Lack of Transportation (Non-Medical): Not on file  Physical Activity:   . Days of Exercise per Week: Not on file  . Minutes of Exercise per Session: Not on file  Stress:   . Feeling of Stress : Not on file  Social Connections:   . Frequency of Communication with Friends and Family: Not on file  . Frequency of Social Gatherings with Friends and Family: Not on file  . Attends Religious Services: Not on file  . Active Member of Clubs or Organizations: Not on file  . Attends Archivist Meetings: Not on file  . Marital Status: Not on file   Family History  Problem Relation Age of Onset  . Alzheimer's disease Mother   . Heart disease Father   . Alzheimer's disease Paternal Grandfather   . Colon cancer Neg Hx   . Liver disease Neg Hx    - negative except otherwise stated in the family history section No Known Allergies Prior to Admission medications   Medication Sig Start Date End Date Taking? Authorizing Provider  ALPRAZolam Duanne Moron) 0.5 MG tablet Take 1 tablet (0.5 mg total) by mouth 2 (two) times daily as needed. for anxiety 11/22/19  Yes Kathie Dike, MD  aspirin EC 81 MG tablet Take 1 tablet (81 mg total) by mouth in the morning and at bedtime. 11/30/19 01/11/20 Yes Mordecai Rasmussen, MD    atorvastatin (LIPITOR) 40 MG tablet Take 1 tablet (40 mg total) by mouth daily. 03/08/15  Yes Claretta Fraise, MD  docusate sodium (COLACE) 100 MG capsule Take 1 capsule (100 mg total) by mouth daily as needed for moderate constipation (While taking narcotic; hold for loose stools). 11/30/19 12/30/19 Yes Mordecai Rasmussen, MD  escitalopram (LEXAPRO) 10 MG tablet Take 1 tablet (10 mg total) by mouth daily. 09/19/15  Yes Dettinger, Fransisca Kaufmann, MD  propranolol (INDERAL) 20 MG tablet Take 0.5 tablets (10 mg total) by mouth 3 (three) times daily. 11/22/19  Yes Kathie Dike, MD   No results found. - pertinent xrays, CT, MRI studies were reviewed and independently interpreted  Positive ROS: All other systems have been reviewed and were otherwise negative with the exception of those mentioned in the HPI and as above.  Physical Exam: General: Alert,  no acute distress Psychiatric: Patient is competent for consent with normal mood and affect Lymphatic: No axillary or cervical lymphadenopathy Cardiovascular: No pedal edema Respiratory: No cyanosis, no use of accessory musculature GI: No organomegaly, abdomen is soft and non-tender    Images:  @ENCIMAGES @  Labs:  No results found for: HGBA1C, ESRSEDRATE, CRP, LABURIC, REPTSTATUS, GRAMSTAIN, CULT, LABORGA  Lab Results  Component Value Date   ALBUMIN 3.6 11/20/2019   ALBUMIN 3.6 10/14/2016   ALBUMIN 4.1 03/14/2015    Neurologic: Patient does not have protective sensation bilateral lower extremities.   MUSCULOSKELETAL:   Skin: Examination patient has dermatitis with breakdown of the medial and lateral incision.  There is no gangrenous changes no purulence.  Patient has a palpable dorsalis pedis pulse.  The Doppler was used and patient does have a strong biphasic dorsalis pedis and posterior tibial pulse no indication of arterial insufficiency at the ankle.  Radiographs are reviewed which shows impaction of the distal tibia with subluxation of  the tibiotalar joint.  Assessment: Assessment: Wound breakdown status post internal fixation right ankle fracture with removal of the deep retained hardware with good macro circulation of the ankle.  Plan: We will plan for a tibial calcaneal fusion for limb salvage.  Discussed with the patient with his longstanding history of tobacco use and history of hypertension he is an increased risk of the wound not healing and potential for transtibial amputation.  Patient states he understands wished to proceed with surgery we will plan for surgery on Wednesday.  Thank you for the consult and the opportunity to see Mr. Gwinda Passe, [place] [phone] 12:42 PM

## 2019-12-18 NOTE — TOC CAGE-AID Note (Signed)
Transition of Care [facility]) - CAGE-AID Screening   Patient Details  Name: Lee Wang MRN: [id number] Date of Birth: Jul 29, 1945  Transition of Care [facility]) CM/SW Contact:    Emeterio Reeve, [place] Phone Number: 12/18/2019, 3:48 PM   Clinical Narrative:  Pt unable to participate in assessment due to only being oriented to person.   CAGE-AID Screening: Substance Abuse Screening unable to be completed due to: : Patient unable to participate               Providence [place]by Clinical Social Worker [phone]

## 2019-12-18 NOTE — Progress Notes (Signed)
PT Cancellation Note  Patient Details Name: Lee Wang MRN: [id number] DOB: [birth year]   Cancelled Treatment:    Reason Eval/Treat Not Completed: Other (comment). Spoke with Dr. Sharol Given, plan is for patient to go for ankle fusion on 11/10 by Dr. Sharol Given. Dr. Sharol Given asked [place] to wait to mobilize him until after surgery. PT to return s/p surgery when appropriate to advance mobility.  Kittie Plater, PT, DPT Acute Rehabilitation Services Pager #: [phone] Office #: [phone]    Berline Lopes 12/18/2019, 12:41 PM

## 2019-12-19 ENCOUNTER — Other Ambulatory Visit: Payer: Self-pay | Admitting: Physician Assistant

## 2019-12-19 DIAGNOSIS — S82891S Other fracture of right lower leg, sequela: Secondary | ICD-10-CM | POA: Diagnosis not present

## 2019-12-19 LAB — BASIC METABOLIC PANEL
Anion gap: 10 (ref 5–15)
Anion gap: 7 (ref 5–15)
Anion gap: 7 (ref 5–15)
Anion gap: 5 (ref 5–15)
Anion gap: 10 (ref 5–15)
BUN: 5 mg/dL — ABNORMAL LOW (ref 8–23)
BUN: <5 mg/dL — ABNORMAL LOW (ref 8–23)
BUN: 5 mg/dL — ABNORMAL LOW (ref 8–23)
BUN: <5 mg/dL — ABNORMAL LOW (ref 8–23)
BUN: 6 mg/dL — ABNORMAL LOW (ref 8–23)
CO2: 21 mmol/L — ABNORMAL LOW (ref 22–32)
CO2: 23 mmol/L (ref 22–32)
CO2: 24 mmol/L (ref 22–32)
CO2: 25 mmol/L (ref 22–32)
CO2: 22 mmol/L (ref 22–32)
Calcium: 8.6 mg/dL — ABNORMAL LOW (ref 8.9–10.3)
Calcium: 8.7 mg/dL — ABNORMAL LOW (ref 8.9–10.3)
Calcium: 8.7 mg/dL — ABNORMAL LOW (ref 8.9–10.3)
Calcium: 8.5 mg/dL — ABNORMAL LOW (ref 8.9–10.3)
Calcium: 8.7 mg/dL — ABNORMAL LOW (ref 8.9–10.3)
Chloride: 93 mmol/L — ABNORMAL LOW (ref 98–111)
Chloride: 95 mmol/L — ABNORMAL LOW (ref 98–111)
Chloride: 94 mmol/L — ABNORMAL LOW (ref 98–111)
Chloride: 94 mmol/L — ABNORMAL LOW (ref 98–111)
Chloride: 95 mmol/L — ABNORMAL LOW (ref 98–111)
Creatinine, Ser: 0.53 mg/dL — ABNORMAL LOW (ref 0.61–1.24)
Creatinine, Ser: 0.47 mg/dL — ABNORMAL LOW (ref 0.61–1.24)
Creatinine, Ser: 0.53 mg/dL — ABNORMAL LOW (ref 0.61–1.24)
Creatinine, Ser: 0.57 mg/dL — ABNORMAL LOW (ref 0.61–1.24)
Creatinine, Ser: 0.58 mg/dL — ABNORMAL LOW (ref 0.61–1.24)
GFR, Estimated: >60 mL/min (ref >60)
GFR, Estimated: >60 mL/min (ref >60)
GFR, Estimated: >60 mL/min (ref >60)
GFR, Estimated: >60 mL/min (ref >60)
GFR, Estimated: >60 mL/min (ref >60)
Glucose, Bld: 102 mg/dL — ABNORMAL HIGH (ref 70–99)
Glucose, Bld: 99 mg/dL (ref 70–99)
Glucose, Bld: 83 mg/dL (ref 70–99)
Glucose, Bld: 137 mg/dL — ABNORMAL HIGH (ref 70–99)
Glucose, Bld: 99 mg/dL (ref 70–99)
Potassium: 4.1 mmol/L (ref 3.5–5.1)
Potassium: 4 mmol/L (ref 3.5–5.1)
Potassium: 4.1 mmol/L (ref 3.5–5.1)
Potassium: 4 mmol/L (ref 3.5–5.1)
Potassium: 4.2 mmol/L (ref 3.5–5.1)
Sodium: 124 mmol/L — ABNORMAL LOW (ref 135–145)
Sodium: 125 mmol/L — ABNORMAL LOW (ref 135–145)
Sodium: 125 mmol/L — ABNORMAL LOW (ref 135–145)
Sodium: 124 mmol/L — ABNORMAL LOW (ref 135–145)
Sodium: 127 mmol/L — ABNORMAL LOW (ref 135–145)

## 2019-12-19 LAB — NA AND K (SODIUM & POTASSIUM), RAND UR
Potassium Urine: 22 mmol/L
Sodium, Ur: 122 mmol/L

## 2019-12-19 LAB — OSMOLALITY: Osmolality: 259 mOsm/kg — ABNORMAL LOW (ref 275–295)

## 2019-12-19 LAB — CORTISOL: Cortisol, Plasma: 14.2 ug/dL

## 2019-12-19 LAB — CREATININE, URINE, RANDOM: Creatinine, Urine: 35.94 mg/dL

## 2019-12-19 LAB — OSMOLALITY, URINE: Osmolality, Ur: 335 mOsm/kg (ref 300–900)

## 2019-12-19 MED ORDER — SODIUM CHLORIDE 1 G PO TABS
2.0000 g | ORAL_TABLET | Freq: Three times a day (TID) | ORAL | Status: DC
  Administered 2019-12-19 – 2019-12-26 (×19): 2 g via ORAL
  Filled 2019-12-19 (×20): qty 2

## 2019-12-19 NOTE — Progress Notes (Signed)
[facility] Progress Note  Patient: Lee Wang    IOX:[id number]  DOA: 12/13/2019     Date of Service: the patient was seen and examined on 12/19/2019  Brief hospital course: 74 year old male with past medical history relevant for h/o Etoh abuse, BPH, prostate cancer , tobacco abuse, HLD and HTN who had a witnessed mechanical fall after missing a step on 11/19/2019 subsequently imaging studies demonstrated fracture dislocation of the ankle and acute fracture (avulsion) through the base of the fifth metatarsal, he underwent Closed reduction and splinting of bimalleolar right ankle fracture on 11/20/19, on 11/30/2019 patient underwent-Removal of pre-existing plate and screws from the fibula with revision ORIF of the fibula with a locking plate. ORIF of the vertical shear fracture of the medial malleolus -On 12/14/2019 patient was found to have failure of hardware with essentially conversion of his close fractures and open fracture, on 12/14/19 patient underwent Removal of hardware from right ankle,irrigation and debridement, unsuccessful attempt at placement of external fixation and splint application Transferred to [place] under hospitalist service for further care.  Currently plan is treat hyponatremia.  Right tibiocalcaneal fusion tomorrow.  Assessment and Plan: 1.  Hypoosmolar hyponatremia.  Symptomatic. Acute metabolic encephalopathy. From excessive alcohol intake as well as poor p.o. intake otherwise. Currently somewhat confused.  Unable to tell me the name of the president. No focal deficit on my examination. No headache.  No dizziness. No chest pain. Sodium remains low. Discontinue IV fluid. Add Ensure.  Add salt tablets. Hold Lexapro.  2.  Right ankle fracture with failed hardware. Presented with an open wound with failed hardware on 12/14/2019. Underwent removal of the hardware, irrigation and debridement as well as unsuccessful attempt at placement of external  fixation on 12/14/2019. Patient was on IV antibiotics. Patient is currently transferred to [facility] for ankle fusion by Dr. Sharol Given tentatively scheduled for Wednesday, 12/20/2019.  3.  Acute on chronic iron deficiency anemia. Postop acute blood loss anemia. Likely component of hemodilution as well. H&H relatively stable for now. Iron level is low. Transfuse for hemoglobin less than 7. Continue iron supplementation.  4.  Alcohol abuse Currently low risk for DVT. Continue close monitoring. Has been at [facility] since recent hospitalization.  5.  Tobacco abuse. Holding off on nicotine patch due to confusion.  Monitor.  6.  Depression and anxiety. No suicidal ideation. No significant anxiety as well. Lexapro on hold.  7.  Essential hypertension. Patient is on propranolol which is currently on hold.  8.  Incidentally RSV positive. Continue droplet precaution.  No respiratory symptoms for now.  Diet: Regular diet. DVT Prophylaxis: Subcutaneous Lovenox      Advance goals of care discussion: Full code  Family Communication: no family was present at bedside, at the time of interview.   Disposition:  Status is: Inpatient  Remains inpatient appropriate because:Persistent severe electrolyte disturbances and Ongoing diagnostic testing needed not appropriate for outpatient work up   Dispo: The patient is from: Home              Anticipated d/c is to: SNF              Anticipated d/c date is: > 3 days              Patient currently is not medically stable to d/c.   Subjective: Denies any acute complaint.  Had some frontal headache which is chronic.  No nausea no vomiting.  No fever no chills.  No dizziness.  No focal deficit.  Oral intake is adequate.  Physical Exam:  General: Appear in mild distress, no Rash; Oral Mucosa Clear, moist. no Abnormal Neck Mass Or lumps, Conjunctiva normal  Cardiovascular: S1 and S2 Present, no Murmur, Respiratory: good respiratory effort,  Bilateral Air entry present and CTA, no Crackles, no wheezes Abdomen: Bowel Sound present, Soft and no tenderness Extremities: no Pedal edema Neurology: alert and oriented to time, place, and person affect appropriate. no new focal deficit Gait not checked due to patient safety concerns   Vitals:   12/18/19 2053 12/19/19 0300 12/19/19 0854 12/19/19 1512  BP: 124/79 (!) 143/77 130/87 107/61  Pulse: 64 66 70 63  Resp: 18 17 16 20   Temp:  99 F (37.2 C) 98.3 F (36.8 C) 99.5 F (37.5 C)  TempSrc:  Oral Oral Oral  SpO2: 98% 100% 99% 98%  Weight:      Height:        Intake/Output Summary (Last 24 hours) at 12/19/2019 1622 Last data filed at 12/19/2019 0554 Gross per 24 hour  Intake 500 ml  Output 300 ml  Net 200 ml   Filed Weights   12/13/19 1302 12/17/19 1248  Weight: 56.2 kg 64.3 kg    Data Reviewed: I have personally reviewed and interpreted daily labs, tele strips, imagings as discussed above. I reviewed all nursing notes, pharmacy notes, vitals, pertinent old records I have discussed plan of care as described above with RN and patient/family.  CBC: Recent Labs  Lab 12/13/19 1341 12/15/19 0938 12/17/19 0555 12/18/19 0230  WBC 10.3 8.0 6.6 6.0  HGB 11.2* 8.3* 7.9* 7.5*  HCT 33.4* 24.7* 22.8* 22.2*  MCV 96.8 98.8 95.4 94.1  PLT 323 302 283 035   Basic Metabolic Panel: Recent Labs  Lab 12/18/19 1301 12/18/19 2133 12/19/19 0538 12/19/19 0817 12/19/19 1248  NA 126* 124* 124* 125* 125*  K 4.0 4.0 4.1 4.0 4.1  CL 95* 95* 93* 95* 94*  CO2 24 22 21* 23 24  GLUCOSE 99 121* 102* 99 83  BUN <5* 5* 5* <5* 5*  CREATININE 0.48* 0.59* 0.53* 0.47* 0.53*  CALCIUM 8.5* 8.4* 8.6* 8.7* 8.7*    Studies: No results found.  Scheduled Meds: . aspirin EC  81 mg Oral BID  . atorvastatin  40 mg Oral Daily  . feeding supplement  237 mL Oral BID BM  . ferrous sulfate  325 mg Oral BID WC  . folic acid  1 mg Oral Daily  . multivitamin with minerals  1 tablet Oral Daily  .  propranolol ER  60 mg Oral Daily  . sodium chloride  1 g Oral BID WC  . thiamine  100 mg Oral Daily   Or  . thiamine  100 mg Intravenous Daily  . vitamin B-12  1,000 mcg Oral Daily   Continuous Infusions:  PRN Meds: labetalol, senna-docusate  Time spent: 35 minutes  Author: Berle Mull, MD [facility] 12/19/2019 4:22 PM  To reach On-call, see care teams to locate the attending and reach out via [email or web address]. Between 7PM-7AM, please contact night-coverage If you still have difficulty reaching the attending provider, please page the [facility] (Director on Call) for [facility] on amion for assistance.

## 2019-12-19 NOTE — Progress Notes (Signed)
Patient is status post removal of deep hardware right ankle.  Patient was seen in full consultation with Dr. Sharol Given yesterday Dr. Sharol Given feels the best limb salvage procedure would be a tibial calcaneal fusion.  Patient is laying in bed communicates well with me says he understands the procedure   Patient has good biphasic dorsalis pedis and posterior tibial pulses per Doppler.  X-rays show impaction of the distal tibia with subluxation of the tibiotalar joint.  Reviewed the risks and recovery of the procedure.  Patient is agreeable to go forward tomorrow.  Orders have been placed patient knows he is not to eat or drink anything after midnight

## 2019-12-19 NOTE — Plan of Care (Signed)

## 2019-12-19 NOTE — Progress Notes (Signed)
Pt alert and oriented to self and place but disoriented to situation and time. Attempted to get telephone consent for surgery from son but no answer and voicemail not set up. RN was able to talk to Pt's daughter, Magda Paganini but was informed to contact her brother.

## 2019-12-19 NOTE — Plan of Care (Signed)

## 2019-12-20 ENCOUNTER — Encounter (HOSPITAL_COMMUNITY): Payer: Self-pay | Admitting: Orthopedic Surgery

## 2019-12-20 ENCOUNTER — Inpatient Hospital Stay (HOSPITAL_COMMUNITY): Payer: Medicare Other | Admitting: Anesthesiology

## 2019-12-20 ENCOUNTER — Encounter (HOSPITAL_COMMUNITY)
Admission: AD | Disposition: A | Discharge status: Skilled Nursing Facility | Payer: Self-pay | Source: Ambulatory Visit | Attending: Internal Medicine

## 2019-12-20 DIAGNOSIS — F1020 Alcohol dependence, uncomplicated: Secondary | ICD-10-CM

## 2019-12-20 DIAGNOSIS — S82891G Other fracture of right lower leg, subsequent encounter for closed fracture with delayed healing: Secondary | ICD-10-CM

## 2019-12-20 DIAGNOSIS — F1010 Alcohol abuse, uncomplicated: Secondary | ICD-10-CM

## 2019-12-20 DIAGNOSIS — R579 Shock, unspecified: Secondary | ICD-10-CM

## 2019-12-20 DIAGNOSIS — F418 Other specified anxiety disorders: Secondary | ICD-10-CM | POA: Diagnosis not present

## 2019-12-20 DIAGNOSIS — I9581 Postprocedural hypotension: Secondary | ICD-10-CM

## 2019-12-20 DIAGNOSIS — E871 Hypo-osmolality and hyponatremia: Secondary | ICD-10-CM

## 2019-12-20 HISTORY — PX: ANKLE FUSION: SHX5718

## 2019-12-20 LAB — PROCALCITONIN: Procalcitonin: <0.1 ng/mL

## 2019-12-20 LAB — CBC WITH DIFFERENTIAL/PLATELET
Abs Immature Granulocytes: 0.02 10*3/uL (ref 0.00–0.07)
Basophils Absolute: 0.1 10*3/uL (ref 0.0–0.1)
Basophils Relative: 1 %
Eosinophils Absolute: 0.1 10*3/uL (ref 0.0–0.5)
Eosinophils Relative: 2 %
HCT: 24.1 % — ABNORMAL LOW (ref 39.0–52.0)
Hemoglobin: 8.2 g/dL — ABNORMAL LOW (ref 13.0–17.0)
Immature Granulocytes: 0 %
Lymphocytes Relative: 22 %
Lymphs Abs: 1.3 10*3/uL (ref 0.7–4.0)
MCH: 32.7 pg (ref 26.0–34.0)
MCHC: 34 g/dL (ref 30.0–36.0)
MCV: 96 fL (ref 80.0–100.0)
Monocytes Absolute: 0.6 10*3/uL (ref 0.1–1.0)
Monocytes Relative: 9 %
Neutro Abs: 4 10*3/uL (ref 1.7–7.7)
Neutrophils Relative %: 66 %
Platelets: 302 10*3/uL (ref 150–400)
RBC: 2.51 MIL/uL — ABNORMAL LOW (ref 4.22–5.81)
RDW: 12.5 % (ref 11.5–15.5)
WBC: 6.1 10*3/uL (ref 4.0–10.5)
nRBC: 0 % (ref 0.0–0.2)

## 2019-12-20 LAB — CBC
HCT: 24.5 % — ABNORMAL LOW (ref 39.0–52.0)
Hemoglobin: 8.3 g/dL — ABNORMAL LOW (ref 13.0–17.0)
MCH: 32.4 pg (ref 26.0–34.0)
MCHC: 33.9 g/dL (ref 30.0–36.0)
MCV: 95.7 fL (ref 80.0–100.0)
Platelets: 310 10*3/uL (ref 150–400)
RBC: 2.56 MIL/uL — ABNORMAL LOW (ref 4.22–5.81)
RDW: 12.5 % (ref 11.5–15.5)
WBC: 6.9 10*3/uL (ref 4.0–10.5)
nRBC: 0 % (ref 0.0–0.2)

## 2019-12-20 LAB — COMPREHENSIVE METABOLIC PANEL
ALT: 20 U/L (ref 0–44)
AST: 31 U/L (ref 15–41)
Albumin: 2.5 g/dL — ABNORMAL LOW (ref 3.5–5.0)
Alkaline Phosphatase: 132 U/L — ABNORMAL HIGH (ref 38–126)
Anion gap: 5 (ref 5–15)
BUN: <5 mg/dL — ABNORMAL LOW (ref 8–23)
CO2: 25 mmol/L (ref 22–32)
Calcium: 8.7 mg/dL — ABNORMAL LOW (ref 8.9–10.3)
Chloride: 96 mmol/L — ABNORMAL LOW (ref 98–111)
Creatinine, Ser: 0.52 mg/dL — ABNORMAL LOW (ref 0.61–1.24)
GFR, Estimated: >60 mL/min (ref >60)
Glucose, Bld: 88 mg/dL (ref 70–99)
Potassium: 4.1 mmol/L (ref 3.5–5.1)
Sodium: 126 mmol/L — ABNORMAL LOW (ref 135–145)
Total Bilirubin: 1.1 mg/dL (ref 0.3–1.2)
Total Protein: 5.4 g/dL — ABNORMAL LOW (ref 6.5–8.1)

## 2019-12-20 LAB — BASIC METABOLIC PANEL
Anion gap: 7 (ref 5–15)
BUN: <5 mg/dL — ABNORMAL LOW (ref 8–23)
CO2: 24 mmol/L (ref 22–32)
Calcium: 8.7 mg/dL — ABNORMAL LOW (ref 8.9–10.3)
Chloride: 96 mmol/L — ABNORMAL LOW (ref 98–111)
Creatinine, Ser: 0.54 mg/dL — ABNORMAL LOW (ref 0.61–1.24)
GFR, Estimated: >60 mL/min (ref >60)
Glucose, Bld: 90 mg/dL (ref 70–99)
Potassium: 3.9 mmol/L (ref 3.5–5.1)
Sodium: 127 mmol/L — ABNORMAL LOW (ref 135–145)

## 2019-12-20 LAB — MAGNESIUM: Magnesium: 1.7 mg/dL (ref 1.7–2.4)

## 2019-12-20 LAB — ABO/RH: ABO/RH(D): O NEG

## 2019-12-20 LAB — MRSA PCR SCREENING: MRSA by PCR: NEGATIVE

## 2019-12-20 LAB — PHOSPHORUS: Phosphorus: 3.4 mg/dL (ref 2.5–4.6)

## 2019-12-20 SURGERY — ANKLE FUSION
Anesthesia: Monitor Anesthesia Care | Site: Ankle | Laterality: Right

## 2019-12-20 MED ORDER — DOCUSATE SODIUM 100 MG PO CAPS
100.0000 mg | ORAL_CAPSULE | Freq: Two times a day (BID) | ORAL | Status: DC
  Administered 2019-12-20 – 2019-12-25 (×9): 100 mg via ORAL
  Filled 2019-12-20 (×10): qty 1

## 2019-12-20 MED ORDER — MIDAZOLAM HCL 2 MG/2ML IJ SOLN
INTRAMUSCULAR | Status: AC
  Administered 2019-12-20: 1 mg via INTRAVENOUS
  Filled 2019-12-20: qty 2

## 2019-12-20 MED ORDER — METOCLOPRAMIDE HCL 5 MG/ML IJ SOLN: 5.0000 mg | Freq: Three times a day (TID) | INTRAMUSCULAR | Status: DC | PRN

## 2019-12-20 MED ORDER — LIDOCAINE 2% (20 MG/ML) 5 ML SYRINGE
INTRAMUSCULAR | Status: DC | PRN
  Administered 2019-12-20: 40 mg via INTRAVENOUS

## 2019-12-20 MED ORDER — METOCLOPRAMIDE HCL 5 MG PO TABS
5.0000 mg | ORAL_TABLET | Freq: Three times a day (TID) | ORAL | Status: DC | PRN
  Filled 2019-12-20: qty 2

## 2019-12-20 MED ORDER — MIDAZOLAM HCL 2 MG/2ML IJ SOLN: 1.0000 mg | Freq: Once | INTRAMUSCULAR | Status: AC

## 2019-12-20 MED ORDER — ONDANSETRON HCL 4 MG/2ML IJ SOLN: 4.0000 mg | Freq: Four times a day (QID) | INTRAMUSCULAR | Status: DC | PRN

## 2019-12-20 MED ORDER — PHENYLEPHRINE HCL-NACL 10-0.9 MG/250ML-% IV SOLN
INTRAVENOUS | Status: DC | PRN
  Administered 2019-12-20: 25 ug/min via INTRAVENOUS

## 2019-12-20 MED ORDER — EPHEDRINE SULFATE-NACL 50-0.9 MG/10ML-% IV SOSY
PREFILLED_SYRINGE | INTRAVENOUS | Status: DC | PRN
  Administered 2019-12-20: 10 mg via INTRAVENOUS

## 2019-12-20 MED ORDER — ROPIVACAINE HCL 7.5 MG/ML IJ SOLN
INTRAMUSCULAR | Status: DC | PRN
  Administered 2019-12-20: 10 mL via PERINEURAL

## 2019-12-20 MED ORDER — ALBUMIN HUMAN 5 % IV SOLN: INTRAVENOUS | Status: DC | PRN

## 2019-12-20 MED ORDER — LIDOCAINE 2% (20 MG/ML) 5 ML SYRINGE
INTRAMUSCULAR | Status: AC
  Filled 2019-12-20: qty 5

## 2019-12-20 MED ORDER — HYDROMORPHONE HCL 1 MG/ML IJ SOLN: 0.5000 mg | INTRAMUSCULAR | Status: DC | PRN

## 2019-12-20 MED ORDER — PROPOFOL 10 MG/ML IV BOLUS
INTRAVENOUS | Status: AC
  Filled 2019-12-20: qty 20

## 2019-12-20 MED ORDER — OXYCODONE HCL 5 MG PO TABS: 5.0000 mg | ORAL_TABLET | ORAL | Status: DC | PRN

## 2019-12-20 MED ORDER — PROPOFOL 10 MG/ML IV BOLUS
INTRAVENOUS | Status: DC | PRN
  Administered 2019-12-20: 40 mg via INTRAVENOUS

## 2019-12-20 MED ORDER — LIDOCAINE-EPINEPHRINE (PF) 1.5 %-1:200000 IJ SOLN
INTRAMUSCULAR | Status: DC | PRN
  Administered 2019-12-20 (×2): 5 mL via PERINEURAL

## 2019-12-20 MED ORDER — CHLORHEXIDINE GLUCONATE CLOTH 2 % EX PADS
6.0000 | MEDICATED_PAD | Freq: Every day | CUTANEOUS | Status: DC
  Administered 2019-12-20 – 2019-12-26 (×7): 6 via TOPICAL

## 2019-12-20 MED ORDER — EPHEDRINE 5 MG/ML INJ
INTRAVENOUS | Status: AC
  Filled 2019-12-20: qty 10

## 2019-12-20 MED ORDER — CHLORHEXIDINE GLUCONATE 0.12 % MT SOLN
OROMUCOSAL | Status: AC
  Administered 2019-12-20: 15 mL via OROMUCOSAL
  Filled 2019-12-20: qty 15

## 2019-12-20 MED ORDER — BUPIVACAINE-EPINEPHRINE (PF) 0.5% -1:200000 IJ SOLN
INTRAMUSCULAR | Status: DC | PRN
  Administered 2019-12-20: 25 mL

## 2019-12-20 MED ORDER — PHENYLEPHRINE HCL-NACL 10-0.9 MG/250ML-% IV SOLN
25.0000 ug/min | INTRAVENOUS | Status: DC
  Administered 2019-12-20: 100 ug/min via INTRAVENOUS
  Administered 2019-12-20: 70 ug/min via INTRAVENOUS
  Administered 2019-12-21 (×2): 40 ug/min via INTRAVENOUS
  Administered 2019-12-21: 55 ug/min via INTRAVENOUS
  Administered 2019-12-21: 70 ug/min via INTRAVENOUS
  Administered 2019-12-22: 30 ug/min via INTRAVENOUS
  Filled 2019-12-20 (×8): qty 250

## 2019-12-20 MED ORDER — GLYCOPYRROLATE PF 0.2 MG/ML IJ SOSY
PREFILLED_SYRINGE | INTRAMUSCULAR | Status: AC
  Filled 2019-12-20: qty 1

## 2019-12-20 MED ORDER — PHENYLEPHRINE HCL (PRESSORS) 10 MG/ML IV SOLN
INTRAVENOUS | Status: DC | PRN
  Administered 2019-12-20 (×3): 80 ug via INTRAVENOUS
  Administered 2019-12-20 (×3): 120 ug via INTRAVENOUS

## 2019-12-20 MED ORDER — GLYCOPYRROLATE PF 0.2 MG/ML IJ SOSY
PREFILLED_SYRINGE | INTRAMUSCULAR | Status: DC | PRN
  Administered 2019-12-20 (×2): .1 mg via INTRAVENOUS

## 2019-12-20 MED ORDER — FENTANYL CITRATE (PF) 100 MCG/2ML IJ SOLN
INTRAMUSCULAR | Status: AC
  Administered 2019-12-20: 100 ug via INTRAVENOUS
  Filled 2019-12-20: qty 2

## 2019-12-20 MED ORDER — SODIUM CHLORIDE 0.9 % IV SOLN: INTRAVENOUS | Status: DC

## 2019-12-20 MED ORDER — SODIUM CHLORIDE 0.9 % IV SOLN: 250.0000 mL | INTRAVENOUS | Status: DC

## 2019-12-20 MED ORDER — SODIUM CHLORIDE 0.9% IV SOLUTION: Freq: Once | INTRAVENOUS | Status: AC

## 2019-12-20 MED ORDER — ONDANSETRON HCL 4 MG PO TABS: 4.0000 mg | ORAL_TABLET | Freq: Four times a day (QID) | ORAL | Status: DC | PRN

## 2019-12-20 MED ORDER — FENTANYL CITRATE (PF) 100 MCG/2ML IJ SOLN: 100.0000 ug | Freq: Once | INTRAMUSCULAR | Status: AC

## 2019-12-20 MED ORDER — SODIUM CHLORIDE 0.9 % IR SOLN
Status: DC | PRN
  Administered 2019-12-20: 1000 mL

## 2019-12-20 MED ORDER — CEFAZOLIN SODIUM-DEXTROSE 2-4 GM/100ML-% IV SOLN
2.0000 g | INTRAVENOUS | Status: AC
  Administered 2019-12-20: 2 g via INTRAVENOUS
  Filled 2019-12-20: qty 100

## 2019-12-20 MED ORDER — PROPOFOL 500 MG/50ML IV EMUL
INTRAVENOUS | Status: DC | PRN
  Administered 2019-12-20: 150 ug/kg/min via INTRAVENOUS

## 2019-12-20 MED ORDER — ALBUMIN HUMAN 5 % IV SOLN: 12.5000 g | Freq: Once | INTRAVENOUS | Status: AC

## 2019-12-20 MED ORDER — CHLORHEXIDINE GLUCONATE 0.12 % MT SOLN
15.0000 mL | OROMUCOSAL | Status: AC
  Filled 2019-12-20: qty 15

## 2019-12-20 MED ORDER — LACTATED RINGERS IV SOLN: INTRAVENOUS | Status: DC

## 2019-12-20 MED ORDER — ALBUMIN HUMAN 5 % IV SOLN
INTRAVENOUS | Status: AC
  Administered 2019-12-20: 12.5 g via INTRAVENOUS
  Filled 2019-12-20: qty 250

## 2019-12-20 MED ORDER — PHENYLEPHRINE HCL-NACL 10-0.9 MG/250ML-% IV SOLN
25.0000 ug/min | INTRAVENOUS | Status: DC
  Administered 2019-12-20: 50 ug/min via INTRAVENOUS
  Filled 2019-12-20 (×3): qty 250

## 2019-12-20 SURGICAL SUPPLY — 39 items
BIT DRILL CALIBRATED 4.3X320MM (BIT) IMPLANT
BIT DRILL CANN 7X200 (BIT) ×2 IMPLANT
BLADE AVERAGE 25MMX9MM (BLADE) ×1
BLADE AVERAGE 25X9 (BLADE) ×2 IMPLANT
BLADE SAW SGTL MED 73X18.5 STR (BLADE) ×3 IMPLANT
BLADE SURG 10 STRL SS (BLADE) ×2 IMPLANT
BNDG COHESIVE 6X5 TAN NS LF (GAUZE/BANDAGES/DRESSINGS) ×2 IMPLANT
COVER SURGICAL LIGHT HANDLE (MISCELLANEOUS) ×4 IMPLANT
DRAPE OEC MINIVIEW 54X84 (DRAPES) ×2 IMPLANT
DRAPE U-SHAPE 47X51 STRL (DRAPES) ×3 IMPLANT
DRILL CALIBRATED 4.3X320MM (BIT) ×3
DURAPREP 26ML APPLICATOR (WOUND CARE) ×3 IMPLANT
ELECT REM PT RETURN 9FT ADLT (ELECTROSURGICAL) ×3
ELECTRODE REM PT RTRN 9FT ADLT (ELECTROSURGICAL) ×1 IMPLANT
GLOVE BIOGEL PI IND STRL 9 (GLOVE) ×1 IMPLANT
GLOVE BIOGEL PI INDICATOR 9 (GLOVE) ×2
GLOVE SURG ORTHO 9.0 STRL STRW (GLOVE) ×3 IMPLANT
GOWN STRL REUS W/ TWL LRG LVL3 (GOWN DISPOSABLE) ×1 IMPLANT
GOWN STRL REUS W/ TWL XL LVL3 (GOWN DISPOSABLE) ×1 IMPLANT
GOWN STRL REUS W/TWL LRG LVL3 (GOWN DISPOSABLE) ×3
GOWN STRL REUS W/TWL XL LVL3 (GOWN DISPOSABLE) ×3
GUIDEPIN 3.2X17.5 THRD DISP (PIN) ×2 IMPLANT
GUIDEWIRE 2.6X80 BEAD TIP (WIRE) IMPLANT
GUIDWIRE 2.6X80 BEAD TIP (WIRE) ×3
KIT BASIN OR (CUSTOM PROCEDURE TRAY) ×3 IMPLANT
KIT TURNOVER KIT B (KITS) ×3 IMPLANT
NAIL LOCK ANKLE ANTE 11X180 (Nail) ×2 IMPLANT
NS IRRIG 1000ML POUR BTL (IV SOLUTION) ×5 IMPLANT
PACK ORTHO EXTREMITY (CUSTOM PROCEDURE TRAY) ×3 IMPLANT
SCREW CORT TI DBL LEAD 5X26 (Screw) ×2 IMPLANT
SCREW CORT TI DBL LEAD 5X30 (Screw) ×4 IMPLANT
SCREW CORT TI DBL LEAD 5X65 (Screw) ×2 IMPLANT
SCREW CORT TI DBLE LEAD 5X28 (Screw) ×2 IMPLANT
SPONGE LAP 18X18 RF (DISPOSABLE) ×3 IMPLANT
SUT ETHILON 2 0 PSLX (SUTURE) ×7 IMPLANT
TOWEL GREEN STERILE (TOWEL DISPOSABLE) ×3 IMPLANT
TOWEL GREEN STERILE FF (TOWEL DISPOSABLE) ×3 IMPLANT
TUBE CONNECTING 12'X1/4 (SUCTIONS) ×1
TUBE CONNECTING 12X1/4 (SUCTIONS) ×2 IMPLANT

## 2019-12-20 NOTE — OR Nursing (Addendum)
Pt blood pressure prior to going to floor declined to 80/60's, dr. Gifford Shave called , at bedside, pt given push neo by anesthesia.  Will hang another bolus of albumin see orders. Pt in NAD HR within Normal limits

## 2019-12-20 NOTE — Anesthesia Preprocedure Evaluation (Addendum)
Anesthesia Evaluation  Patient identified by MRN, date of birth, ID band Patient awake    Reviewed: Allergy & Precautions, NPO status , Patient's Chart, lab work & pertinent test results  History of Anesthesia Complications Negative for: history of anesthetic complications  Airway Mallampati: I  TM Distance: >3 FB Neck ROM: Full    Dental  (+) Edentulous Upper, Edentulous Lower, Dental Advisory Given   Pulmonary neg COPD, Current Smoker and Patient abstained from smoking.,  Covid-19 Nucleic Acid Test Results Lab Results      Component                Value               Date                      SARSCOV2NAA              NEGATIVE            12/13/2019                [place]              NEGATIVE            12/13/2019                [place]              NEGATIVE            11/30/2019                [place]              NEGATIVE            11/22/2019                [place]              NEGATIVE            11/19/2019              breath sounds clear to auscultation       Cardiovascular hypertension,  Rhythm:Regular     Neuro/Psych PSYCHIATRIC DISORDERS Anxiety Depression negative neurological ROS     GI/Hepatic negative GI ROS, Neg liver ROS,   Endo/Other  negative endocrine ROS  Renal/GU negative Renal ROSLab Results      Component                Value               Date                      CREATININE               0.94                12/21/2019                Musculoskeletal  (+) Arthritis , Dehiscence Right Ankle Fracture   Abdominal   Peds  Hematology  (+) Blood dyscrasia, anemia , Lab Results      Component                Value               Date                      WBC  24.6 (H)            12/21/2019                HGB                      6.6 (LL)            12/21/2019                HCT                      19.3 (L)            12/21/2019                MCV                       99.0                12/21/2019                PLT                      351                 12/21/2019              Anesthesia Other Findings   Reproductive/Obstetrics                            Anesthesia Physical Anesthesia Plan  ASA: III  Anesthesia Plan: MAC and Regional   Post-op Pain Management:    Induction: Intravenous  PONV Risk Score and Plan: 0 and Treatment may vary due to age or medical condition and Propofol infusion  Airway Management Planned: Nasal Cannula  Additional Equipment: None  Intra-op Plan:   Post-operative Plan:   Informed Consent: I have reviewed the patients History and Physical, chart, labs and discussed the procedure including the risks, benefits and alternatives for the proposed anesthesia with the patient or authorized representative who has indicated his/her understanding and acceptance.     Dental advisory given  Plan Discussed with: CRNA and Surgeon  Anesthesia Plan Comments:         Anesthesia Quick Evaluation

## 2019-12-20 NOTE — Progress Notes (Signed)
Orthopedic Tech Progress Note Patient Details:  [name] 1945/07/14 [id number] Went to apply CAM WALKER to patient. But something was going on with patient while MDS was at bedside. Asked TECH to call once patient was ok for [place] to apply CAM WALKER. Boot is still with patient Patient ID: Lee Wang, male   DOB: [birth year], 74 y.o.   MRN: [id number]   Janit Pagan 12/20/2019, 6:32 PM

## 2019-12-20 NOTE — OR Nursing (Addendum)
Dr.woods notified attending has to get with cctm for admission.

## 2019-12-20 NOTE — OR Nursing (Signed)
Blood pressures at this time are 91/80 on left leg.  Will cont to monitor, neo infusing from OR at this time at 31mcq/min

## 2019-12-20 NOTE — Progress Notes (Signed)
Report given to short stay RN, patient transferred to procedure area.

## 2019-12-20 NOTE — Progress Notes (Signed)
eLink Physician-Brief Progress Note Patient Name: RAYMOUND KATICH DOB: [birth year] MRN: [id number]   Date of Service  12/20/2019  HPI/Events of Note  Patient with peri-operative hypotension following an orthopedic procedure, for which he was treated with volume resuscitation and Phenylephrine, he was admitted to the ICU for close monitoring.  eICU Interventions  New Patient Evaluation completed.        Kerry Kass Dayyan Krist 12/20/2019, 9:35 PM

## 2019-12-20 NOTE — OR Nursing (Addendum)
Spoke with dr. Sherral Hammers, nurse to call cctm for admission to ICU.

## 2019-12-20 NOTE — Consult Note (Signed)
NAME:  Lee Wang, MRN:  [id number], DOB:  Feb 02, 1946, LOS: 7 ADMISSION DATE:  12/13/2019, CONSULTATION DATE:  12/20/19 REFERRING MD:  Sherral Hammers  CHIEF COMPLAINT:  Hypotension post op   Brief History   Lee Wang is a 74 y.o. male who was initially admitted to [facility] 10/11 after he tripped and fell fell and sustained a right ankle fx.  He had closed reduction and splinting then followed by ORIF 10/21.  Unfortunately he did not have appropriate healing; therefore, 11/7 he was transferred to [facility]. 11/10 he was taken back to OR for tibiocalcaneal fusion.  History of present illness   Lee Wang is a 74 y.o. male who has a PMH including but not limited to HTN, HLD, depression, anxiety, EtOH use (see "past medical history" for rest).  He initially presented to [facility] 10/11 after he fell and sustained a right ankle fx (fx dislocation with failure of the previous hardware in the fibula, fx of base of 5th metatarsal).  He had closed reduction and splinting of bimalleolar fx (Dr. Amedeo [name]).  He was later discharged 10/13 with plans for outpatient ortho follow up for definitive management.  He was then re-admitted 10/21 for ORIF of ankle and fibular with removal of old hardware.  Unfortunately, he had failure and required return to OR 11/4 for removal of hardware and I&D.  11/7, he was transferred from [facility] to [facility] for further evaluation by ortho (Dr. Sharol Given) and for limb salvage intervention.  11/10, he had return to OR for right tibiocalcaneal fusion.  He was extubated post operatively; however, he had ongoing hypotension that did not respond to fluids, albumin, or neo pushes.  He was subsequently started on neo gtt and PCCM was called for ICU transfer and management / weaning of pressors.  Past Medical History  has Alcohol abuse; HEMATOCHEZIA; DIARRHEA; ABDOMINAL PAIN, UNSPECIFIED SITE; HTN (hypertension); Hyperlipemia; Depression with anxiety; Prostate CA ([place]); BPH (benign prostatic hyperplasia); Elevated  LFTs; Abnormal [place] (ultrasound) of abdomen; Hepatomegaly; History of colonic polyps; GAD (generalized anxiety disorder); Closed fracture dislocation of joint of left shoulder girdle; Greater tuberosity of humerus fracture; Avulsion fracture of metatarsal bone of right foot with delayed healing; Left rib fracture; Hyponatremia; Alcohol dependence ([place]); Closed right ankle fracture; Tobacco abuse; Closed trimalleolar fracture of right ankle; Closed right ankle fracture, sequela; Exposed orthopaedic hardware [facility]); and Rupture of operation wound on their problem list.  [place] [facility] Events   10/11 > initial admit to [facility]. 10/13 > discharged from [facility]. 10/21 > ORIF and removal of old hardware. 11/4 > removal of hardware and I&D. 11/7 > transfer to [facility]. 11/10 > OR for tibiocalcaneal fusion.  Consults:  Ortho, PCCM.  Procedures:  None.  Significant Diagnostic Tests:  None.  Micro Data:  Flu 11/3 > neg. COVID 11/3 > neg. RSV 11/3 > positive.  Antimicrobials:  Ancef peri-op   Interim history/subjective:  Awake.    Objective:  Blood pressure (!) 86/56, pulse 66, temperature 98.2 F (36.8 C), resp. rate 16, height 5\' 7"  (1.702 m), weight 64.3 kg, SpO2 94 %.        Intake/Output Summary (Last 24 hours) at 12/20/2019 1719 Last data filed at 12/20/2019 1433 Gross per 24 hour  Intake 1250 ml  Output 850 ml  Net 400 ml   Filed Weights   12/13/19 1302 12/17/19 1248 12/20/19 1106  Weight: 56.2 kg 64.3 kg 64.3 kg    Examination: General: Adult male, decreased muscle tone throughout, in NAD. Neuro:  Awake, slightly confused.  MAE's.  HEENT: [place]/AT. Sclerae pale and anicteric.  EOMI.  MM dry. Cardiovascular: RRR, no M/R/G.  Lungs: Respirations even and unlabored.  CTA bilaterally, No W/R/R.  Abdomen: BS x 4, soft, NT/ND.  Musculoskeletal: R leg dressing and VAC in place.  No other gross deformities, no edema.  Skin: Spider hemangiomas to chest.  Otherwise intact, warm, no  rashes.  Assessment & Plan:   Hypotension, post-operatively - ddx include ABLA (EBL reportedly only 53ml) vs post anesthesia/ sedation vs sepsis (less likely).  S/p albumin in PACU. - ICU monitoring. - Continue peripheral Neo for MAP goal  > 65. - Pending CBC, plans to transfuse for Hgb < 7. - Send BC empirically and check PCT. - Monitor clinically for now, trending WBC/ fever curve which have been normal and he has remained afebrile.  Hyperosmolar hyponatremia, symptomatic secondary to excessive ETOH and poor PO intake.  - Trending neuro exams. - Trend Na.  - Continue salt tablets.  - Continue thiamine/ folate. - Delirium precautions.   Right ankle fracture with failed hardware and dehiscence - s/p OR 11/10 for right tibiocalcaneal fusion with wound vac placement by Dr. Sharol Given.  Hx previous closed reduction on 10/11 and ORIF 10/21. - Per Ortho.  Acute on chronic IDA and ABLA (Hgb pre op 8.2 and intraop EBL 46ml). - Transfuse 1u PRBC now then follow post transfusion labs. - Continue iron supplementation.  Incidentally RSV positive. - Droplet precautions.   Tobacco dependence - reportedly quit 2 months ago. - Continue cessation.  EtOH dependence. - Thiamine, folate. - CIWA if needed.  Labs   CBC: Recent Labs  Lab 12/15/19 0938 12/17/19 0555 12/18/19 0230 12/20/19 0307 12/20/19 0855  WBC 8.0 6.6 6.0 6.9 6.1  NEUTROABS  --   --   --   --  4.0  HGB 8.3* 7.9* 7.5* 8.3* 8.2*  HCT 24.7* 22.8* 22.2* 24.5* 24.1*  MCV 98.8 95.4 94.1 95.7 96.0  PLT 302 283 264 310 829   Basic Metabolic Panel: Recent Labs  Lab 12/19/19 1248 12/19/19 1657 12/19/19 2157 12/20/19 0307 12/20/19 0855  NA 125* 124* 127* 127* 126*  K 4.1 4.0 4.2 3.9 4.1  CL 94* 94* 95* 96* 96*  CO2 24 25 22 24 25   GLUCOSE 83 137* 99 90 88  BUN 5* <5* 6* <5* <5*  CREATININE 0.53* 0.57* 0.58* 0.54* 0.52*  CALCIUM 8.7* 8.5* 8.7* 8.7* 8.7*  MG  --   --   --   --  1.7  PHOS  --   --   --   --  3.4    GFR: Estimated Creatinine Clearance: 73.7 mL/min (A) (by C-G formula based on SCr of 0.52 mg/dL (L)). Recent Labs  Lab 12/17/19 0555 12/18/19 0230 12/20/19 0307 12/20/19 0855  WBC 6.6 6.0 6.9 6.1   Liver Function Tests: Recent Labs  Lab 12/20/19 0855  AST 31  ALT 20  ALKPHOS 132*  BILITOT 1.1  PROT 5.4*  ALBUMIN 2.5*   No results for input(s): LIPASE, AMYLASE in the last 168 hours. No results for input(s): AMMONIA in the last 168 hours. ABG No results found for: PHART, PCO2ART, PO2ART, HCO3, TCO2, ACIDBASEDEF, O2SAT  Coagulation Profile: No results for input(s): INR, PROTIME in the last 168 hours. Cardiac Enzymes: No results for input(s): CKTOTAL, CKMB, CKMBINDEX, TROPONINI in the last 168 hours. HbA1C: No results found for: HGBA1C CBG: No results for input(s): GLUCAP in the last 168 hours.  Review of  Systems:   All negative; except for those that are bolded, which indicate positives.  Constitutional: weight loss, weight gain, night sweats, fevers, chills, fatigue, weakness.  HEENT: headaches, sore throat, sneezing, nasal congestion, post nasal drip, difficulty swallowing, tooth/dental problems, visual complaints, visual changes, ear aches. Neuro: difficulty with speech, weakness, numbness, ataxia. CV:  chest pain, orthopnea, PND, swelling in lower extremities, dizziness, palpitations, syncope.  Resp: cough, hemoptysis, dyspnea, wheezing. GI: heartburn, indigestion, abdominal pain, nausea, vomiting, diarrhea, constipation, change in bowel habits, loss of appetite, hematemesis, melena, hematochezia.  GU: dysuria, change in color of urine, urgency or frequency, flank pain, hematuria. MSK: joint pain or swelling, decreased range of motion, R ankle pain. Psych: change in mood or affect, depression, anxiety, suicidal ideations, homicidal ideations. Skin: rash, itching, bruising.   Past medical history  He,  has a past medical history of Anxiety, Arthritis, Cancer  ([place]) (prostate), Depression, Heavy drinker of alcohol, Hyperlipidemia, and Hypertension.   Surgical History    Past Surgical History:  Procedure Laterality Date  . ANKLE CLOSED REDUCTION Right 11/20/2019   Procedure: CLOSED REDUCTION ANKLE;  Surgeon: Mordecai Rasmussen, MD;  Location: [facility];  Service: Orthopedics;  Laterality: Right;  . CATARACT EXTRACTION W/PHACO Right 08/21/2013   Procedure: CATARACT EXTRACTION PHACO AND INTRAOCULAR LENS PLACEMENT (IOC);  Surgeon: Tonny Branch, MD;  Location: [facility];  Service: Ophthalmology;  Laterality: Right;  CDE 7.34  . CATARACT EXTRACTION W/PHACO Left 09/14/2013   Procedure: CATARACT EXTRACTION PHACO AND INTRAOCULAR LENS PLACEMENT (IOC);  Surgeon: Tonny Branch, MD;  Location: [facility];  Service: Ophthalmology;  Laterality: Left;  CDE: 11.50  . COLONOSCOPY  2011   Recc repeat 3 years external / anal canal hemorrhoids, otherwise normal rectum. 2. multiple colonic polyps with largest ulcerated pedunculated polyp in the midsigmoid, status post multiple  hot snare polypectomies. no evidence of colitis or diverticuliosis.   Marland Kitchen EXTERNAL FIXATION LEG Right 12/14/2019   Procedure: ATTEMPTED EXTERNAL FIXATION RIGHT ANKLE;  Surgeon: Mordecai Rasmussen, MD;  Location: [facility];  Service: Orthopedics;  Laterality: Right;  . FRACTURE SURGERY Bilateral    ankle  . HARDWARE REMOVAL Right 11/30/2019   Procedure: HARDWARE REMOVAL; right fibula plate and screws;  Surgeon: Mordecai Rasmussen, MD;  Location: [facility];  Service: Orthopedics;  Laterality: Right;  . HARDWARE REMOVAL Right 12/14/2019   Procedure: HARDWARE REMOVAL OF RIGHT ANKLE;  Surgeon: Mordecai Rasmussen, MD;  Location: [facility];  Service: Orthopedics;  Laterality: Right;  . IRRIGATION AND DEBRIDEMENT FOOT Right 12/14/2019   Procedure: IRRIGATION AND DEBRIDEMENT RIGHT ANKLE;  Surgeon: Mordecai Rasmussen, MD;  Location: [facility];  Service: Orthopedics;  Laterality: Right;  . ORIF ANKLE FRACTURE Right 11/30/2019   Procedure: Operative Fixation of  Right Ankle Fracture;  Surgeon: Mordecai Rasmussen, MD;  Location: [facility];  Service: Orthopedics;  Laterality: Right;  ORIF medial mal; revision ORIF fibula  . ORIF HUMERUS FRACTURE Left 10/14/2016   Procedure: OPEN REDUCTION INTERNAL FIXATION (ORIF) LEFT SHOULDER FRACTURE/DISLOCATION, OPEN REDUCTION INTERNAL FIXATION GREATER TUBEROSITY;  Surgeon: Marybelle Killings, MD;  Location: [place];  Service: Orthopedics;  Laterality: Left;  . PROSTATE SURGERY     radiation implants     Social History   reports that he has been smoking cigarettes. He started smoking about 61 years ago. He has a 50.00 pack-year smoking history. His smokeless tobacco use includes chew. He reports current alcohol use of about 10.0 standard drinks of alcohol per week. He reports that he does  not use drugs.   Family history   His family history includes Alzheimer's disease in his mother and paternal grandfather; Heart disease in his father. There is no history of Colon cancer or Liver disease.   Allergies No Known Allergies   Home meds  Prior to Admission medications   Medication Sig Start Date End Date Taking? Authorizing Provider  ALPRAZolam Duanne Moron) 0.5 MG tablet Take 1 tablet (0.5 mg total) by mouth 2 (two) times daily as needed. for anxiety 11/22/19  Yes Kathie Dike, MD  aspirin EC 81 MG tablet Take 1 tablet (81 mg total) by mouth in the morning and at bedtime. 11/30/19 01/11/20 Yes Mordecai Rasmussen, MD  atorvastatin (LIPITOR) 40 MG tablet Take 1 tablet (40 mg total) by mouth daily. 03/08/15  Yes Claretta Fraise, MD  docusate sodium (COLACE) 100 MG capsule Take 1 capsule (100 mg total) by mouth daily as needed for moderate constipation (While taking narcotic; hold for loose stools). 11/30/19 12/30/19 Yes Mordecai Rasmussen, MD  escitalopram (LEXAPRO) 10 MG tablet Take 1 tablet (10 mg total) by mouth daily. 09/19/15  Yes Dettinger, Fransisca Kaufmann, MD  propranolol (INDERAL) 20 MG tablet Take 0.5 tablets (10 mg total) by mouth 3 (three) times  daily. 11/22/19  Yes Kathie Dike, MD    Critical care time: 35 min.    Montey Hora, [facility] 12/20/2019, 6:03 PM

## 2019-12-20 NOTE — OR Nursing (Addendum)
Page to dr. Sherral Hammers pt blood pressure with NEO remains low, awaiting response Pt presently sitting drinking coffee HR/spo2 WNL, no pain noted.

## 2019-12-20 NOTE — Progress Notes (Signed)
PROGRESS NOTE    Lee Wang  GBT:[id number] DOB: 01-Aug-1945 DOA: 12/13/2019 PCP: Patient, No Pcp Per     Brief Narrative:  74 year old WM PMHx Etoh abuse, BPH, prostate cancer , tobacco abuse, HLD and HTN   who had a witnessed mechanical fall after missing a step on 11/19/2019 subsequently imaging studies demonstrated fracture dislocation of the ankle and acute fracture (avulsion) through the base of the fifth metatarsal, he underwent Closed reduction and splinting of bimalleolar right ankle fractureon 11/20/19, on 11/30/2019 patient underwent-Removal of pre-existing plate and screws from the fibula with revision ORIF of the fibula with a locking plate. ORIF of the vertical shear fracture of the medial malleolus -On 12/14/2019 patient was found to have failure of hardware with essentially conversion of his close fractures and open fracture, on 12/14/19 patient underwent Removal of hardware from right ankle,irrigation and debridement,unsuccessful attempt at placement of external fixation and splint application Transferred to [place] from [facility] for definitive care .  Currently plan is treat hyponatremia.  Right tibiocalcaneal fusion tomorrow.   Subjective: Afebrile overnight A/O x1 (does not know where, when, why), cooperative pleasant.   Assessment & Plan: Covid vaccination;   Principal Problem:   Closed right ankle fracture, sequela Active Problems:   Alcohol abuse   HTN (hypertension)   Depression with anxiety   Prostate CA (HCC)   BPH (benign prostatic hyperplasia)   Hyponatremia   Alcohol dependence (HCC)   Closed right ankle fracture   Tobacco abuse   Rupture of operation wound   Acute metabolic encephalopathy/ Hypoosmolar hyponatremia.   -Multifactorial EtOH abuse, poor p.o. intake. -Normal saline 50ml/hr -Monitor closely for further mental status changes  -Monitor closely sodium level -Hold all medication which will lower sodium  level. -Lexapro (hold) -NaCl tablets 2 g TID  EtOH abuse -1 mg daily -Thiamine 100 mg daily -Seizure precaution  RIGHT ankle fracture with failed hardware. Presented with an open wound with failed hardware on 12/14/2019. Underwent removal of the hardware, irrigation and debridement as well as unsuccessful attempt at placement of external fixation on 12/14/2019. Patient was on IV antibiotics. Patient is currently transferred to [facility] for ankle fusion by Dr. Sharol Given tentatively scheduled for Wednesday, 12/20/2019 -.11/10  RIGHT TIBIOCALCANEAL FUSION  Hypotension/Essential HTN -HTN currently not a factor -Per PACU staff patient receiving Phenyl ephedrine during surgery and unable to titrate patient off without dropping BP. -11/10 Albumin 12.5 g + normal saline -Continue Phenyl ephedrine, continue to attempt to wean patient off in the ICU.  Currently at 80 mcg/min -Map goal> 65  Acute blood loss anemia/acute on chronic iron deficiency anemia. -Patient has had multiple surgeries in the past 30 days secondary to fractured ankle  -11/10 transfuse 1 unit PRBC -Transfuse for hemoglobin<7  Tobacco abuse. -Holding off on nicotine patch due to confusion.  Monitor.  Depression and anxiety. No suicidal ideation. No significant anxiety as well. Lexapro on hold.  Incidentally RSV positive. Continue droplet precaution.  No respiratory symptoms for now.   DVT prophylaxis: Hold until 11/11 just completed long surgery Code Status: Full Family Communication:  Status is: Inpatient    Dispo: The patient is from: Home              Anticipated d/c is to: SNF              Anticipated d/c date is:??              Patient currently unstable  Consultants:  Orthopedic surgery Dr. Sharol Given   Procedures/Significant Events:  11/10  RIGHT TIBIOCALCANEAL FUSION    I have personally reviewed and interpreted all radiology studies and my findings are as above.  VENTILATOR  SETTINGS: Nasal cannula 11/10 Flow 2 L/min SPO2 100%   Cultures 11/3 SARS coronavirus negative 11/3 influenza A/B negative 11/3 positive RSV   Antimicrobials:    Devices    LINES / TUBES:      Continuous Infusions: .  ceFAZolin (ANCEF) IV       Objective: Vitals:   12/19/19 0854 12/19/19 1512 12/19/19 2000 12/20/19 0500  BP: 130/87 107/61 109/74 110/76  Pulse: 70 63 62 67  Resp: 16 20 18 18   Temp: 98.3 F (36.8 C) 99.5 F (37.5 C) 98.6 F (37 C) 98.6 F (37 C)  TempSrc: Oral Oral Oral Oral  SpO2: 99% 98% 98% 98%  Weight:      Height:        Intake/Output Summary (Last 24 hours) at 12/20/2019 0757 Last data filed at 12/20/2019 0000 Gross per 24 hour  Intake 680 ml  Output --  Net 680 ml   Filed Weights   12/13/19 1302 12/17/19 1248  Weight: 56.2 kg 64.3 kg    Examination:  General: A/O x1 (does not know where, when, why) no acute respiratory distress, cachectic Eyes: negative scleral hemorrhage, negative anisocoria, negative icterus ENT: Negative Runny nose, negative gingival bleeding, Neck:  Negative scars, masses, torticollis, lymphadenopathy, JVD Lungs: Clear to auscultation bilaterally without wheezes or crackles Cardiovascular: Regular rate and rhythm without murmur gallop or rub normal S1 and S2 Abdomen: negative abdominal pain, nondistended, positive soft, bowel sounds, no rebound, no ascites, no appreciable mass Extremities: No significant cyanosis, clubbing, or edema bilateral lower extremities Skin: Negative rashes, lesions, ulcers Psychiatric:  Negative depression, negative anxiety, negative fatigue, negative mania  Central nervous system:  Cranial nerves II through XII intact, tongue/uvula midline, all extremities muscle strength 5/5, sensation intact throughout, negative dysarthria, negative expressive aphasia, negative receptive aphasia.  .     Data Reviewed: Care during the described time interval was provided by me .  I have  reviewed this patient's available data, including medical history, events of note, physical examination, and all test results as part of my evaluation.  CBC: Recent Labs  Lab 12/13/19 1341 12/15/19 0938 12/17/19 0555 12/18/19 0230 12/20/19 0307  WBC 10.3 8.0 6.6 6.0 6.9  HGB 11.2* 8.3* 7.9* 7.5* 8.3*  HCT 33.4* 24.7* 22.8* 22.2* 24.5*  MCV 96.8 98.8 95.4 94.1 95.7  PLT 323 302 283 264 096   Basic Metabolic Panel: Recent Labs  Lab 12/19/19 0817 12/19/19 1248 12/19/19 1657 12/19/19 2157 12/20/19 0307  NA 125* 125* 124* 127* 127*  K 4.0 4.1 4.0 4.2 3.9  CL 95* 94* 94* 95* 96*  CO2 23 24 25 22 24   GLUCOSE 99 83 137* 99 90  BUN <5* 5* <5* 6* <5*  CREATININE 0.47* 0.53* 0.57* 0.58* 0.54*  CALCIUM 8.7* 8.7* 8.5* 8.7* 8.7*   GFR: Estimated Creatinine Clearance: 73.7 mL/min (A) (by C-G formula based on SCr of 0.54 mg/dL (L)). Liver Function Tests: No results for input(s): AST, ALT, ALKPHOS, BILITOT, PROT, ALBUMIN in the last 168 hours. No results for input(s): LIPASE, AMYLASE in the last 168 hours. No results for input(s): AMMONIA in the last 168 hours. Coagulation Profile: No results for input(s): INR, PROTIME in the last 168 hours. Cardiac Enzymes: No results for input(s): CKTOTAL, CKMB, CKMBINDEX, TROPONINI in the  last 168 hours. BNP (last 3 results) No results for input(s): PROBNP in the last 8760 hours. HbA1C: No results for input(s): HGBA1C in the last 72 hours. CBG: No results for input(s): GLUCAP in the last 168 hours. Lipid Profile: No results for input(s): CHOL, HDL, LDLCALC, TRIG, CHOLHDL, LDLDIRECT in the last 72 hours. Thyroid Function Tests: No results for input(s): TSH, T4TOTAL, FREET4, T3FREE, THYROIDAB in the last 72 hours. Anemia Panel: No results for input(s): VITAMINB12, FOLATE, FERRITIN, TIBC, IRON, RETICCTPCT in the last 72 hours. Sepsis Labs: No results for input(s): PROCALCITON, LATICACIDVEN in the last 168 hours.  Recent Results (from the past  240 hour(s))  Respiratory Panel by RT PCR (Flu A&B, Covid) - Nasopharyngeal Swab     Status: None   Collection Time: 12/13/19  1:15 PM   Specimen: Nasopharyngeal Swab  Result Value Ref Range Status   SARS Coronavirus 2 by RT PCR NEGATIVE NEGATIVE Final    Comment: (NOTE) SARS-CoV-2 target nucleic acids are NOT DETECTED.  The SARS-CoV-2 RNA is generally detectable in upper respiratoy specimens during the acute phase of infection. The lowest concentration of SARS-CoV-2 viral copies this assay can detect is 131 copies/mL. A negative result does not preclude SARS-Cov-2 infection and should not be used as the sole basis for treatment or other patient management decisions. A negative result may occur with  improper specimen collection/handling, submission of specimen other than nasopharyngeal swab, presence of viral mutation(s) within the areas targeted by this assay, and inadequate number of viral copies (<131 copies/mL). A negative result must be combined with clinical observations, patient history, and epidemiological information. The expected result is Negative.  Fact Sheet for Patients:  PinkCheek.be  Fact Sheet for Healthcare Providers:  GravelBags.it  This test is no t yet approved or cleared by the [place] FDA and  has been authorized for detection and/or diagnosis of SARS-CoV-2 by FDA under an Emergency Use Authorization (EUA). This EUA will remain  in effect (meaning this test can be used) for the duration of the COVID-19 declaration under Section 564(b)(1) of the Act, 21 U.S.C. section 360bbb-3(b)(1), unless the authorization is terminated or revoked sooner.     Influenza A by PCR NEGATIVE NEGATIVE Final   Influenza B by PCR NEGATIVE NEGATIVE Final    Comment: (NOTE) The Xpert Xpress SARS-CoV-2/FLU/RSV assay is intended as an aid in  the diagnosis of influenza from Nasopharyngeal swab specimens and  should  not be used as a sole basis for treatment. Nasal washings and  aspirates are unacceptable for Xpert Xpress SARS-CoV-2/FLU/RSV  testing.  Fact Sheet for Patients: PinkCheek.be  Fact Sheet for Healthcare Providers: GravelBags.it  This test is not yet approved or cleared by the [place] FDA and  has been authorized for detection and/or diagnosis of SARS-CoV-2 by  FDA under an Emergency Use Authorization (EUA). This EUA will remain  in effect (meaning this test can be used) for the duration of the  Covid-19 declaration under Section 564(b)(1) of the Act, 21  U.S.C. section 360bbb-3(b)(1), unless the authorization is  terminated or revoked. Performed at [facility], [address] [place], [place] [address]   Resp Panel by RT PCR (RSV, Flu A&B, Covid) -     Status: Abnormal   Collection Time: 12/13/19  1:15 PM  Result Value Ref Range Status   SARS Coronavirus 2 by RT PCR NEGATIVE NEGATIVE Final    Comment: (NOTE) SARS-CoV-2 target nucleic acids are NOT DETECTED.  The SARS-CoV-2 RNA is generally detectable  in upper respiratoy specimens during the acute phase of infection. The lowest concentration of SARS-CoV-2 viral copies this assay can detect is 131 copies/mL. A negative result does not preclude SARS-Cov-2 infection and should not be used as the sole basis for treatment or other patient management decisions. A negative result may occur with  improper specimen collection/handling, submission of specimen other than nasopharyngeal swab, presence of viral mutation(s) within the areas targeted by this assay, and inadequate number of viral copies (<131 copies/mL). A negative result must be combined with clinical observations, patient history, and epidemiological information. The expected result is Negative.  Fact Sheet for Patients:  PinkCheek.be  Fact Sheet for Healthcare Providers:   GravelBags.it  This test is no t yet approved or cleared by the [place] FDA and  has been authorized for detection and/or diagnosis of SARS-CoV-2 by FDA under an Emergency Use Authorization (EUA). This EUA will remain  in effect (meaning this test can be used) for the duration of the COVID-19 declaration under Section 564(b)(1) of the Act, 21 U.S.C. section 360bbb-3(b)(1), unless the authorization is terminated or revoked sooner.     Influenza A by PCR NEGATIVE NEGATIVE Final   Influenza B by PCR NEGATIVE NEGATIVE Final    Comment: (NOTE) The Xpert Xpress SARS-CoV-2/FLU/RSV assay is intended as an aid in  the diagnosis of influenza from Nasopharyngeal swab specimens and  should not be used as a sole basis for treatment. Nasal washings and  aspirates are unacceptable for Xpert Xpress SARS-CoV-2/FLU/RSV  testing.  Fact Sheet for Patients: PinkCheek.be  Fact Sheet for Healthcare Providers: GravelBags.it  This test is not yet approved or cleared by the [place] FDA and  has been authorized for detection and/or diagnosis of SARS-CoV-2 by  FDA under an Emergency Use Authorization (EUA). This EUA will remain  in effect (meaning this test can be used) for the duration of the  Covid-19 declaration under Section 564(b)(1) of the Act, 21  U.S.C. section 360bbb-3(b)(1), unless the authorization is  terminated or revoked.    Respiratory Syncytial Virus by PCR POSITIVE (A) NEGATIVE Final    Comment: (NOTE) Fact Sheet for Patients: PinkCheek.be  Fact Sheet for Healthcare Providers: GravelBags.it  This test is not yet approved or cleared by the [place] FDA and  has been authorized for detection and/or diagnosis of SARS-CoV-2 by  FDA under an Emergency Use Authorization (EUA). This EUA will remain  in effect (meaning this test can  be used) for the duration of the  COVID-19 declaration under Section 564(b)(1) of the Act, 21 U.S.C.  section 360bbb-3(b)(1), unless the authorization is terminated or  revoked. Performed at [facility], [address], [place], [place] [address]   Surgical pcr screen     Status: None   Collection Time: 12/13/19  3:58 PM   Specimen: Nasal Mucosa; Nasal Swab  Result Value Ref Range Status   MRSA, PCR NEGATIVE NEGATIVE Final   Staphylococcus aureus NEGATIVE NEGATIVE Final    Comment: (NOTE) The Xpert SA Assay (FDA approved for NASAL specimens in patients 30 years of age and older), is one component of a comprehensive surveillance program. It is not intended to diagnose infection nor to guide or monitor treatment. Performed at [facility], [address] [place], [place] [address]          Radiology Studies: No results found.      Scheduled Meds: . aspirin EC  81 mg Oral BID  . atorvastatin  40 mg Oral Daily  .  feeding supplement  237 mL Oral BID BM  . ferrous sulfate  325 mg Oral BID WC  . folic acid  1 mg Oral Daily  . multivitamin with minerals  1 tablet Oral Daily  . propranolol ER  60 mg Oral Daily  . sodium chloride  2 g Oral TID  . thiamine  100 mg Oral Daily   Or  . thiamine  100 mg Intravenous Daily  . vitamin B-12  1,000 mcg Oral Daily   Continuous Infusions: .  ceFAZolin (ANCEF) IV       LOS: 7 days    Time spent:40 min    Veleka Djordjevic, Geraldo Docker, MD [facility] Pager [phone]  If 7PM-7AM, please contact night-coverage [email or web address] Password [facility] 12/20/2019, 7:57 AM

## 2019-12-20 NOTE — Anesthesia Procedure Notes (Signed)
Anesthesia Regional Block: Adductor canal block   Pre-Anesthetic Checklist: ,, timeout performed, Correct Patient, Correct Site, Correct Laterality, Correct Procedure, Correct Position, site marked, Risks and benefits discussed,  Surgical consent,  Pre-op evaluation,  At surgeon's request and post-op pain management  Laterality: Right and Lower  Prep: chloraprep       Needles:  Injection technique: Single-shot     Needle Length: 9cm  Needle Gauge: 22     Additional Needles: Arrow StimuQuik ECHO Echogenic Stimulating PNB Needle  Procedures:,,,, ultrasound used (permanent image in chart),,,,  Narrative:  Start time: 12/20/2019 12:31 PM End time: 12/20/2019 12:35 PM Injection made incrementally with aspirations every 5 mL.  Performed by: Personally  Anesthesiologist: Oleta Mouse, MD

## 2019-12-20 NOTE — Plan of Care (Signed)

## 2019-12-20 NOTE — OR Nursing (Signed)
Blood bank notified nurse pt needs type and screen, phlebotomy called to draw lab

## 2019-12-20 NOTE — OR Nursing (Signed)
Pt is awake,alert and oriented to self and situation.Pt and/or family verbalized understanding of poc and discharge instructions. Reviewed admission and on going care with receiving RN. Pt is in NAD at this time and is ready to be transferred to floor. Will con't to monitor until pt is transferred. Belongings on bed with patient including cam boot Pt on 02 2 l/min [place] Pt on Monitor in SB to SR See Mar for neo infusion

## 2019-12-20 NOTE — Interval H&P Note (Signed)
History and Physical Interval Note:  12/20/2019 7:15 AM  Lee Wang  has presented today for surgery, with the diagnosis of Dehiscence Right Ankle Fracture.  The various methods of treatment have been discussed with the patient and family. After consideration of risks, benefits and other options for treatment, the patient has consented to  Procedure(s): RIGHT TIBIOCALCANEAL FUSION (Right) as a surgical intervention.  The patient's history has been reviewed, patient examined, no change in status, stable for surgery.  I have reviewed the patient's chart and labs.  Questions were answered to the patient's satisfaction.     [name]

## 2019-12-20 NOTE — OR Nursing (Signed)
IV started see charting, blood drawn, sent via tube system to lab.  EKG completed per Dr. Shearon Stalls, [place] noted.

## 2019-12-20 NOTE — Op Note (Signed)
12/20/2019  2:22 PM  PATIENT:  Mellissa Kohut    PRE-OPERATIVE DIAGNOSIS:  Dehiscence Right Ankle Fracture  POST-OPERATIVE DIAGNOSIS:  Same  PROCEDURE:  RIGHT TIBIOCALCANEAL FUSION  SURGEON:  [name], MD  PHYSICIAN ASSISTANT:None ANESTHESIA:   General  PREOPERATIVE INDICATIONS:  AMIL BOUWMAN is a  74 y.o. male with a diagnosis of Dehiscence Right Ankle Fracture who failed conservative measures and elected for surgical management.    The risks benefits and alternatives were discussed with the patient preoperatively including but not limited to the risks of infection, bleeding, nerve injury, cardiopulmonary complications, the need for revision surgery, among others, and the patient was willing to proceed.  OPERATIVE IMPLANTS: Paragon tibial talar calcaneal fusion nail  @ENCIMAGES @  OPERATIVE FINDINGS: Collapse of the distal tibia with soft bone.  C-arm fluoroscopy verified tibiotalar calcaneal fusion with medial translation of the talus  OPERATIVE PROCEDURE: Patient was brought the operating room after undergoing a regional block.  After adequate levels anesthesia were obtained patient's right lower extremity was first cleansed with ChloraPrep scrub dried prepped using DuraPrep draped into a sterile field a timeout was called.  Patient's previous medial and lateral incisions were used the medial malleolus was excised this was a nonviable loose piece of bone.  The distal tibia was resected and perpendicular cuts were made to the long axis of the tibia of the distal tibia and talus.  With the significant loss of tibia medially this could not be plain back to a flat surface this would require resection of about another centimeter of tibia and would make patient's leg much too short.  This was lined up with subsidence of the talus medially.  A guidewire was inserted from the calcaneus into the tibia C-arm fluoroscopy verified alignment and this was overreamed to 12 mm from the nail.   The nail was inserted and a screw was placed into the talus this was impacted and two proximal screws were placed into the tibia.  The compression screw was used to compress the calcaneus to the talus and a talar screw was placed followed by two additional oblique talar screws.  C-arm fluoroscopy verified alignment.  Patient's foot was plantigrade his second toe was in line with the patella.  The leg was shortened.  The wounds were irrigated with normal saline incision was closed using 2-0 nylon a Prevena arthroform wound VAC sponge was applied this was outlined with derma tack this had a good suction fit this was overwrapped with Covan patient was taken the PACU in stable condition   DISCHARGE PLANNING:  Antibiotic duration: 24 hours antibiotics  Weightbearing: Nonweightbearing on the right with a fracture boot  Pain medication: Opioid pathway  Dressing care/ Wound VAC: Continue wound VAC for 1 week  Ambulatory devices: Walker  Discharge to: Anticipate discharge to skilled nursing  Follow-up: In the office 1 week post operative.

## 2019-12-20 NOTE — Anesthesia Procedure Notes (Signed)
Procedure Name: MAC Date/Time: 12/20/2019 12:51 PM Performed by: Rande Brunt, CRNA Pre-anesthesia Checklist: Patient identified, Emergency Drugs available, Suction available and Patient being monitored Patient Re-evaluated:Patient Re-evaluated prior to induction Oxygen Delivery Method: Simple face mask Induction Type: IV induction Placement Confirmation: positive ETCO2 and breath sounds checked- equal and bilateral Dental Injury: Teeth and Oropharynx as per pre-operative assessment

## 2019-12-20 NOTE — Transfer of Care (Signed)
Immediate Anesthesia Transfer of Care Note  Patient: Lee Wang  Procedure(s) Performed: RIGHT TIBIOCALCANEAL FUSION (Right Ankle)  Patient Location: PACU  Anesthesia Type:MAC combined with regional for post-op pain  Level of Consciousness: awake, alert  and oriented  Airway & Oxygen Therapy: Patient Spontanous Breathing and Patient connected to face mask oxygen  Post-op Assessment: Report given to RN, Post -op Vital signs reviewed and stable and Patient moving all extremities X 4  Post vital signs: Reviewed and stable  Last Vitals:  Vitals Value Taken Time  BP 90/55 12/20/19 1431  Temp 36.8 C 12/20/19 1421  Pulse 72 12/20/19 1433  Resp 15 12/20/19 1433  SpO2 100 % 12/20/19 1433  Vitals shown include unvalidated device data.  Last Pain:  Vitals:   12/20/19 1421  TempSrc:   PainSc: 0-No pain      Patients Stated Pain Goal: 5 (46/80/32 1224)  Complications: No complications documented.

## 2019-12-20 NOTE — Anesthesia Procedure Notes (Signed)
Anesthesia Regional Block: Popliteal block   Pre-Anesthetic Checklist: ,, timeout performed, Correct Patient, Correct Site, Correct Laterality, Correct Procedure, Correct Position, site marked, Risks and benefits discussed,  Surgical consent,  Pre-op evaluation,  At surgeon's request and post-op pain management  Laterality: Right and Lower  Prep: chloraprep       Needles:  Injection technique: Single-shot     Needle Length: 9cm  Needle Gauge: 22     Additional Needles: Arrow StimuQuik ECHO Echogenic Stimulating PNB Needle  Procedures:,,,, ultrasound used (permanent image in chart),,,,  Narrative:  Start time: 12/20/2019 12:27 PM End time: 12/20/2019 12:31 PM Injection made incrementally with aspirations every 5 mL.  Performed by: Personally  Anesthesiologist: Oleta Mouse, MD

## 2019-12-20 NOTE — OR Nursing (Signed)
Pt in pacu, initial low blood pressure, CRNA at bedside pt received some neo and albumin administered by CRNA.  Will con't to monitor

## 2019-12-21 ENCOUNTER — Encounter (HOSPITAL_COMMUNITY): Payer: Self-pay | Admitting: Orthopedic Surgery

## 2019-12-21 ENCOUNTER — Inpatient Hospital Stay (HOSPITAL_COMMUNITY): Payer: Medicare Other

## 2019-12-21 DIAGNOSIS — F418 Other specified anxiety disorders: Secondary | ICD-10-CM | POA: Diagnosis not present

## 2019-12-21 DIAGNOSIS — I9589 Other hypotension: Secondary | ICD-10-CM

## 2019-12-21 DIAGNOSIS — F1020 Alcohol dependence, uncomplicated: Secondary | ICD-10-CM | POA: Diagnosis not present

## 2019-12-21 DIAGNOSIS — S82891G Other fracture of right lower leg, subsequent encounter for closed fracture with delayed healing: Secondary | ICD-10-CM | POA: Diagnosis not present

## 2019-12-21 DIAGNOSIS — F1010 Alcohol abuse, uncomplicated: Secondary | ICD-10-CM | POA: Diagnosis not present

## 2019-12-21 DIAGNOSIS — S82891S Other fracture of right lower leg, sequela: Secondary | ICD-10-CM | POA: Diagnosis not present

## 2019-12-21 LAB — COMPREHENSIVE METABOLIC PANEL
ALT: 27 U/L (ref 0–44)
AST: 47 U/L — ABNORMAL HIGH (ref 15–41)
Albumin: 2.7 g/dL — ABNORMAL LOW (ref 3.5–5.0)
Alkaline Phosphatase: 99 U/L (ref 38–126)
Anion gap: 11 (ref 5–15)
BUN: 12 mg/dL (ref 8–23)
CO2: 19 mmol/L — ABNORMAL LOW (ref 22–32)
Calcium: 8.4 mg/dL — ABNORMAL LOW (ref 8.9–10.3)
Chloride: 99 mmol/L (ref 98–111)
Creatinine, Ser: 0.94 mg/dL (ref 0.61–1.24)
GFR, Estimated: >60 mL/min (ref >60)
Glucose, Bld: 94 mg/dL (ref 70–99)
Potassium: 5.1 mmol/L (ref 3.5–5.1)
Sodium: 129 mmol/L — ABNORMAL LOW (ref 135–145)
Total Bilirubin: 1 mg/dL (ref 0.3–1.2)
Total Protein: 4.7 g/dL — ABNORMAL LOW (ref 6.5–8.1)

## 2019-12-21 LAB — PROCALCITONIN: Procalcitonin: 0.21 ng/mL

## 2019-12-21 LAB — CBC WITH DIFFERENTIAL/PLATELET
Abs Immature Granulocytes: 0.17 10*3/uL — ABNORMAL HIGH (ref 0.00–0.07)
Basophils Absolute: 0.1 10*3/uL (ref 0.0–0.1)
Basophils Relative: 0 %
Eosinophils Absolute: 0 10*3/uL (ref 0.0–0.5)
Eosinophils Relative: 0 %
HCT: 19.3 % — ABNORMAL LOW (ref 39.0–52.0)
Hemoglobin: 6.6 g/dL — CL (ref 13.0–17.0)
Immature Granulocytes: 1 %
Lymphocytes Relative: 6 %
Lymphs Abs: 1.4 10*3/uL (ref 0.7–4.0)
MCH: 33.8 pg (ref 26.0–34.0)
MCHC: 34.2 g/dL (ref 30.0–36.0)
MCV: 99 fL (ref 80.0–100.0)
Monocytes Absolute: 1.5 10*3/uL — ABNORMAL HIGH (ref 0.1–1.0)
Monocytes Relative: 6 %
Neutro Abs: 21.5 10*3/uL — ABNORMAL HIGH (ref 1.7–7.7)
Neutrophils Relative %: 87 %
Platelets: 351 10*3/uL (ref 150–400)
RBC: 1.95 MIL/uL — ABNORMAL LOW (ref 4.22–5.81)
RDW: 13.2 % (ref 11.5–15.5)
WBC: 24.6 10*3/uL — ABNORMAL HIGH (ref 4.0–10.5)
nRBC: 0 % (ref 0.0–0.2)

## 2019-12-21 LAB — URINALYSIS, COMPLETE (UACMP) WITH MICROSCOPIC
Bilirubin Urine: NEGATIVE
Glucose, UA: NEGATIVE mg/dL
Hgb urine dipstick: NEGATIVE
Ketones, ur: NEGATIVE mg/dL
Nitrite: NEGATIVE
Protein, ur: 30 mg/dL — AB
Specific Gravity, Urine: 1.012 (ref 1.005–1.030)
pH: 5 (ref 5.0–8.0)

## 2019-12-21 LAB — CBC
HCT: 22.6 % — ABNORMAL LOW (ref 39.0–52.0)
Hemoglobin: 7.7 g/dL — ABNORMAL LOW (ref 13.0–17.0)
MCH: 32 pg (ref 26.0–34.0)
MCHC: 34.1 g/dL (ref 30.0–36.0)
MCV: 93.8 fL (ref 80.0–100.0)
Platelets: 266 10*3/uL (ref 150–400)
RBC: 2.41 MIL/uL — ABNORMAL LOW (ref 4.22–5.81)
RDW: 14.6 % (ref 11.5–15.5)
WBC: 12 10*3/uL — ABNORMAL HIGH (ref 4.0–10.5)
nRBC: 0 % (ref 0.0–0.2)

## 2019-12-21 LAB — PHOSPHORUS: Phosphorus: 4.9 mg/dL — ABNORMAL HIGH (ref 2.5–4.6)

## 2019-12-21 LAB — PREPARE RBC (CROSSMATCH)

## 2019-12-21 LAB — CORTISOL: Cortisol, Plasma: 25.7 ug/dL

## 2019-12-21 LAB — MAGNESIUM: Magnesium: 1.6 mg/dL — ABNORMAL LOW (ref 1.7–2.4)

## 2019-12-21 MED ORDER — DIPHENHYDRAMINE HCL 50 MG/ML IJ SOLN
12.5000 mg | Freq: Once | INTRAMUSCULAR | Status: AC
  Administered 2019-12-21: 12.5 mg via INTRAVENOUS
  Filled 2019-12-21: qty 1

## 2019-12-21 MED ORDER — SODIUM CHLORIDE 0.9 % IV SOLN
INTRAVENOUS | Status: DC | PRN
  Administered 2019-12-21: 250 mL via INTRAVENOUS

## 2019-12-21 MED ORDER — MIDODRINE HCL 5 MG PO TABS
5.0000 mg | ORAL_TABLET | Freq: Three times a day (TID) | ORAL | Status: DC
  Administered 2019-12-21 – 2019-12-22 (×3): 5 mg via ORAL
  Filled 2019-12-21 (×3): qty 1

## 2019-12-21 MED ORDER — SODIUM CHLORIDE 0.9% IV SOLUTION: Freq: Once | INTRAVENOUS | Status: DC

## 2019-12-21 MED ORDER — ALBUMIN HUMAN 25 % IV SOLN
50.0000 g | Freq: Once | INTRAVENOUS | Status: AC
  Administered 2019-12-21: 50 g via INTRAVENOUS
  Filled 2019-12-21: qty 200

## 2019-12-21 MED ORDER — MAGNESIUM SULFATE 50 % IJ SOLN
3.0000 g | Freq: Once | INTRAVENOUS | Status: AC
  Administered 2019-12-21: 3 g via INTRAVENOUS
  Filled 2019-12-21: qty 6

## 2019-12-21 NOTE — TOC Initial Note (Addendum)
Transition of Care [facility]) - Initial/Assessment Note    Patient Details  Name: Lee Wang MRN: [id number] Date of Birth: 1945-04-03  Transition of Care [facility]) CM/SW Contact:    Lee Chars, LCSW Phone Number: 12/21/2019, 3:31 PM  Clinical Narrative:  CSW spoke with pt regarding discharge plan.  Pt is from [facility], stated he would be OK to return there.  Permission given to speak to son and daughter.  Pt reports he is not vaccinated for covid.  CSW spoke with daughter Lee Wang who confirms this as well.  Pt was at [facility] for short term rehab, normally lives alone.  She is in agreement with plan for return there for more rehab.  No PCP listed and daughter is not aware of current PCP--previous one retired.     CSW spoke with Lee Wang at [facility].  She confirmed pt is resident there, she is off site currently and will confirm, but does not think there is an issue with pt returning at discharge.               Expected Discharge Plan: Skilled Nursing Facility Barriers to Discharge: Continued Medical Work up   Patient Goals and CMS Choice   CMS Medicare.gov Compare Post Acute Care list provided to::  (pt came from [facility], wants to return)    Expected Discharge Plan and Services Expected Discharge Plan: [place] In-house Referral: Clinical Social Work   Post Acute Care Choice: Resumption of Svcs/PTA Provider Living arrangements for the past 2 months: Single Family Home                                      Prior Living Arrangements/Services Living arrangements for the past 2 months: Single Family Home Lives with:: Self Patient language and need for interpreter reviewed:: Yes Do you feel safe going back to the place where you live?: Yes      Need for Family Participation in Patient Care: Yes (Comment) Care giver support system in place?: Yes (comment)   Criminal Activity/Legal Involvement Pertinent to Current  Situation/Hospitalization: No - Comment as needed  Activities of Daily Living Home Assistive Devices/Equipment: Dentures (specify type), Cane (specify quad or straight), Walker (specify type), Wheelchair ADL Screening (condition at time of admission) Patient's cognitive ability adequate to safely complete daily activities?: Yes Is the patient deaf or have difficulty hearing?: Yes Does the patient have difficulty seeing, even when wearing glasses/contacts?: No Does the patient have difficulty concentrating, remembering, or making decisions?: No Patient able to express need for assistance with ADLs?: Yes Does the patient have difficulty dressing or bathing?: No Independently performs ADLs?: Yes (appropriate for developmental age) Does the patient have difficulty walking or climbing stairs?: Yes Weakness of Legs: Right Weakness of Arms/Hands: None  Permission Sought/Granted Permission sought to share information with : Facility Sport and exercise psychologist, Family Supports Permission granted to share information with : Yes, Verbal Permission Granted  Share Information with NAME: son Lee Wang, daughter Lee Wang  Permission granted to share info w AGENCY: [facility]        Emotional Assessment Appearance:: Appears stated age Attitude/Demeanor/Rapport: Complaining Affect (typically observed): Irritable Orientation: : Oriented to Self, Oriented to  Time, Oriented to Situation Alcohol / Substance Use: Not Applicable Psych Involvement: No (comment)  Admission diagnosis:  Closed right ankle fracture, sequela [S82.891S] Patient Active Problem List   Diagnosis Date Noted  .  Postprocedural hypotension   . Shock ([place])   . Rupture of operation wound   . Closed right ankle fracture, sequela 12/13/2019  . Exposed orthopaedic hardware ([place]) 12/13/2019  . Avulsion fracture of metatarsal bone of right foot with delayed healing 11/20/2019  . Left rib fracture 11/20/2019  . Hyponatremia 11/20/2019  .  Alcohol dependence ([place]) 11/20/2019  . Closed right ankle fracture 11/20/2019  . Tobacco abuse 11/20/2019  . Closed trimalleolar fracture of right ankle   . Greater tuberosity of humerus fracture 10/14/2016  . Closed fracture dislocation of joint of left shoulder girdle 10/08/2016  . GAD (generalized anxiety disorder) 03/09/2015  . Elevated LFTs 11/05/2014  . Abnormal [place] (ultrasound) of abdomen 11/05/2014  . Hepatomegaly 11/05/2014  . History of colonic polyps 11/05/2014  . HTN (hypertension) 05/28/2010  . Hyperlipemia 05/28/2010  . Depression with anxiety 05/28/2010  . Prostate CA ([place]) 05/28/2010  . BPH (benign prostatic hyperplasia) 05/28/2010  . Alcohol abuse 07/18/2009  . HEMATOCHEZIA 07/18/2009  . DIARRHEA 07/18/2009  . ABDOMINAL PAIN, UNSPECIFIED SITE 07/18/2009   PCP:  Patient, No Pcp Per Pharmacy:   [facility] [address] [place] [address] Phone: [phone] Fax: [phone]     Social Determinants of Health (SDOH) Interventions    Readmission Risk Interventions Readmission Risk Prevention Plan 12/15/2019  Transportation Screening Complete  Medication Review (RN CM) Complete  Some recent data might be hidden

## 2019-12-21 NOTE — Progress Notes (Signed)
PROGRESS NOTE    Lee Wang  LYY:[id number] DOB: [birth year] DOA: 12/13/2019 PCP: Lee Wang     Brief Narrative:  74 year old WM PMHx Etoh abuse, BPH, prostate cancer , tobacco abuse, HLD and HTN   who had a witnessed mechanical fall after missing a step on 11/19/2019 subsequently imaging studies demonstrated fracture dislocation of the ankle and acute fracture (avulsion) through the base of the fifth metatarsal, he underwent Closed reduction and splinting of bimalleolar right ankle fractureon 11/20/19, on 11/30/2019 patient underwent-Removal of pre-existing plate and screws from the fibula with revision ORIF of the fibula with a locking plate. ORIF of the vertical shear fracture of the medial malleolus -On 12/14/2019 patient was found to have failure of hardware with essentially conversion of his close fractures and open fracture, on 12/14/19 patient underwent Removal of hardware from right ankle,irrigation and debridement,unsuccessful attempt at placement of external fixation and splint application Transferred to [place] from [facility] for definitive care .  Currently plan is treat hyponatremia.  Right tibiocalcaneal fusion tomorrow.   Subjective: 11/11 afebrile overnight, some bradycardia.  A/O x1 (does not know where, when, why).  Cooperative pleasant follows all commands   Assessment & Plan: Covid vaccination;   Principal Problem:   Closed right ankle fracture, sequela Active Problems:   Alcohol abuse   HTN (hypertension)   Depression with anxiety   Prostate CA (HCC)   BPH (benign prostatic hyperplasia)   Hyponatremia   Alcohol dependence (HCC)   Closed right ankle fracture   Tobacco abuse   Rupture of operation wound   Postprocedural hypotension   Shock ([place])   Acute metabolic encephalopathy/ Hypoosmolar Hyponatremia.   -Multifactorial EtOH abuse, poor p.o. intake. -Normal saline 49ml/hr -Monitor closely for further mental  status changes  -Monitor closely sodium level -Hold all medication which will lower sodium level. -Lexapro (hold) -NaCl tablets 2 g TID Lab Results  Component Value Date   NA 129 (L) 12/21/2019   NA 126 (L) 12/20/2019   NA 127 (L) 12/20/2019   NA 127 (L) 12/19/2019   NA 124 (L) 12/19/2019    EtOH abuse -1 mg daily -Thiamine 100 mg daily -Seizure precaution  RIGHT ankle fracture with failed hardware. Presented with an open wound with failed hardware on 12/14/2019. Underwent removal of the hardware, irrigation and debridement as well as unsuccessful attempt at placement of external fixation on 12/14/2019. Patient was on IV antibiotics. Patient is currently transferred to [facility] for ankle fusion by Dr. Sharol Given tentatively scheduled for Wednesday, 12/20/2019 -.11/10  RIGHT TIBIOCALCANEAL FUSION -11/11 Wang orthopedic surgery STRICT nonweightbearing RIGHT lower extremity  Hypotension/Essential HTN -HTN currently not a factor -Wang PACU staff patient receiving Phenyl ephedrine during surgery and unable to titrate patient off without dropping BP. -11/10 Albumin 12.5 g + normal saline -Continue Phenyl ephedrine, continue to attempt to wean patient off in the ICU.  Currently at 80 mcg/min -Map goal> 65 -11/11 Phenylephedrine 70 mcg/min -11/11 albumin 50 g + normal saline 15ml/hr  Acute blood loss anemia/acute on chronic iron deficiency anemia. -Patient has had multiple surgeries in the past 30 days secondary to fractured ankle  -11/11 transfuse 2 unit PRBC -11/11 ADDENDUM; possible blood transfusion reaction paged by [name] who states 1 hour post second unit of PRBC patient has red welts on his legs buttocks arms that are pruritic.  Negative airway compromise. -Benadryl IV 12.5 mg x 1.  For possible blood transfusion reaction -Transfuse for hemoglobin<7  Tobacco abuse. -Holding off on nicotine patch due to confusion.  Monitor.  Depression and anxiety. -No suicidal  ideation. -No significant anxiety as well. -Lexapro 10 mg daily (hold).  Secondary to hyponatremia  Incidentally RSV positive. -Continue droplet precaution.  No respiratory symptoms for now.  Hypomagnesmia -Magnesium goal> 2 -11/11 magnesium IV 3 g    DVT prophylaxis: Hold until 11/11 just completed long surgery Code Status: Full Family Communication:  Status is: Inpatient    Dispo: The patient is from: Home              Anticipated d/c is to: SNF              Anticipated d/c date is:??              Patient currently unstable      Consultants:  Orthopedic surgery Dr. Sharol Given   Procedures/Significant Events:  11/10  RIGHT TIBIOCALCANEAL FUSION    I have personally reviewed and interpreted all radiology studies and my findings are as above.  VENTILATOR SETTINGS: Nasal cannula 11/11 Flow 2 L/min SPO2 98%   Cultures 11/3 SARS coronavirus negative 11/3 influenza A/B negative 11/3 positive RSV   Antimicrobials: Anti-infectives (From admission, onward)   Start     Ordered Stop   12/20/19 0815  ceFAZolin (ANCEF) IVPB 2g/100 mL premix        12/20/19 0722 12/20/19 1255   12/15/19 0900  cefTRIAXone (ROCEPHIN) 1 g in sodium chloride 0.9 % 100 mL IVPB        12/15/19 0803 12/18/19 0736   12/14/19 1400  ceFAZolin (ANCEF) IVPB 2g/100 mL premix        12/14/19 1158 12/15/19 2049   12/14/19 0600  ceFAZolin (ANCEF) IVPB 2g/100 mL premix        12/13/19 1316 12/14/19 0807   12/13/19 1415  ceFAZolin (ANCEF) IVPB 2g/100 mL premix        12/13/19 1315 12/14/19 1962       Devices    LINES / TUBES:      Continuous Infusions: . sodium chloride 75 mL/hr at 12/20/19 2038  . phenylephrine (NEO-SYNEPHRINE) Adult infusion 70 mcg/min (12/21/19 0547)     Objective: Vitals:   12/21/19 0400 12/21/19 0500 12/21/19 0600 12/21/19 0800  BP: (!) 144/64 (!) 135/54 (!) 128/58 (!) 110/56  Pulse: (!) 48 (!) 49 64 (!) 54  Resp: 15 18 12 11   Temp: 98.7 F (37.1 C)   98.2  F (36.8 C)  TempSrc: Oral   Oral  SpO2: 100% 100% 100% 98%  Weight:      Height:        Intake/Output Summary (Last 24 hours) at 12/21/2019 0851 Last data filed at 12/20/2019 1833 Gross Wang 24 hour  Intake 1050 ml  Output 1010 ml  Net 40 ml   Filed Weights   12/13/19 1302 12/17/19 1248 12/20/19 1106  Weight: 56.2 kg 64.3 kg 64.3 kg   Physical Exam:  General: A/O x1 (does not know where, when, why) No acute respiratory distress, cachectic Eyes: negative scleral hemorrhage, negative anisocoria, negative icterus ENT: Negative Runny nose, negative gingival bleeding, Neck:  Negative scars, masses, torticollis, lymphadenopathy, JVD Lungs: Clear to auscultation bilaterally without wheezes or crackles Cardiovascular: Regular rate and rhythm without murmur gallop or rub normal S1 and S2 Abdomen: negative abdominal pain, nondistended, positive soft, bowel sounds, no rebound, no ascites, no appreciable mass Extremities: RIGHT lower extremity with wound VAC in place, draining serosanguineous fluid.  Toes are warm  to touch,  skin: Negative rashes, lesions, ulcers Psychiatric:  Negative depression, negative anxiety, negative fatigue, negative mania  Central nervous system:  Cranial nerves II through XII intact, tongue/uvula midline, all extremities muscle strength 5/5, sensation intact throughout,  negative dysarthria, negative expressive aphasia, negative receptive aphasia.   .     Data Reviewed: Care during the described time interval was provided by me .  I have reviewed this patient's available data, including medical history, events of note, physical examination, and all test results as part of my evaluation.  CBC: Recent Labs  Lab 12/17/19 0555 12/18/19 0230 12/20/19 0307 12/20/19 0855 12/21/19 0251  WBC 6.6 6.0 6.9 6.1 24.6*  NEUTROABS  --   --   --  4.0 21.5*  HGB 7.9* 7.5* 8.3* 8.2* 6.6*  HCT 22.8* 22.2* 24.5* 24.1* 19.3*  MCV 95.4 94.1 95.7 96.0 99.0  PLT 283 264 310  302 759   Basic Metabolic Panel: Recent Labs  Lab 12/19/19 1657 12/19/19 2157 12/20/19 0307 12/20/19 0855 12/21/19 0251  NA 124* 127* 127* 126* 129*  K 4.0 4.2 3.9 4.1 5.1  CL 94* 95* 96* 96* 99  CO2 25 22 24 25  19*  GLUCOSE 137* 99 90 88 94  BUN <5* 6* <5* <5* 12  CREATININE 0.57* 0.58* 0.54* 0.52* 0.94  CALCIUM 8.5* 8.7* 8.7* 8.7* 8.4*  MG  --   --   --  1.7 1.6*  PHOS  --   --   --  3.4 4.9*   GFR: Estimated Creatinine Clearance: 62.7 mL/min (by C-G formula based on SCr of 0.94 mg/dL). Liver Function Tests: Recent Labs  Lab 12/20/19 0855 12/21/19 0251  AST 31 47*  ALT 20 27  ALKPHOS 132* 99  BILITOT 1.1 1.0  PROT 5.4* 4.7*  ALBUMIN 2.5* 2.7*   No results for input(s): LIPASE, AMYLASE in the last 168 hours. No results for input(s): AMMONIA in the last 168 hours. Coagulation Profile: No results for input(s): INR, PROTIME in the last 168 hours. Cardiac Enzymes: No results for input(s): CKTOTAL, CKMB, CKMBINDEX, TROPONINI in the last 168 hours. BNP (last 3 results) No results for input(s): PROBNP in the last 8760 hours. HbA1C: No results for input(s): HGBA1C in the last 72 hours. CBG: No results for input(s): GLUCAP in the last 168 hours. Lipid Profile: No results for input(s): CHOL, HDL, LDLCALC, TRIG, CHOLHDL, LDLDIRECT in the last 72 hours. Thyroid Function Tests: No results for input(s): TSH, T4TOTAL, FREET4, T3FREE, THYROIDAB in the last 72 hours. Anemia Panel: No results for input(s): VITAMINB12, FOLATE, FERRITIN, TIBC, IRON, RETICCTPCT in the last 72 hours. Sepsis Labs: Recent Labs  Lab 12/20/19 1820 12/21/19 0251  PROCALCITON <0.10 0.21    Recent Results (from the past 240 hour(s))  Respiratory Panel by RT PCR (Flu A&B, Covid) - Nasopharyngeal Swab     Status: None   Collection Time: 12/13/19  1:15 PM   Specimen: Nasopharyngeal Swab  Result Value Ref Range Status   SARS Coronavirus 2 by RT PCR NEGATIVE NEGATIVE Final    Comment:  (NOTE) SARS-CoV-2 target nucleic acids are NOT DETECTED.  The SARS-CoV-2 RNA is generally detectable in upper respiratoy specimens during the acute phase of infection. The lowest concentration of SARS-CoV-2 viral copies this assay can detect is 131 copies/mL. A negative result does not preclude SARS-Cov-2 infection and should not be used as the sole basis for treatment or other patient management decisions. A negative result may occur with  improper specimen collection/handling, submission  of specimen other than nasopharyngeal swab, presence of viral mutation(s) within the areas targeted by this assay, and inadequate number of viral copies (<131 copies/mL). A negative result must be combined with clinical observations, patient history, and epidemiological information. The expected result is Negative.  Fact Sheet for Patients:  PinkCheek.be  Fact Sheet for Healthcare Providers:  GravelBags.it  This test is no t yet approved or cleared by the [place] FDA and  has been authorized for detection and/or diagnosis of SARS-CoV-2 by FDA under an Emergency Use Authorization (EUA). This EUA will remain  in effect (meaning this test can be used) for the duration of the COVID-19 declaration under Section 564(b)(1) of the Act, 21 U.S.C. section 360bbb-3(b)(1), unless the authorization is terminated or revoked sooner.     Influenza A by PCR NEGATIVE NEGATIVE Final   Influenza B by PCR NEGATIVE NEGATIVE Final    Comment: (NOTE) The Xpert Xpress SARS-CoV-2/FLU/RSV assay is intended as an aid in  the diagnosis of influenza from Nasopharyngeal swab specimens and  should not be used as a sole basis for treatment. Nasal washings and  aspirates are unacceptable for Xpert Xpress SARS-CoV-2/FLU/RSV  testing.  Fact Sheet for Patients: PinkCheek.be  Fact Sheet for Healthcare  Providers: GravelBags.it  This test is not yet approved or cleared by the [place] FDA and  has been authorized for detection and/or diagnosis of SARS-CoV-2 by  FDA under an Emergency Use Authorization (EUA). This EUA will remain  in effect (meaning this test can be used) for the duration of the  Covid-19 declaration under Section 564(b)(1) of the Act, 21  U.S.C. section 360bbb-3(b)(1), unless the authorization is  terminated or revoked. Performed at [facility], [address] [place], [place] [address]   Resp Panel by RT PCR (RSV, Flu A&B, Covid) -     Status: Abnormal   Collection Time: 12/13/19  1:15 PM  Result Value Ref Range Status   SARS Coronavirus 2 by RT PCR NEGATIVE NEGATIVE Final    Comment: (NOTE) SARS-CoV-2 target nucleic acids are NOT DETECTED.  The SARS-CoV-2 RNA is generally detectable in upper respiratoy specimens during the acute phase of infection. The lowest concentration of SARS-CoV-2 viral copies this assay can detect is 131 copies/mL. A negative result does not preclude SARS-Cov-2 infection and should not be used as the sole basis for treatment or other patient management decisions. A negative result may occur with  improper specimen collection/handling, submission of specimen other than nasopharyngeal swab, presence of viral mutation(s) within the areas targeted by this assay, and inadequate number of viral copies (<131 copies/mL). A negative result must be combined with clinical observations, patient history, and epidemiological information. The expected result is Negative.  Fact Sheet for Patients:  PinkCheek.be  Fact Sheet for Healthcare Providers:  GravelBags.it  This test is no t yet approved or cleared by the [place] FDA and  has been authorized for detection and/or diagnosis of SARS-CoV-2 by FDA under an Emergency Use Authorization (EUA). This EUA  will remain  in effect (meaning this test can be used) for the duration of the COVID-19 declaration under Section 564(b)(1) of the Act, 21 U.S.C. section 360bbb-3(b)(1), unless the authorization is terminated or revoked sooner.     Influenza A by PCR NEGATIVE NEGATIVE Final   Influenza B by PCR NEGATIVE NEGATIVE Final    Comment: (NOTE) The Xpert Xpress SARS-CoV-2/FLU/RSV assay is intended as an aid in  the diagnosis of influenza from Nasopharyngeal swab specimens and  should not be used as a sole basis for treatment. Nasal washings and  aspirates are unacceptable for Xpert Xpress SARS-CoV-2/FLU/RSV  testing.  Fact Sheet for Patients: PinkCheek.be  Fact Sheet for Healthcare Providers: GravelBags.it  This test is not yet approved or cleared by the [place] FDA and  has been authorized for detection and/or diagnosis of SARS-CoV-2 by  FDA under an Emergency Use Authorization (EUA). This EUA will remain  in effect (meaning this test can be used) for the duration of the  Covid-19 declaration under Section 564(b)(1) of the Act, 21  U.S.C. section 360bbb-3(b)(1), unless the authorization is  terminated or revoked.    Respiratory Syncytial Virus by PCR POSITIVE (A) NEGATIVE Final    Comment: (NOTE) Fact Sheet for Patients: PinkCheek.be  Fact Sheet for Healthcare Providers: GravelBags.it  This test is not yet approved or cleared by the [place] FDA and  has been authorized for detection and/or diagnosis of SARS-CoV-2 by  FDA under an Emergency Use Authorization (EUA). This EUA will remain  in effect (meaning this test can be used) for the duration of the  COVID-19 declaration under Section 564(b)(1) of the Act, 21 U.S.C.  section 360bbb-3(b)(1), unless the authorization is terminated or  revoked. Performed at [facility], [address] [place], [place]  [address]   Surgical pcr screen     Status: None   Collection Time: 12/13/19  3:58 PM   Specimen: Nasal Mucosa; Nasal Swab  Result Value Ref Range Status   MRSA, PCR NEGATIVE NEGATIVE Final   Staphylococcus aureus NEGATIVE NEGATIVE Final    Comment: (NOTE) The Xpert SA Assay (FDA approved for NASAL specimens in patients 50 years of age and older), is one component of a comprehensive surveillance program. It is not intended to diagnose infection nor to guide or monitor treatment. Performed at [facility], [address] [place], [place] [address]   MRSA PCR Screening     Status: None   Collection Time: 12/20/19  8:13 PM   Specimen: Nasal Mucosa; Nasopharyngeal  Result Value Ref Range Status   MRSA by PCR NEGATIVE NEGATIVE Final    Comment:        The GeneXpert MRSA Assay (FDA approved for NASAL specimens only), is one component of a comprehensive MRSA colonization surveillance program. It is not intended to diagnose MRSA infection nor to guide or monitor treatment for MRSA infections. Performed at [facility], [place] [address], [place], [place] [address]   Culture, blood (routine x 2)     Status: None (Preliminary result)   Collection Time: 12/20/19  9:23 PM   Specimen: BLOOD RIGHT HAND  Result Value Ref Range Status   Specimen Description BLOOD RIGHT HAND  Final   Special Requests   Final    BOTTLES DRAWN AEROBIC ONLY Blood Culture adequate volume   Culture   Final    NO GROWTH < 12 HOURS Performed at [facility], [place] [address], [place], [place] [address]    Report Status PENDING  Incomplete  Culture, blood (routine x 2)     Status: None (Preliminary result)   Collection Time: 12/20/19  9:33 PM   Specimen: BLOOD LEFT HAND  Result Value Ref Range Status   Specimen Description BLOOD LEFT HAND  Final   Special Requests   Final    BOTTLES DRAWN AEROBIC ONLY Blood Culture results may not be optimal due to an inadequate volume of blood received in culture bottles  Culture   Final    NO GROWTH < 12 HOURS Performed at [facility], [place] [address], [place], [place] [address]    Report Status PENDING  Incomplete         Radiology Studies: US Abdomen Complete  Result Date: 12/21/2019 CLINICAL DATA:  Cirrhosis EXAM: ABDOMEN ULTRASOUND COMPLETE COMPARISON:  04/11/2013 FINDINGS: Gallbladder: The gallbladder is partially decompressed resulting in artifactual mild wall thickening. Trace pericholecystic fluid is present, nonspecific. No intraluminal stones or sludge is identified. The gallbladder is not distended. The sonographic Percell Miller sign is reportedly negative. Common bile duct: Diameter: 3 mm Liver: The liver demonstrates a progressively nodular contour and increasing heterogeneity of the parenchymal echotexture in keeping progressive changes of cirrhosis. No focal intrahepatic masses identified. No intrahepatic biliary ductal dilation. Portal vein is patent on color Doppler imaging with normal direction of blood flow towards the liver. IVC: No abnormality visualized. Pancreas: Visualized portion unremarkable. Spleen: Size and appearance within normal limits. Right Kidney: Length: 9.9 cm. Echogenicity within normal limits. No mass or hydronephrosis visualized. Left Kidney: Length: 10 cm. Echogenicity within normal limits. No mass or hydronephrosis visualized. Abdominal aorta: No aneurysm visualized. Other findings: None. IMPRESSION: Progressive parenchymal changes in keeping with cirrhosis. No focal liver mass identified. Trace pericholecystic fluid is nonspecific and may represent trace perihepatic ascites in the setting of cirrhosis. Electronically Signed   By: Fidela Salisbury MD   On: 12/21/2019 04:06        Scheduled Meds: . sodium chloride   Intravenous Once  . sodium chloride   Intravenous Once  . aspirin EC  81 mg Oral BID  . atorvastatin  40 mg Oral Daily  . Chlorhexidine Gluconate Cloth  6 each Topical Daily  . docusate sodium  100 mg  Oral BID  . feeding supplement  237 mL Oral BID BM  . ferrous sulfate  325 mg Oral BID WC  . folic acid  1 mg Oral Daily  . midodrine  5 mg Oral TID WC  . multivitamin with minerals  1 tablet Oral Daily  . sodium chloride  2 g Oral TID  . thiamine  100 mg Oral Daily   Or  . thiamine  100 mg Intravenous Daily  . vitamin B-12  1,000 mcg Oral Daily   Continuous Infusions: . sodium chloride 75 mL/hr at 12/20/19 2038  . phenylephrine (NEO-SYNEPHRINE) Adult infusion 70 mcg/min (12/21/19 0547)     LOS: 8 days    Time spent:40 min    Rilee Wendling, Geraldo Docker, MD [facility] Pager [phone]  If 7PM-7AM, please contact night-coverage [email or web address] Password [facility] 12/21/2019, 8:51 AM

## 2019-12-21 NOTE — Progress Notes (Signed)
NAME:  ELOISE MULA, MRN:  [id number], DOB:  1945-11-06, LOS: 8 ADMISSION DATE:  12/13/2019, CONSULTATION DATE:  11/10 REFERRING MD:  Sherral Hammers, CHIEF COMPLAINT:  Post operative low blood pressure   Brief History   74 y/o male initially admitted to [facility] after he tripped and fell, broke his R ankle.  Had ORIF on 10/21, but didn't heal appropriately, then taken back to OR on 11/10 for tibiocalcaneal fusion.  PCCM consulted due to post operative hypotension.  Past Medical History   Past Medical History:  Diagnosis Date  . Anxiety   . Arthritis   . Cancer [facility]) prostate   2013  . Depression   . Heavy drinker of alcohol   . Hyperlipidemia   . Hypertension      Significant Hospital Events   10/11 > initial admit to [facility]. 10/13 > discharged from [facility]. 10/21 > ORIF and removal of old hardware. 11/4 > removal of hardware and I&D. 11/7 > transfer to [facility]. 11/10 > OR for tibiocalcaneal fusion.  Consults:  Ortho, PCCM  Procedures:    Significant Diagnostic Tests:    Micro Data:  Flu 11/3 > neg. COVID 11/3 > neg. Flu 11/3 RSV positive  Antimicrobials:  Ancef peri-op   Interim history/subjective:  Feels OK Denies pain, dyspnea  Objective   Blood pressure (!) 110/56, pulse (!) 54, temperature 98.2 F (36.8 C), temperature source Oral, resp. rate 11, height 5\' 7"  (1.702 m), weight 64.3 kg, SpO2 98 %.        Intake/Output Summary (Last 24 hours) at 12/21/2019 0849 Last data filed at 12/20/2019 1833 Gross per 24 hour  Intake 1050 ml  Output 1010 ml  Net 40 ml   Filed Weights   12/13/19 1302 12/17/19 1248 12/20/19 1106  Weight: 56.2 kg 64.3 kg 64.3 kg    General:  Resting comfortably in bed HENT: NCAT OP clear PULM: CTA B, normal effort CV: RRR, no mgr GI: BS+, soft, nontender MSK: R leg in wrap, drain in place Neuro: awake, alert, no distress, MAEW   Resolved Hospital Problem list     Assessment & Plan:  He is critically ill due to: Hypotension post  operatively/circulatory shock: related to underlying anemia and cirrhosis > check cortisol > give blood today > add midodrine > wean off neosynephrine for MAP > 65 > continue fluids > agree with albumin  Hypo-osmolar Hyponatremia from cirrhosis > free water restrict  AKI > continue IV fluids > Monitor BMET and UOP > Replace electrolytes as needed  R ankle fracture/failed initial ORIF, re-do ORIF 11/10. > per ortho  Normocytic anemia  > Monitor for bleeding > Transfuse PRBC for Hgb < 7 gm/dL > would benefit from outpatient work up  [employer] use > thiamine/folate     Best practice:   Per [facility]  Labs   CBC: Recent Labs  Lab 12/17/19 0555 12/18/19 0230 12/20/19 0307 12/20/19 0855 12/21/19 0251  WBC 6.6 6.0 6.9 6.1 24.6*  NEUTROABS  --   --   --  4.0 21.5*  HGB 7.9* 7.5* 8.3* 8.2* 6.6*  HCT 22.8* 22.2* 24.5* 24.1* 19.3*  MCV 95.4 94.1 95.7 96.0 99.0  PLT 283 264 310 302 409    Basic Metabolic Panel: Recent Labs  Lab 12/19/19 1657 12/19/19 2157 12/20/19 0307 12/20/19 0855 12/21/19 0251  NA 124* 127* 127* 126* 129*  K 4.0 4.2 3.9 4.1 5.1  CL 94* 95* 96* 96* 99  CO2 25 22 24 25  19*  GLUCOSE 137* 99 90 88 94  BUN <5* 6* <5* <5* 12  CREATININE 0.57* 0.58* 0.54* 0.52* 0.94  CALCIUM 8.5* 8.7* 8.7* 8.7* 8.4*  MG  --   --   --  1.7 1.6*  PHOS  --   --   --  3.4 4.9*   GFR: Estimated Creatinine Clearance: 62.7 mL/min (by C-G formula based on SCr of 0.94 mg/dL). Recent Labs  Lab 12/18/19 0230 12/20/19 0307 12/20/19 0855 12/20/19 1820 12/21/19 0251  PROCALCITON  --   --   --  <0.10 0.21  WBC 6.0 6.9 6.1  --  24.6*    Liver Function Tests: Recent Labs  Lab 12/20/19 0855 12/21/19 0251  AST 31 47*  ALT 20 27  ALKPHOS 132* 99  BILITOT 1.1 1.0  PROT 5.4* 4.7*  ALBUMIN 2.5* 2.7*   No results for input(s): LIPASE, AMYLASE in the last 168 hours. No results for input(s): AMMONIA in the last 168 hours.  ABG No results found for: PHART, PCO2ART,  PO2ART, HCO3, TCO2, ACIDBASEDEF, O2SAT   Coagulation Profile: No results for input(s): INR, PROTIME in the last 168 hours.  Cardiac Enzymes: No results for input(s): CKTOTAL, CKMB, CKMBINDEX, TROPONINI in the last 168 hours.  HbA1C: No results found for: HGBA1C  CBG: No results for input(s): GLUCAP in the last 168 hours.   Critical care time: 31 minutes     Roselie Awkward, MD [place] [facility] Pager: [phone] Cell: [phone] If no response, call [phone]

## 2019-12-21 NOTE — Evaluation (Signed)
Physical Therapy Re-Evaluation Patient Details Name: Lee Wang MRN: [id number] DOB: 1945/12/31 Today's Date: 12/21/2019   History of Present Illness  The pt is a 74 yo male presenting s/p R tibiocalcaneal fusion 11/10 following failed previous surgical repairs. Following surgery, pt became hypotensive despite 2L fluid, and was transferred to the ICU for management. PMH includes: prostate cancer, HLD, HTN, and 50 pack-year history of tobacco use, and alcoholism.   Clinical Impression  Pt in bed upon arrival of PT, agreeable to evaluation at this time. Per chart review from [facility], the pt was mobilizing in a WC prior to this surgery, receiving assist from his son at home for ADLs and general home care. The pt was a poor historian during today's session and gave inconsistent answers to questions about home set up, use of equipment, and family support. The pt now presents with limitations in functional mobility, activity tolerance, strength, and stability due to above dx, and will continue to benefit from skilled PT to address these deficits. The pt required minA  To complete bed mobility while maintaining NWB RLE, and was unable to tolerate transition to sitting due to soft BP with MAP of 56-62 in supine. The pt denied sx at this time, but due to low readings, PT will defer assessment of trsitting or OOB  to later session. The pt will likely need continued rehab following d/c to improve endurance, adherence to NWB precautions, and safety with mobility.      Follow Up Recommendations SNF;Supervision for mobility/OOB;Supervision - Intermittent    Equipment Recommendations  None recommended by PT (defer to post acute)    Recommendations for Other Services       Precautions / Restrictions Precautions Precautions: Fall Precaution Comments: CAM boot Rt LE  Restrictions Weight Bearing Restrictions: Yes RLE Weight Bearing: Non weight bearing      Mobility  Bed Mobility Overal bed mobility:  Needs Assistance Bed Mobility: Rolling Rolling: Min assist         General bed mobility comments: assist to prevent WBing through Rt LE     Transfers                 General transfer comment: Unable to attempt due to MAP of 56-62 while supine      Balance                                             Pertinent Vitals/Pain Pain Assessment: Faces Faces Pain Scale: Hurts little more Pain Location: right ankle Pain Descriptors / Indicators: Grimacing;Guarding Pain Intervention(s): Monitored during session    Home Living Family/patient expects to be discharged to:: Private residence Living Arrangements: Alone   Type of Home: Mobile home Home Access: Stairs to enter Entrance Stairs-Rails:  (wall on each side) Entrance Stairs-Number of Steps: 3 Home Layout: One level Home Equipment: Walker - 2 wheels;Shower seat Additional Comments: Pt frequently changing answers to questions through session, may have a son who is available to help, denies WC at home, but previous notes state that pt was using WC predominantly for mobility    Prior Function Level of Independence: Needs assistance   Gait / Transfers Assistance Needed: Per chart review from PT eval at [facility] - Uses RW for transfers and a few steps, has been using wheelchair mostly since last hospital admission  ADL's / Homemaking Assistance Needed: Per chart review  from PT eval at [facility]; Son assisting occasionally with ADLs, I/ADLs  Comments: Per chart review, pt was using w/c for household mobillity, but pt adamantly denies this.  He, however, says he really doesn't remember what and how he was doing just prior to admit      Hand Dominance   Dominant Hand: Right    Extremity/Trunk Assessment   Upper Extremity Assessment Upper Extremity Assessment: Overall WFL for tasks assessed    Lower Extremity Assessment Lower Extremity Assessment: Overall WFL for tasks assessed;RLE deficits/detail RLE Deficits /  Details: able to SLR in supine and complete good ROM at knee, not tested at foot/ankle due to NWB and splint RLE: Unable to fully assess due to immobilization    Cervical / Trunk Assessment Cervical / Trunk Assessment: Normal  Communication   Communication: Receptive difficulties (very slurred and mumbled speech, difficult to understand)  Cognition Arousal/Alertness: Awake/alert Behavior During Therapy: WFL for tasks assessed/performed Overall Cognitive Status: Impaired/Different from baseline Area of Impairment: Orientation;Attention;Memory;Following commands;Safety/judgement;Awareness;Problem solving                 Orientation Level: Disoriented to;Time Current Attention Level: Sustained Memory: Decreased short-term memory;Decreased recall of precautions Following Commands: Follows one step commands consistently Safety/Judgement: Decreased awareness of safety;Decreased awareness of deficits Awareness: Intellectual Problem Solving: Slow processing;Difficulty sequencing;Requires verbal cues;Requires tactile cues General Comments: Pt follows commands, but is easily distracted.  He was unable to recall details of his home environment and PLOF.   He knows he broke his ankle and stated he shouldn't put weight through his Rt ankle, but attempted to scoot up in bed using Rt LE to push with       General Comments General comments (skin integrity, edema, etc.): eval limited due to soft BP with MAP of 56-62 while supine in bed    Exercises General Exercises - Lower Extremity Ankle Circles/Pumps: AROM;Left;5 reps;Supine Heel Slides: AROM;Left;5 reps;Supine (against mod resistance) Hip ABduction/ADduction: AROM;5 reps;Supine Straight Leg Raises: AROM;Both;5 reps;Supine   Assessment/Plan    PT Assessment Patient needs continued PT services  PT Problem List Decreased strength;Decreased activity tolerance;Decreased balance;Decreased mobility;Decreased safety awareness       PT  Treatment Interventions Balance training;Gait training;Stair training;Functional mobility training;DME instruction;Therapeutic activities;Therapeutic exercise;Patient/family education    PT Goals (Current goals can be found in the Care Plan section)  Acute Rehab PT Goals Patient Stated Goal: Pt did not state  PT Goal Formulation: With patient Time For Goal Achievement: 01/04/20 Potential to Achieve Goals: Good    Frequency Min 3X/week   Barriers to discharge        Co-evaluation PT/OT/SLP Co-Evaluation/Treatment: Yes Reason for Co-Treatment: Complexity of the patient's impairments (multi-system involvement);Necessary to address cognition/behavior during functional activity;For patient/therapist safety;To address functional/ADL transfers PT goals addressed during session: Mobility/safety with mobility OT goals addressed during session: ADL's and self-care       AM-PAC PT "6 Clicks" Mobility  Outcome Measure Help needed turning from your back to your side while in a flat bed without using bedrails?: A Little Help needed moving from lying on your back to sitting on the side of a flat bed without using bedrails?: A Little Help needed moving to and from a bed to a chair (including a wheelchair)?: A Lot Help needed standing up from a chair using your arms (e.g., wheelchair or bedside chair)?: A Lot Help needed to walk in hospital room?: A Lot Help needed climbing 3-5 steps with a railing? : A Lot 6 Click Score: 14  End of Session   Activity Tolerance: Patient tolerated treatment well;Patient limited by fatigue (limited by soft BP and low MAP) Patient left: in bed;with call bell/phone within reach;with bed alarm set Nurse Communication: Mobility status PT Visit Diagnosis: Other abnormalities of gait and mobility (R26.89);Unsteadiness on feet (R26.81)    Time: 5284-1324 PT Time Calculation (min) (ACUTE ONLY): 26 min   Charges:   PT Evaluation $PT Re-evaluation: 1 Re-eval           Karma Ganja, PT, DPT   Acute Rehabilitation Department Pager #: [phone]  Otho Bellows 12/21/2019, 2:37 PM

## 2019-12-21 NOTE — NC FL2 (Signed)
[place] LEVEL OF CARE SCREENING TOOL     IDENTIFICATION  Patient Name: Lee Wang Birthdate: July 13, 1945 Sex: male Admission Date (Current Location): 12/13/2019  [facility] and [place] Number:  Herbalist and Address:  [facility], [place] [address] [place], [place] [address]      Provider Number: [id number]  Attending Physician Name and Address:  Allie Bossier, MD  Relative Name and Phone Number:  [place], [place] Daughter [phone]    Current Level of Care: Hospital Recommended Level of Care: [place] Prior Approval Number:    Date Approved/Denied:   PASRR Number: [id number]  Discharge Plan: SNF    Current Diagnoses: Patient Active Problem List   Diagnosis Date Noted  . Postprocedural hypotension   . Shock ([place])   . Rupture of operation wound   . Closed right ankle fracture, sequela 12/13/2019  . Exposed orthopaedic hardware ([place]) 12/13/2019  . Avulsion fracture of metatarsal bone of right foot with delayed healing 11/20/2019  . Left rib fracture 11/20/2019  . Hyponatremia 11/20/2019  . Alcohol dependence ([place]) 11/20/2019  . Closed right ankle fracture 11/20/2019  . Tobacco abuse 11/20/2019  . Closed trimalleolar fracture of right ankle   . Greater tuberosity of humerus fracture 10/14/2016  . Closed fracture dislocation of joint of left shoulder girdle 10/08/2016  . GAD (generalized anxiety disorder) 03/09/2015  . Elevated LFTs 11/05/2014  . Abnormal [place] (ultrasound) of abdomen 11/05/2014  . Hepatomegaly 11/05/2014  . History of colonic polyps 11/05/2014  . HTN (hypertension) 05/28/2010  . Hyperlipemia 05/28/2010  . Depression with anxiety 05/28/2010  . Prostate CA ([place]) 05/28/2010  . BPH (benign prostatic hyperplasia) 05/28/2010  . Alcohol abuse 07/18/2009  . HEMATOCHEZIA 07/18/2009  . DIARRHEA 07/18/2009  . ABDOMINAL PAIN, UNSPECIFIED SITE 07/18/2009    Orientation RESPIRATION BLADDER  Height & Weight     Self, Situation, Place  O2 Continent Weight: 141 lb 12.1 oz (64.3 kg) Height:  5\' 7"  (170.2 cm)  BEHAVIORAL SYMPTOMS/MOOD NEUROLOGICAL BOWEL NUTRITION STATUS      Continent Diet (carb modified.  See discharge summary)  AMBULATORY STATUS COMMUNICATION OF NEEDS Skin   Total Care Verbally Surgical wounds                       Personal Care Assistance Level of Assistance    Bathing Assistance: Maximum assistance Feeding assistance: Independent Dressing Assistance: Maximum assistance     Functional Limitations Info    Sight Info: Adequate Hearing Info: Adequate Speech Info: Adequate    SPECIAL CARE FACTORS FREQUENCY  PT (By licensed PT), OT (By licensed OT)     PT Frequency: 5x week OT Frequency: 5x week            Contractures Contractures Info: Not present    Additional Factors Info    Code Status Info: full Allergies Info: NKA           Current Medications (12/21/2019):  This is the current hospital active medication list Current Facility-Administered Medications  Medication Dose Route Frequency Provider Last Rate Last Admin  . 0.9 %  sodium chloride infusion (Manually program via Guardrails IV Fluids)   Intravenous Once Desai, Rahul P, PA-C      . 0.9 %  sodium chloride infusion (Manually program via Guardrails IV Fluids)   Intravenous Once [name], MD      . 0.9 %  sodium chloride infusion (Manually program via Guardrails IV  Fluids)   Intravenous Once Allie Bossier, MD      . 0.9 %  sodium chloride infusion   Intravenous Continuous Persons, Bevely Palmer, [place] 75 mL/hr at 12/21/19 1041 New Bag at 12/21/19 1041  . aspirin EC tablet 81 mg  81 mg Oral BID Persons, Bevely Palmer, PA   81 mg at 12/21/19 1020  . atorvastatin (LIPITOR) tablet 40 mg  40 mg Oral Daily Persons, Bevely Palmer, PA   40 mg at 12/21/19 1020  . Chlorhexidine Gluconate Cloth 2 % PADS 6 each  6 each Topical Daily Allie Bossier, MD   6 each at 12/20/19 2044  . docusate  sodium (COLACE) capsule 100 mg  100 mg Oral BID Persons, Bevely Palmer, PA   100 mg at 12/21/19 1021  . feeding supplement (ENSURE ENLIVE / ENSURE PLUS) liquid 237 mL  237 mL Oral BID BM Persons, Bevely Palmer, PA   237 mL at 12/19/19 0909  . ferrous sulfate tablet 325 mg  325 mg Oral BID WC Persons, Bevely Palmer, [place]   325 mg at 12/21/19 0840  . folic acid (FOLVITE) tablet 1 mg  1 mg Oral Daily Persons, Bevely Palmer, PA   1 mg at 12/21/19 1020  . HYDROmorphone (DILAUDID) injection 0.5 mg  0.5 mg Intravenous Q4H PRN Persons, Bevely Palmer, PA      . labetalol (NORMODYNE) injection 10 mg  10 mg Intravenous Q4H PRN Persons, Bevely Palmer, PA      . metoCLOPramide (REGLAN) tablet 5-10 mg  5-10 mg Oral Q8H PRN Persons, Bevely Palmer, PA       Or  . metoCLOPramide (REGLAN) injection 5-10 mg  5-10 mg Intravenous Q8H PRN Persons, Bevely Palmer, PA      . midodrine (PROAMATINE) tablet 5 mg  5 mg Oral TID WC Simonne Maffucci B, MD   5 mg at 12/21/19 1300  . multivitamin with minerals tablet 1 tablet  1 tablet Oral Daily Persons, Bevely Palmer, [place]   1 tablet at 12/21/19 1020  . ondansetron (ZOFRAN) tablet 4 mg  4 mg Oral Q6H PRN Persons, Bevely Palmer, PA       Or  . ondansetron [facility]) injection 4 mg  4 mg Intravenous Q6H PRN Persons, Bevely Palmer, PA      . oxyCODONE (Oxy IR/ROXICODONE) immediate release tablet 5-10 mg  5-10 mg Oral Q4H PRN Persons, Bevely Palmer, [place]      . phenylephrine (NEOSYNEPHRINE) 10-0.9 MG/250ML-% infusion  25-200 mcg/min Intravenous Titrated Allie Bossier, MD 60 mL/hr at 12/21/19 1430 40 mcg/min at 12/21/19 1430  . senna-docusate (Senokot-S) tablet 2 tablet  2 tablet Oral QHS PRN Persons, Bevely Palmer, PA      . sodium chloride tablet 2 g  2 g Oral TID Persons, Bevely Palmer, PA   2 g at 12/21/19 1021  . thiamine tablet 100 mg  100 mg Oral Daily Persons, Bevely Palmer, PA   100 mg at 12/21/19 1021   Or  . thiamine (B-1) injection 100 mg  100 mg Intravenous Daily Persons, Bevely Palmer, PA      . vitamin B-12 (CYANOCOBALAMIN) tablet  1,000 mcg  1,000 mcg Oral Daily Persons, Bevely Palmer, PA   1,000 mcg at 12/21/19 1020     Discharge Medications: Please see discharge summary for a list of discharge medications.  Relevant Imaging Results:  Relevant Lab Results:   Additional Information SS # [id number]  Joanne Chars, LCSW

## 2019-12-21 NOTE — Progress Notes (Addendum)
Follow up CBC 7.7, Dr. Sherral Hammers aware. No new orders at this time. Hives no longer present after receiving benadryl.

## 2019-12-21 NOTE — Progress Notes (Signed)
During both PRBC transfusions, vital signs stable and patient denied any symptoms of reaction. One hour after transfusion completion, PT/OT worked with patient and notified this RN of hives on back, buttocks, and posterior upper thighs. This nurse assessed patient and found new hives that were not present at the beginning of shift. Notified blood bank and Dr. Sherral Hammers. Dr. Sherral Hammers ordered benadryl (see MAR) and asked to follow policy for possible reaction.

## 2019-12-21 NOTE — Progress Notes (Signed)
Patient ID: Lee Wang, male   DOB: [birth year], 74 y.o.   MRN: [id number] Patient is postoperative day 1 tibial calcaneal fusion.  There is 350 cc in the wound VAC canister this is essentially unchanged from postoperative drainage.  The fluid is clear serosanguineous drainage.  The dressing is clean and dry there are 2 checks on the seal check.  Patient's hemoglobin is 6.6 he has not received a transfusion I have written an order to transfuse with 2 units of packed red blood cells.  Strict nonweightbearing right lower extremity.

## 2019-12-21 NOTE — Anesthesia Postprocedure Evaluation (Signed)
Anesthesia Post Note  Patient: Lee Wang  Procedure(s) Performed: RIGHT TIBIOCALCANEAL FUSION (Right Ankle)     Patient location during evaluation: PACU Anesthesia Type: Regional and MAC Level of consciousness: awake and alert Pain management: pain level controlled Vital Signs Assessment: post-procedure vital signs reviewed and stable Respiratory status: spontaneous breathing, nonlabored ventilation, respiratory function stable and patient connected to nasal cannula oxygen Cardiovascular status: stable and blood pressure returned to baseline Postop Assessment: no apparent nausea or vomiting Anesthetic complications: no   No complications documented.  Last Vitals:  Vitals:   12/21/19 0815 12/21/19 0900  BP: 114/80 (!) 81/72  Pulse: 65 63  Resp: 13 13  Temp:    SpO2: 98% 100%    Last Pain:  Vitals:   12/21/19 0800  TempSrc: Oral  PainSc: 0-No pain                 Marsena Taff

## 2019-12-21 NOTE — Evaluation (Signed)
Occupational Therapy Evaluation Patient Details Name: Lee Wang MRN: [id number] DOB: 1945/03/03 Today's Date: 12/21/2019    History of Present Illness The pt is a 74 yo male presenting s/p R tibiocalcaneal fusion 11/10 following failed previous surgical repairs. Following surgery, pt became hypotensive despite 2L fluid, and was transferred to the ICU for management. PMH includes: prostate cancer, HLD, HTN, and 50 pack-year history of tobacco use, and alcoholism.    Clinical Impression   Pt admitted with above. He demonstrates the below listed deficits and will benefit from continued OT to maximize safety and independence with BADLs.  Pt presents to OT with generalized weakness, pain, decreased understanding of precautions and safety, decreased activity tolerance, impaired cognition as well as impaired communication (speech slurred and difficult to understand).  He currently requires min A - total A for ADLs at bed level.  Unable to progress to OOB this date due to MAP of 56-62 supine.  He is unable to provide accurate info re: home set up and PLOF, but chart review indicates he has been living his his son, and has required assist with ADLs. Per chart, he was using w/c for mobility.  Recommend SNF level rehab at discharge.       Follow Up Recommendations  SNF    Equipment Recommendations  3 in 1 bedside commode    Recommendations for Other Services       Precautions / Restrictions Precautions Precautions: Fall Precaution Comments: CAM boot Rt LE  Restrictions Weight Bearing Restrictions: Yes RLE Weight Bearing: Non weight bearing      Mobility Bed Mobility Overal bed mobility: Needs Assistance Bed Mobility: Rolling Rolling: Min assist         General bed mobility comments: assist to prevent WBing through Rt LE     Transfers                 General transfer comment: Unable to attempt due to MAP of 56-62 while supine     Balance                                            ADL either performed or assessed with clinical judgement   ADL Overall ADL's : Needs assistance/impaired Eating/Feeding: Set up;Bed level   Grooming: Wash/dry hands;Wash/dry face;Oral care;Brushing hair;Set up;Bed level   Upper Body Bathing: Moderate assistance;Bed level Upper Body Bathing Details (indicate cue type and reason): due to attentional deficits  Lower Body Bathing: Maximal assistance;Bed level   Upper Body Dressing : Moderate assistance;Bed level   Lower Body Dressing: Total assistance;Bed level   Toilet Transfer: Total assistance Toilet Transfer Details (indicate cue type and reason): unable to attempt due to hypotension  Toileting- Clothing Manipulation and Hygiene: Total assistance;Bed level       Functional mobility during ADLs: Minimal assistance (bed mobility to prevent WBing through Rt LE )       Vision Patient Visual Report: No change from baseline       Perception     Praxis      Pertinent Vitals/Pain Pain Assessment: Faces Faces Pain Scale: Hurts little more Pain Location: right ankle Pain Descriptors / Indicators: Grimacing;Guarding Pain Intervention(s): Monitored during session     Hand Dominance Right   Extremity/Trunk Assessment Upper Extremity Assessment Upper Extremity Assessment: Overall WFL for tasks assessed   Lower Extremity Assessment Lower Extremity Assessment: Defer to PT  evaluation   Cervical / Trunk Assessment Cervical / Trunk Assessment: Normal   Communication Communication Communication: Expressive difficulties (speech very slurred - difficult to understand )   Cognition Arousal/Alertness: Awake/alert Behavior During Therapy: WFL for tasks assessed/performed Overall Cognitive Status: Impaired/Different from baseline Area of Impairment: Orientation;Attention;Memory;Following commands;Safety/judgement;Awareness;Problem solving                 Orientation Level: Disoriented  to;Time Current Attention Level: Sustained Memory: Decreased short-term memory;Decreased recall of precautions Following Commands: Follows one step commands consistently Safety/Judgement: Decreased awareness of safety;Decreased awareness of deficits Awareness: Intellectual Problem Solving: Slow processing;Difficulty sequencing;Requires verbal cues;Requires tactile cues General Comments: Pt follows commands, but is easily distracted.  He was unable to recall details of his home environment and PLOF.   He knows he broke his ankle and stated he shouldn't put weight through his Rt ankle, but attempted to scoot up in bed using Rt LE to push with    General Comments  eval limited due to MAP of 56-62    Exercises     Shoulder Instructions      Home Living Family/patient expects to be discharged to:: Private residence Living Arrangements: Alone   Type of Home: Mobile home Home Access: Stairs to enter Entrance Stairs-Number of Steps: 3 Entrance Stairs-Rails:  (wall on each side ) Home Layout: One level     Bathroom Shower/Tub: Tub/shower unit;Curtain   Bathroom Toilet: Standard Bathroom Accessibility: Yes   Home Equipment: Environmental consultant - 2 wheels;Shower seat          Prior Functioning/Environment Level of Independence: Needs assistance  Gait / Transfers Assistance Needed: Per chart review from PT eval at [facility] - Uses RW for transfers and a few steps, has been using wheelchair mostly since last hospital admission ADL's / Homemaking Assistance Needed: Per chart review from PT eval at [facility]; Son assisting occasionally with ADLs, I/ADLs   Comments: Per chart review, pt was using w/c for household mobillity, but pt adamantly denies this.  He, however, says he really doesn't remember what and how he was doing just prior to admit         OT Problem List: Decreased strength;Decreased activity tolerance;Impaired balance (sitting and/or standing);Decreased cognition;Decreased safety  awareness;Decreased knowledge of use of DME or AE;Decreased knowledge of precautions;Cardiopulmonary status limiting activity;Pain      OT Treatment/Interventions: Self-care/ADL training;DME and/or AE instruction;Therapeutic activities;Patient/family education;Balance training;Cognitive remediation/compensation    OT Goals(Current goals can be found in the care plan section) Acute Rehab OT Goals Patient Stated Goal: Pt did not state  OT Goal Formulation: With patient Time For Goal Achievement: 01/04/20 Potential to Achieve Goals: Good ADL Goals Pt Will Perform Grooming: with min assist;standing Pt Will Perform Upper Body Bathing: with set-up;with supervision;sitting Pt Will Perform Lower Body Bathing: with min assist;sit to/from stand;with adaptive equipment Pt Will Perform Upper Body Dressing: with set-up;with supervision;sitting Pt Will Perform Lower Body Dressing: with min assist;with adaptive equipment;sit to/from stand Pt Will Transfer to Toilet: with min assist;bedside commode;stand pivot transfer Pt Will Perform Toileting - Clothing Manipulation and hygiene: with min assist;sit to/from stand Additional ADL Goal #1: Pt will sustain attention to simple ADL tasks x 5 mins with min cues  OT Frequency: Min 2X/week   Barriers to D/C:    unsure of level of support at discharge        Co-evaluation PT/OT/SLP Co-Evaluation/Treatment: Yes Reason for Co-Treatment: Complexity of the patient's impairments (multi-system involvement);Necessary to address cognition/behavior during functional activity;For patient/therapist safety;To address functional/ADL  transfers PT goals addressed during session: Mobility/safety with mobility OT goals addressed during session: ADL's and self-care      AM-PAC OT "6 Clicks" Daily Activity     Outcome Measure Help from another person eating meals?: A Little Help from another person taking care of personal grooming?: A Little Help from another person  toileting, which includes using toliet, bedpan, or urinal?: Total Help from another person bathing (including washing, rinsing, drying)?: A Lot Help from another person to put on and taking off regular upper body clothing?: A Lot Help from another person to put on and taking off regular lower body clothing?: Total 6 Click Score: 12   End of Session Equipment Utilized During Treatment: Other (comment);Oxygen (wound VAC ) Nurse Communication: Mobility status;Other (comment) (rash over LEs and back; hypotension )  Activity Tolerance: Treatment limited secondary to medical complications (Comment) Patient left: in bed;with call bell/phone within reach;with bed alarm set  OT Visit Diagnosis: Unsteadiness on feet (R26.81);Muscle weakness (generalized) (M62.81);Cognitive communication deficit (R41.841);Pain Pain - Right/Left: Right Pain - part of body: Ankle and joints of foot                Time: 5726-2035 OT Time Calculation (min): 24 min Charges:  OT General Charges $OT Visit: 1 Visit OT Evaluation $OT Eval Moderate Complexity: 1 Mod  Nilsa Nutting., OTR/L Acute Rehabilitation Services Pager [phone] Office [phone]   Lucille Passy M 12/21/2019, 1:26 PM

## 2019-12-22 ENCOUNTER — Encounter: Payer: Self-pay | Admitting: Orthopedic Surgery

## 2019-12-22 DIAGNOSIS — S82891S Other fracture of right lower leg, sequela: Secondary | ICD-10-CM | POA: Diagnosis not present

## 2019-12-22 DIAGNOSIS — F1010 Alcohol abuse, uncomplicated: Secondary | ICD-10-CM | POA: Diagnosis not present

## 2019-12-22 DIAGNOSIS — F1020 Alcohol dependence, uncomplicated: Secondary | ICD-10-CM | POA: Diagnosis not present

## 2019-12-22 DIAGNOSIS — F418 Other specified anxiety disorders: Secondary | ICD-10-CM | POA: Diagnosis not present

## 2019-12-22 DIAGNOSIS — S82891G Other fracture of right lower leg, subsequent encounter for closed fracture with delayed healing: Secondary | ICD-10-CM | POA: Diagnosis not present

## 2019-12-22 LAB — CBC WITH DIFFERENTIAL/PLATELET
Abs Immature Granulocytes: 0.05 10*3/uL (ref 0.00–0.07)
Basophils Absolute: 0.1 10*3/uL (ref 0.0–0.1)
Basophils Relative: 1 %
Eosinophils Absolute: 0.2 10*3/uL (ref 0.0–0.5)
Eosinophils Relative: 2 %
HCT: 21.1 % — ABNORMAL LOW (ref 39.0–52.0)
Hemoglobin: 7.1 g/dL — ABNORMAL LOW (ref 13.0–17.0)
Immature Granulocytes: 1 %
Lymphocytes Relative: 15 %
Lymphs Abs: 1.6 10*3/uL (ref 0.7–4.0)
MCH: 31.8 pg (ref 26.0–34.0)
MCHC: 33.6 g/dL (ref 30.0–36.0)
MCV: 94.6 fL (ref 80.0–100.0)
Monocytes Absolute: 0.8 10*3/uL (ref 0.1–1.0)
Monocytes Relative: 8 %
Neutro Abs: 7.8 10*3/uL — ABNORMAL HIGH (ref 1.7–7.7)
Neutrophils Relative %: 73 %
Platelets: 220 10*3/uL (ref 150–400)
RBC: 2.23 MIL/uL — ABNORMAL LOW (ref 4.22–5.81)
RDW: 15.7 % — ABNORMAL HIGH (ref 11.5–15.5)
WBC: 10.5 10*3/uL (ref 4.0–10.5)
nRBC: 0 % (ref 0.0–0.2)

## 2019-12-22 LAB — COMPREHENSIVE METABOLIC PANEL
ALT: 23 U/L (ref 0–44)
AST: 38 U/L (ref 15–41)
Albumin: 2.7 g/dL — ABNORMAL LOW (ref 3.5–5.0)
Alkaline Phosphatase: 72 U/L (ref 38–126)
Anion gap: 8 (ref 5–15)
BUN: 12 mg/dL (ref 8–23)
CO2: 19 mmol/L — ABNORMAL LOW (ref 22–32)
Calcium: 8.2 mg/dL — ABNORMAL LOW (ref 8.9–10.3)
Chloride: 105 mmol/L (ref 98–111)
Creatinine, Ser: 0.68 mg/dL (ref 0.61–1.24)
GFR, Estimated: >60 mL/min (ref >60)
Glucose, Bld: 95 mg/dL (ref 70–99)
Potassium: 3.7 mmol/L (ref 3.5–5.1)
Sodium: 132 mmol/L — ABNORMAL LOW (ref 135–145)
Total Bilirubin: 1.5 mg/dL — ABNORMAL HIGH (ref 0.3–1.2)
Total Protein: 4.3 g/dL — ABNORMAL LOW (ref 6.5–8.1)

## 2019-12-22 LAB — TRANSFUSION REACTION
DAT C3: NEGATIVE
Post RXN DAT IgG: NEGATIVE

## 2019-12-22 LAB — HEMOGLOBIN AND HEMATOCRIT, BLOOD
HCT: 24 % — ABNORMAL LOW (ref 39.0–52.0)
Hemoglobin: 8.2 g/dL — ABNORMAL LOW (ref 13.0–17.0)

## 2019-12-22 LAB — MAGNESIUM: Magnesium: 1.9 mg/dL (ref 1.7–2.4)

## 2019-12-22 LAB — PREPARE RBC (CROSSMATCH)

## 2019-12-22 LAB — PHOSPHORUS: Phosphorus: 2.4 mg/dL — ABNORMAL LOW (ref 2.5–4.6)

## 2019-12-22 MED ORDER — DIPHENHYDRAMINE HCL 50 MG/ML IJ SOLN
12.5000 mg | Freq: Once | INTRAMUSCULAR | Status: AC
  Administered 2019-12-22: 12.5 mg via INTRAVENOUS
  Filled 2019-12-22: qty 1

## 2019-12-22 MED ORDER — SODIUM CHLORIDE 0.9% IV SOLUTION: Freq: Once | INTRAVENOUS | Status: DC

## 2019-12-22 MED ORDER — MIDODRINE HCL 5 MG PO TABS
10.0000 mg | ORAL_TABLET | Freq: Three times a day (TID) | ORAL | Status: DC
  Administered 2019-12-22 – 2019-12-24 (×7): 10 mg via ORAL
  Filled 2019-12-22 (×7): qty 2

## 2019-12-22 NOTE — Progress Notes (Signed)
Physical Therapy Treatment Patient Details Name: Lee Wang MRN: [id number] DOB: 1945-07-17 Today's Date: 12/22/2019    History of Present Illness The pt is a 74 yo male presenting s/p R tibiocalcaneal fusion 11/10 following failed previous surgical repairs. Following surgery, pt became hypotensive despite 2L fluid, and was transferred to the ICU for management. PMH includes: prostate cancer, HLD, HTN, and 50 pack-year history of tobacco use, and alcoholism.     PT Comments    The pt is progressing well with mobility and PT goals at this time. He was able to maintain stable BP while progressing from supine to standing EOB, and was able to initiate lateral movement while maintaining RLE NWB. The pt does continue to demo deficits in strength, power, stability, coordination, and endurance and therefore requires modA of 2 to complete bed mobility, minA of 2 to power up to standing, and modA to maintain stability and facilitate RW movement. The pt will continue to benefit from skilled PT acutely and following d/c to maximize safe return to independence with mobility.     Follow Up Recommendations  SNF;Supervision/Assistance - 24 hour;Supervision for mobility/OOB     Equipment Recommendations   (defer to post acute)    Recommendations for Other Services       Precautions / Restrictions Precautions Precautions: Fall Precaution Comments: CAM boot Rt LE  Restrictions Weight Bearing Restrictions: Yes RLE Weight Bearing: Non weight bearing    Mobility  Bed Mobility Overal bed mobility: Needs Assistance Bed Mobility: Supine to Sit;Sit to Supine     Supine to sit: Min assist;+2 for physical assistance Sit to supine: Min assist;+2 for safety/equipment   General bed mobility comments: minA to move BLE to EOB and minA of 2 to raise trunk from [facility], pt able to steady with UE support in sitting  Transfers Overall transfer level: Needs assistance Equipment used: Rolling walker (2  wheeled) Transfers: Sit to/from Stand Sit to Stand: Min assist;From elevated surface;+2 physical assistance         General transfer comment: minA to power up (x2 through session) with cues for hand placement and maintaining RLE NWB.   Ambulation/Gait Ambulation/Gait assistance: Mod assist;+2 physical assistance Gait Distance (Feet): 3 Feet Assistive device: Rolling walker (2 wheeled) Gait Pattern/deviations: Shuffle Gait velocity: decreased Gait velocity interpretation: <1.31 ft/sec, indicative of household ambulator General Gait Details: pt unable to clear LLE from ground with RLE NWB, but able to wiggle LLE to move laterally with modA of 2 to steady and assist with RW movement      Balance Overall balance assessment: Needs assistance Sitting-balance support: Single extremity supported;Feet unsupported Sitting balance-Leahy Scale: Fair Sitting balance - Comments: able to maintain without UE support for short periods, prefers single or BUE support   Standing balance support: Bilateral upper extremity supported;During functional activity Standing balance-Leahy Scale: Poor Standing balance comment: reliant on BUE support and modA of 2                            Cognition Arousal/Alertness: Awake/alert Behavior During Therapy: WFL for tasks assessed/performed Overall Cognitive Status: Impaired/Different from baseline Area of Impairment: Attention;Memory;Safety/judgement;Awareness;Problem solving                   Current Attention Level: Sustained Memory: Decreased short-term memory;Decreased recall of precautions Following Commands: Follows one step commands consistently Safety/Judgement: Decreased awareness of safety;Decreased awareness of deficits Awareness: Intellectual Problem Solving: Slow processing;Requires verbal cues General Comments:  Pt with improved alertness and was able to recall WB status from PT session yesterday. Remains easily distracted  and tangential at times, difficulty with verbalizing needs and recalling family support levels/ living environment      Exercises      General Comments General comments (skin integrity, edema, etc.): BP stable 120/55-60 sitting EOB. pt receiving blood      Pertinent Vitals/Pain Pain Assessment: Faces Faces Pain Scale: Hurts little more Pain Location: right ankle Pain Descriptors / Indicators: Grimacing;Guarding Pain Intervention(s): Monitored during session;Limited activity within patient's tolerance           PT Goals (current goals can now be found in the care plan section) Acute Rehab PT Goals Patient Stated Goal: Pt did not state  PT Goal Formulation: With patient Time For Goal Achievement: 01/04/20 Potential to Achieve Goals: Good Progress towards PT goals: Progressing toward goals    Frequency    Min 3X/week      PT Plan Current plan remains appropriate       AM-PAC PT "6 Clicks" Mobility   Outcome Measure  Help needed turning from your back to your side while in a flat bed without using bedrails?: A Little Help needed moving from lying on your back to sitting on the side of a flat bed without using bedrails?: A Little Help needed moving to and from a bed to a chair (including a wheelchair)?: A Lot Help needed standing up from a chair using your arms (e.g., wheelchair or bedside chair)?: A Little Help needed to walk in hospital room?: A Lot Help needed climbing 3-5 steps with a railing? : Total 6 Click Score: 14    End of Session Equipment Utilized During Treatment: Gait belt (cam walker boot) Activity Tolerance: Patient tolerated treatment well;Patient limited by fatigue Patient left: in bed;with call bell/phone within reach;with bed alarm set Nurse Communication: Mobility status PT Visit Diagnosis: Other abnormalities of gait and mobility (R26.89);Unsteadiness on feet (R26.81)     Time: 1130-1155 PT Time Calculation (min) (ACUTE ONLY): 25  min  Charges:  $Gait Training: 8-22 mins $Therapeutic Activity: 8-22 mins                     Karma Ganja, PT, DPT   Acute Rehabilitation Department Pager #: [phone]   Otho Bellows 12/22/2019, 12:17 PM

## 2019-12-22 NOTE — Progress Notes (Signed)
Patient is postop day 2 tibial calcaneal fusion.  There is a total of 425 in the back of Kool-Aid colored fluid.  Net of 125 since yesterday.  Patient did receive blood yesterday continues to be difficult to maintain H&H unfortunately had transfusion reaction.   Wound VAC is working well we will continue to follow

## 2019-12-22 NOTE — Progress Notes (Signed)
NAME:  Lee Wang, MRN:  [id number], DOB:  01-14-1946, LOS: 9 ADMISSION DATE:  12/13/2019, CONSULTATION DATE:  11/10 REFERRING MD:  Sherral Hammers, CHIEF COMPLAINT:  Post operative low blood pressure   Brief History   74 y/o male initially admitted to [facility] after he tripped and fell, broke his R ankle.  Had ORIF on 10/21, but didn't heal appropriately, then taken back to OR on 11/10 for tibiocalcaneal fusion.  PCCM consulted due to post operative hypotension.  Past Medical History   Past Medical History:  Diagnosis Date  . Anxiety   . Arthritis   . Cancer [facility]) prostate   2013  . Depression   . Heavy drinker of alcohol   . Hyperlipidemia   . Hypertension     Significant Hospital Events   10/11 > initial admit to [facility]. 10/13 > discharged from [facility]. 10/21 > ORIF and removal of old hardware. 11/4 > removal of hardware and I&D. 11/7 > transfer to [facility]. 11/10 > OR for tibiocalcaneal fusion.  Consults:  Ortho, PCCM  Procedures:    Significant Diagnostic Tests:    Micro Data:  Flu 11/3 > neg. COVID 11/3 > neg. Flu 11/3 RSV positive  Antimicrobials:  Ancef peri-op   Interim history/subjective:   Feeling pretty good this morning Off vasopressors Eating breakfast  Objective   Blood pressure (!) 116/57, pulse 77, temperature 98.4 F (36.9 C), temperature source Oral, resp. rate (!) 22, height 5\' 7"  (1.702 m), weight 64.3 kg, SpO2 98 %.        Intake/Output Summary (Last 24 hours) at 12/22/2019 0805 Last data filed at 12/22/2019 0600 Gross per 24 hour  Intake 6229.37 ml  Output 1115 ml  Net 5114.37 ml   Filed Weights   12/13/19 1302 12/17/19 1248 12/20/19 1106  Weight: 56.2 kg 64.3 kg 64.3 kg    General:  Resting comfortably in bed HENT: NCAT OP clear PULM:  normal effort CV: well perfused GI: non distended MSK: dressing/drain in place Neuro: awake, alert, [facility] Problem list     Assessment & Plan:  Hypotension post operatively  due to blood loss and cirrhosis Maintain off neosynephrine Blood transfusion Continue midodrine  Hypo-osmolar Hyponatremia from cirrhosis Free water restrict  AKI> improved Monitor BMET and UOP Replace electrolytes as needed  R ankle fracture/failed initial ORIF, re-do ORIF 11/10. Per ortho  Normocytic anemia  Monitor for bleeding Transfuse PRBC for Hgb < 7 gm/dL  Etoh use Thiamine/folate  New diagnosis of alcoholic cirrhosis: I counseled the patient about this today Check hepatitis panel Needs referral to GI post hospital discharge  Will write orders to transfer out of ICU PCCM available PRN  Best practice:   Per [facility]  Labs   CBC: Recent Labs  Lab 12/20/19 0307 12/20/19 0855 12/21/19 0251 12/21/19 1411 12/22/19 0309  WBC 6.9 6.1 24.6* 12.0* 10.5  NEUTROABS  --  4.0 21.5*  --  7.8*  HGB 8.3* 8.2* 6.6* 7.7* 7.1*  HCT 24.5* 24.1* 19.3* 22.6* 21.1*  MCV 95.7 96.0 99.0 93.8 94.6  PLT 310 302 351 266 277    Basic Metabolic Panel: Recent Labs  Lab 12/19/19 2157 12/20/19 0307 12/20/19 0855 12/21/19 0251 12/22/19 0309  NA 127* 127* 126* 129* 132*  K 4.2 3.9 4.1 5.1 3.7  CL 95* 96* 96* 99 105  CO2 22 24 25  19* 19*  GLUCOSE 99 90 88 94 95  BUN 6* <5* <5* 12 12  CREATININE 0.58*  0.54* 0.52* 0.94 0.68  CALCIUM 8.7* 8.7* 8.7* 8.4* 8.2*  MG  --   --  1.7 1.6* 1.9  PHOS  --   --  3.4 4.9* 2.4*   GFR: Estimated Creatinine Clearance: 73.7 mL/min (by C-G formula based on SCr of 0.68 mg/dL). Recent Labs  Lab 12/20/19 0855 12/20/19 1820 12/21/19 0251 12/21/19 1411 12/22/19 0309  PROCALCITON  --  <0.10 0.21  --   --   WBC 6.1  --  24.6* 12.0* 10.5    Liver Function Tests: Recent Labs  Lab 12/20/19 0855 12/21/19 0251 12/22/19 0309  AST 31 47* 38  ALT 20 27 23   ALKPHOS 132* 99 72  BILITOT 1.1 1.0 1.5*  PROT 5.4* 4.7* 4.3*  ALBUMIN 2.5* 2.7* 2.7*   No results for input(s): LIPASE, AMYLASE in the last 168 hours. No results for input(s): AMMONIA in  the last 168 hours.  ABG No results found for: PHART, PCO2ART, PO2ART, HCO3, TCO2, ACIDBASEDEF, O2SAT   Coagulation Profile: No results for input(s): INR, PROTIME in the last 168 hours.  Cardiac Enzymes: No results for input(s): CKTOTAL, CKMB, CKMBINDEX, TROPONINI in the last 168 hours.  HbA1C: No results found for: HGBA1C  CBG: No results for input(s): GLUCAP in the last 168 hours.   Critical care time: n/a     Roselie Awkward, MD [facility] Pager: [phone] Cell: [phone] If no response, call [phone]

## 2019-12-22 NOTE — Progress Notes (Signed)
PROGRESS NOTE    Lee Wang  EZM:[id number] DOB: 1945-08-30 DOA: 12/13/2019 PCP: Patient, No Pcp Per     Brief Narrative:  74 year old WM PMHx Etoh abuse, BPH, prostate cancer , tobacco abuse, HLD and HTN   who had a witnessed mechanical fall after missing a step on 11/19/2019 subsequently imaging studies demonstrated fracture dislocation of the ankle and acute fracture (avulsion) through the base of the fifth metatarsal, he underwent Closed reduction and splinting of bimalleolar right ankle fractureon 11/20/19, on 11/30/2019 patient underwent-Removal of pre-existing plate and screws from the fibula with revision ORIF of the fibula with a locking plate. ORIF of the vertical shear fracture of the medial malleolus -On 12/14/2019 patient was found to have failure of hardware with essentially conversion of his close fractures and open fracture, on 12/14/19 patient underwent Removal of hardware from right ankle,irrigation and debridement,unsuccessful attempt at placement of external fixation and splint application Transferred to [place] from [facility] for definitive care .  Currently plan is treat hyponatremia.  Right tibiocalcaneal fusion tomorrow.   Subjective: 11/12 afebrile overnight A/O x2 (does not know where, when).  Cannot recall if he received Covid vaccination   Assessment & Plan: Covid vaccination;   Principal Problem:   Closed right ankle fracture, sequela Active Problems:   Alcohol abuse   HTN (hypertension)   Depression with anxiety   Prostate CA (HCC)   BPH (benign prostatic hyperplasia)   Hyponatremia   Alcohol dependence ([place])   Closed right ankle fracture   Tobacco abuse   Rupture of operation wound   Postprocedural hypotension   Shock ([place])   Acute metabolic encephalopathy/ Hypoosmolar Hyponatremia.   -Multifactorial EtOH abuse, poor p.o. intake. -Normal saline 43ml/hr -Monitor closely for further mental status changes   -Monitor closely sodium level -Hold all medication which will lower sodium level. -Lexapro (hold) -NaCl tablets 2 g TID Lab Results  Component Value Date   NA 132 (L) 12/22/2019   NA 129 (L) 12/21/2019   NA 126 (L) 12/20/2019   NA 127 (L) 12/20/2019   NA 127 (L) 12/19/2019    EtOH abuse -1 mg daily -Thiamine 100 mg daily -Seizure precaution  RIGHT ankle fracture with failed hardware. Presented with an open wound with failed hardware on 12/14/2019. Underwent removal of the hardware, irrigation and debridement as well as unsuccessful attempt at placement of external fixation on 12/14/2019. Patient was on IV antibiotics. Patient is currently transferred to [facility] for ankle fusion by Dr. Sharol Given tentatively scheduled for Wednesday, 12/20/2019 -.11/10  RIGHT TIBIOCALCANEAL FUSION -11/11 per orthopedic surgery STRICT nonweightbearing RIGHT lower extremity  Hypotension/Essential HTN -HTN currently not a factor -Per PACU staff patient receiving Phenyl ephedrine during surgery and unable to titrate patient off without dropping BP. -11/10 Albumin 12.5 g + normal saline -Continue Phenyl ephedrine, continue to attempt to wean patient off in the ICU.  Currently at 80 mcg/min -Map goal> 65 -11/11 Phenylephedrine 70 mcg/min -11/11 albumin 50 g + normal saline 51ml/hr -11/12 patient remains borderline hypotensive increase Midodrine 10 mg TID  Acute blood loss anemia/acute on chronic iron deficiency anemia. -Patient has had multiple surgeries in the past 30 days secondary to fractured ankle  -11/11 transfuse 2 unit PRBC -11/11 ADDENDUM; possible blood transfusion reaction paged by [name] who states 1 hour post second unit of PRBC patient has red welts on his legs buttocks arms that are pruritic.  Negative airway compromise. -Benadryl IV 12.5 mg x 1.  For  possible blood transfusion reaction -11/12 transfuse 1 unit PRBC; pretreat Benadryl IV 12.5 mg x 1 Lab Results  Component  Value Date   HGB 7.1 (L) 12/22/2019   HGB 7.7 (L) 12/21/2019   HGB 6.6 (LL) 12/21/2019   HGB 8.2 (L) 12/20/2019   HGB 8.3 (L) 12/20/2019  -Transfuse for hemoglobin<7  Blood transfusion reaction? -On 11/11 post 2 units PRBC patient developed welts that are pruritic responded to IV Benadryl. -In the future pretreat patient with Benadryl prior to transfusion  Tobacco abuse. -Holding off on nicotine patch due to confusion.  Monitor.  Depression and anxiety. -No suicidal ideation. -No significant anxiety as well. -Lexapro 10 mg daily (hold).  Secondary to hyponatremia  Incidentally RSV positive. -Continue droplet precaution.  No respiratory symptoms for now.  Hypomagnesmia -Magnesium goal> 2     DVT prophylaxis: 11/12 we will continue to hold anticoagulation until patient's anemia stabilizes.   Code Status: Full Family Communication:  Status is: Inpatient    Dispo: The patient is from: Home              Anticipated d/c is to: SNF              Anticipated d/c date is:??              Patient currently unstable      Consultants:  Orthopedic surgery Dr. Sharol Given   Procedures/Significant Events:  11/10  RIGHT TIBIOCALCANEAL FUSION    I have personally reviewed and interpreted all radiology studies and my findings are as above.  VENTILATOR SETTINGS: Room air 11/12 SPO2 98%    Cultures 11/3 SARS coronavirus negative 11/3 influenza A/B negative 11/3 positive RSV   Antimicrobials: Anti-infectives (From admission, onward)   Start     Ordered Stop   12/20/19 0815  ceFAZolin (ANCEF) IVPB 2g/100 mL premix        12/20/19 0722 12/20/19 1255   12/15/19 0900  cefTRIAXone (ROCEPHIN) 1 g in sodium chloride 0.9 % 100 mL IVPB        12/15/19 0803 12/18/19 0736   12/14/19 1400  ceFAZolin (ANCEF) IVPB 2g/100 mL premix        12/14/19 1158 12/15/19 2049   12/14/19 0600  ceFAZolin (ANCEF) IVPB 2g/100 mL premix        12/13/19 1316 12/14/19 0807   12/13/19 1415   ceFAZolin (ANCEF) IVPB 2g/100 mL premix        12/13/19 1315 12/14/19 5366       Devices    LINES / TUBES:      Continuous Infusions: . sodium chloride 75 mL/hr at 12/22/19 0600  . sodium chloride Stopped (12/21/19 1844)  . phenylephrine (NEO-SYNEPHRINE) Adult infusion 20 mcg/min (12/22/19 0600)     Objective: Vitals:   12/22/19 0700 12/22/19 0715 12/22/19 0730 12/22/19 0745  BP: (!) 103/53 (!) 108/55 (!) 95/57 (!) 116/57  Pulse: 65 73 63 77  Resp: (!) 9 (!) 21 12 (!) 22  Temp:      TempSrc:      SpO2: 100% 100% 100% 98%  Weight:      Height:        Intake/Output Summary (Last 24 hours) at 12/22/2019 0813 Last data filed at 12/22/2019 0600 Gross per 24 hour  Intake 6229.37 ml  Output 1115 ml  Net 5114.37 ml   Filed Weights   12/13/19 1302 12/17/19 1248 12/20/19 1106  Weight: 56.2 kg 64.3 kg 64.3 kg    Physical Exam:  General: A/O x2 (  does not know where, when), No acute respiratory distress Eyes: negative scleral hemorrhage, negative anisocoria, negative icterus ENT: Negative Runny nose, negative gingival bleeding, Neck:  Negative scars, masses, torticollis, lymphadenopathy, JVD Lungs: Clear to auscultation bilaterally without wheezes or crackles Cardiovascular: Regular rate and rhythm without murmur gallop or rub normal S1 and S2 Abdomen: negative abdominal pain, nondistended, positive soft, bowel sounds, no rebound, no ascites, no appreciable mass Extremities: RIGHT lower extremity with wound VAC in place, appears not to be draining any new fluid.  Fluid in reservoir appears to be old. Skin: Negative rashes, lesions, ulcers Psychiatric:  Negative depression, negative anxiety, negative fatigue, negative mania  Central nervous system:  Cranial nerves II through XII intact, tongue/uvula midline, all extremities muscle strength 5/5, sensation intact throughout,  negative dysarthria, negative expressive aphasia, negative receptive aphasia.   .     Data  Reviewed: Care during the described time interval was provided by me .  I have reviewed this patient's available data, including medical history, events of note, physical examination, and all test results as part of my evaluation.  CBC: Recent Labs  Lab 12/20/19 0307 12/20/19 0855 12/21/19 0251 12/21/19 1411 12/22/19 0309  WBC 6.9 6.1 24.6* 12.0* 10.5  NEUTROABS  --  4.0 21.5*  --  7.8*  HGB 8.3* 8.2* 6.6* 7.7* 7.1*  HCT 24.5* 24.1* 19.3* 22.6* 21.1*  MCV 95.7 96.0 99.0 93.8 94.6  PLT 310 302 351 266 267   Basic Metabolic Panel: Recent Labs  Lab 12/19/19 2157 12/20/19 0307 12/20/19 0855 12/21/19 0251 12/22/19 0309  NA 127* 127* 126* 129* 132*  K 4.2 3.9 4.1 5.1 3.7  CL 95* 96* 96* 99 105  CO2 22 24 25  19* 19*  GLUCOSE 99 90 88 94 95  BUN 6* <5* <5* 12 12  CREATININE 0.58* 0.54* 0.52* 0.94 0.68  CALCIUM 8.7* 8.7* 8.7* 8.4* 8.2*  MG  --   --  1.7 1.6* 1.9  PHOS  --   --  3.4 4.9* 2.4*   GFR: Estimated Creatinine Clearance: 73.7 mL/min (by C-G formula based on SCr of 0.68 mg/dL). Liver Function Tests: Recent Labs  Lab 12/20/19 0855 12/21/19 0251 12/22/19 0309  AST 31 47* 38  ALT 20 27 23   ALKPHOS 132* 99 72  BILITOT 1.1 1.0 1.5*  PROT 5.4* 4.7* 4.3*  ALBUMIN 2.5* 2.7* 2.7*   No results for input(s): LIPASE, AMYLASE in the last 168 hours. No results for input(s): AMMONIA in the last 168 hours. Coagulation Profile: No results for input(s): INR, PROTIME in the last 168 hours. Cardiac Enzymes: No results for input(s): CKTOTAL, CKMB, CKMBINDEX, TROPONINI in the last 168 hours. BNP (last 3 results) No results for input(s): PROBNP in the last 8760 hours. HbA1C: No results for input(s): HGBA1C in the last 72 hours. CBG: No results for input(s): GLUCAP in the last 168 hours. Lipid Profile: No results for input(s): CHOL, HDL, LDLCALC, TRIG, CHOLHDL, LDLDIRECT in the last 72 hours. Thyroid Function Tests: No results for input(s): TSH, T4TOTAL, FREET4, T3FREE,  THYROIDAB in the last 72 hours. Anemia Panel: No results for input(s): VITAMINB12, FOLATE, FERRITIN, TIBC, IRON, RETICCTPCT in the last 72 hours. Sepsis Labs: Recent Labs  Lab 12/20/19 1820 12/21/19 0251  PROCALCITON <0.10 0.21    Recent Results (from the past 240 hour(s))  Respiratory Panel by RT PCR (Flu A&B, Covid) - Nasopharyngeal Swab     Status: None   Collection Time: 12/13/19  1:15 PM   Specimen: Nasopharyngeal Swab  Result Value Ref Range Status   SARS Coronavirus 2 by RT PCR NEGATIVE NEGATIVE Final    Comment: (NOTE) SARS-CoV-2 target nucleic acids are NOT DETECTED.  The SARS-CoV-2 RNA is generally detectable in upper respiratoy specimens during the acute phase of infection. The lowest concentration of SARS-CoV-2 viral copies this assay can detect is 131 copies/mL. A negative result does not preclude SARS-Cov-2 infection and should not be used as the sole basis for treatment or other patient management decisions. A negative result may occur with  improper specimen collection/handling, submission of specimen other than nasopharyngeal swab, presence of viral mutation(s) within the areas targeted by this assay, and inadequate number of viral copies (<131 copies/mL). A negative result must be combined with clinical observations, patient history, and epidemiological information. The expected result is Negative.  Fact Sheet for Patients:  PinkCheek.be  Fact Sheet for Healthcare Providers:  GravelBags.it  This test is no t yet approved or cleared by the [place] FDA and  has been authorized for detection and/or diagnosis of SARS-CoV-2 by FDA under an Emergency Use Authorization (EUA). This EUA will remain  in effect (meaning this test can be used) for the duration of the COVID-19 declaration under Section 564(b)(1) of the Act, 21 U.S.C. section 360bbb-3(b)(1), unless the authorization is terminated  or revoked sooner.     Influenza A by PCR NEGATIVE NEGATIVE Final   Influenza B by PCR NEGATIVE NEGATIVE Final    Comment: (NOTE) The Xpert Xpress SARS-CoV-2/FLU/RSV assay is intended as an aid in  the diagnosis of influenza from Nasopharyngeal swab specimens and  should not be used as a sole basis for treatment. Nasal washings and  aspirates are unacceptable for Xpert Xpress SARS-CoV-2/FLU/RSV  testing.  Fact Sheet for Patients: PinkCheek.be  Fact Sheet for Healthcare Providers: GravelBags.it  This test is not yet approved or cleared by the [place] FDA and  has been authorized for detection and/or diagnosis of SARS-CoV-2 by  FDA under an Emergency Use Authorization (EUA). This EUA will remain  in effect (meaning this test can be used) for the duration of the  Covid-19 declaration under Section 564(b)(1) of the Act, 21  U.S.C. section 360bbb-3(b)(1), unless the authorization is  terminated or revoked. Performed at [facility], [address] [place], [place] [address]   Resp Panel by RT PCR (RSV, Flu A&B, Covid) -     Status: Abnormal   Collection Time: 12/13/19  1:15 PM  Result Value Ref Range Status   SARS Coronavirus 2 by RT PCR NEGATIVE NEGATIVE Final    Comment: (NOTE) SARS-CoV-2 target nucleic acids are NOT DETECTED.  The SARS-CoV-2 RNA is generally detectable in upper respiratoy specimens during the acute phase of infection. The lowest concentration of SARS-CoV-2 viral copies this assay can detect is 131 copies/mL. A negative result does not preclude SARS-Cov-2 infection and should not be used as the sole basis for treatment or other patient management decisions. A negative result may occur with  improper specimen collection/handling, submission of specimen other than nasopharyngeal swab, presence of viral mutation(s) within the areas targeted by this assay, and inadequate number of viral copies (<131  copies/mL). A negative result must be combined with clinical observations, patient history, and epidemiological information. The expected result is Negative.  Fact Sheet for Patients:  PinkCheek.be  Fact Sheet for Healthcare Providers:  GravelBags.it  This test is no t yet approved or cleared by the [place] FDA and  has been authorized for detection and/or diagnosis of  SARS-CoV-2 by FDA under an Emergency Use Authorization (EUA). This EUA will remain  in effect (meaning this test can be used) for the duration of the COVID-19 declaration under Section 564(b)(1) of the Act, 21 U.S.C. section 360bbb-3(b)(1), unless the authorization is terminated or revoked sooner.     Influenza A by PCR NEGATIVE NEGATIVE Final   Influenza B by PCR NEGATIVE NEGATIVE Final    Comment: (NOTE) The Xpert Xpress SARS-CoV-2/FLU/RSV assay is intended as an aid in  the diagnosis of influenza from Nasopharyngeal swab specimens and  should not be used as a sole basis for treatment. Nasal washings and  aspirates are unacceptable for Xpert Xpress SARS-CoV-2/FLU/RSV  testing.  Fact Sheet for Patients: PinkCheek.be  Fact Sheet for Healthcare Providers: GravelBags.it  This test is not yet approved or cleared by the [place] FDA and  has been authorized for detection and/or diagnosis of SARS-CoV-2 by  FDA under an Emergency Use Authorization (EUA). This EUA will remain  in effect (meaning this test can be used) for the duration of the  Covid-19 declaration under Section 564(b)(1) of the Act, 21  U.S.C. section 360bbb-3(b)(1), unless the authorization is  terminated or revoked.    Respiratory Syncytial Virus by PCR POSITIVE (A) NEGATIVE Final    Comment: (NOTE) Fact Sheet for Patients: PinkCheek.be  Fact Sheet for Healthcare  Providers: GravelBags.it  This test is not yet approved or cleared by the [place] FDA and  has been authorized for detection and/or diagnosis of SARS-CoV-2 by  FDA under an Emergency Use Authorization (EUA). This EUA will remain  in effect (meaning this test can be used) for the duration of the  COVID-19 declaration under Section 564(b)(1) of the Act, 21 U.S.C.  section 360bbb-3(b)(1), unless the authorization is terminated or  revoked. Performed at [facility], [address], [place], [place] [address]   Surgical pcr screen     Status: None   Collection Time: 12/13/19  3:58 PM   Specimen: Nasal Mucosa; Nasal Swab  Result Value Ref Range Status   MRSA, PCR NEGATIVE NEGATIVE Final   Staphylococcus aureus NEGATIVE NEGATIVE Final    Comment: (NOTE) The Xpert SA Assay (FDA approved for NASAL specimens in patients 65 years of age and older), is one component of a comprehensive surveillance program. It is not intended to diagnose infection nor to guide or monitor treatment. Performed at [facility], [address], [place], [place] [address]   MRSA PCR Screening     Status: None   Collection Time: 12/20/19  8:13 PM   Specimen: Nasal Mucosa; Nasopharyngeal  Result Value Ref Range Status   MRSA by PCR NEGATIVE NEGATIVE Final    Comment:        The GeneXpert MRSA Assay (FDA approved for NASAL specimens only), is one component of a comprehensive MRSA colonization surveillance program. It is not intended to diagnose MRSA infection nor to guide or monitor treatment for MRSA infections. Performed at [facility], [place] [address], [place], [place] [address]   Culture, blood (routine x 2)     Status: None (Preliminary result)   Collection Time: 12/20/19  9:23 PM   Specimen: BLOOD RIGHT HAND  Result Value Ref Range Status   Specimen Description BLOOD RIGHT HAND  Final   Special Requests   Final    BOTTLES DRAWN AEROBIC ONLY Blood Culture  adequate volume   Culture   Final    NO GROWTH < 12 HOURS Performed at [facility], [place] [address] [place], [place] [address]    Report Status PENDING  Incomplete  Culture, blood (routine x 2)     Status: None (Preliminary result)   Collection Time: 12/20/19  9:33 PM   Specimen: BLOOD LEFT HAND  Result Value Ref Range Status   Specimen Description BLOOD LEFT HAND  Final   Special Requests   Final    BOTTLES DRAWN AEROBIC ONLY Blood Culture results may not be optimal due to an inadequate volume of blood received in culture bottles   Culture   Final    NO GROWTH < 12 HOURS Performed at [facility], [place] [address], [place],  [address]    Report Status PENDING  Incomplete         Radiology Studies: US Abdomen Complete  Result Date: 12/21/2019 CLINICAL DATA:  Cirrhosis EXAM: ABDOMEN ULTRASOUND COMPLETE COMPARISON:  04/11/2013 FINDINGS: Gallbladder: The gallbladder is partially decompressed resulting in artifactual mild wall thickening. Trace pericholecystic fluid is present, nonspecific. No intraluminal stones or sludge is identified. The gallbladder is not distended. The sonographic Percell Miller sign is reportedly negative. Common bile duct: Diameter: 3 mm Liver: The liver demonstrates a progressively nodular contour and increasing heterogeneity of the parenchymal echotexture in keeping progressive changes of cirrhosis. No focal intrahepatic masses identified. No intrahepatic biliary ductal dilation. Portal vein is patent on color Doppler imaging with normal direction of blood flow towards the liver. IVC: No abnormality visualized. Pancreas: Visualized portion unremarkable. Spleen: Size and appearance within normal limits. Right Kidney: Length: 9.9 cm. Echogenicity within normal limits. No mass or hydronephrosis visualized. Left Kidney: Length: 10 cm. Echogenicity within normal limits. No mass or hydronephrosis visualized. Abdominal aorta: No aneurysm visualized. Other  findings: None. IMPRESSION: Progressive parenchymal changes in keeping with cirrhosis. No focal liver mass identified. Trace pericholecystic fluid is nonspecific and may represent trace perihepatic ascites in the setting of cirrhosis. Electronically Signed   By: Fidela Salisbury MD   On: 12/21/2019 04:06        Scheduled Meds: . sodium chloride   Intravenous Once  . sodium chloride   Intravenous Once  . sodium chloride   Intravenous Once  . aspirin EC  81 mg Oral BID  . atorvastatin  40 mg Oral Daily  . Chlorhexidine Gluconate Cloth  6 each Topical Daily  . docusate sodium  100 mg Oral BID  . feeding supplement  237 mL Oral BID BM  . ferrous sulfate  325 mg Oral BID WC  . folic acid  1 mg Oral Daily  . midodrine  5 mg Oral TID WC  . multivitamin with minerals  1 tablet Oral Daily  . sodium chloride  2 g Oral TID  . thiamine  100 mg Oral Daily   Or  . thiamine  100 mg Intravenous Daily  . vitamin B-12  1,000 mcg Oral Daily   Continuous Infusions: . sodium chloride 75 mL/hr at 12/22/19 0600  . sodium chloride Stopped (12/21/19 1844)  . phenylephrine (NEO-SYNEPHRINE) Adult infusion 20 mcg/min (12/22/19 0600)     LOS: 9 days    Time spent:40 min    Kristof Nadeem, Geraldo Docker, MD [facility] Pager [phone]  If 7PM-7AM, please contact night-coverage [email or web address] Password [facility] 12/22/2019, 8:13 AM

## 2019-12-23 DIAGNOSIS — S82891G Other fracture of right lower leg, subsequent encounter for closed fracture with delayed healing: Secondary | ICD-10-CM | POA: Diagnosis not present

## 2019-12-23 DIAGNOSIS — S82891D Other fracture of right lower leg, subsequent encounter for closed fracture with routine healing: Secondary | ICD-10-CM

## 2019-12-23 DIAGNOSIS — F1010 Alcohol abuse, uncomplicated: Secondary | ICD-10-CM | POA: Diagnosis not present

## 2019-12-23 DIAGNOSIS — S82891S Other fracture of right lower leg, sequela: Secondary | ICD-10-CM | POA: Diagnosis not present

## 2019-12-23 LAB — TYPE AND SCREEN
ABO/RH(D): O NEG
Antibody Screen: NEGATIVE
Unit division: 0
Unit division: 0
Unit division: 0

## 2019-12-23 LAB — CBC WITH DIFFERENTIAL/PLATELET
Abs Immature Granulocytes: 0.03 10*3/uL (ref 0.00–0.07)
Basophils Absolute: 0 10*3/uL (ref 0.0–0.1)
Basophils Relative: 0 %
Eosinophils Absolute: 0.1 10*3/uL (ref 0.0–0.5)
Eosinophils Relative: 1 %
HCT: 24.6 % — ABNORMAL LOW (ref 39.0–52.0)
Hemoglobin: 8.3 g/dL — ABNORMAL LOW (ref 13.0–17.0)
Immature Granulocytes: 0 %
Lymphocytes Relative: 12 %
Lymphs Abs: 0.8 10*3/uL (ref 0.7–4.0)
MCH: 31.4 pg (ref 26.0–34.0)
MCHC: 33.7 g/dL (ref 30.0–36.0)
MCV: 93.2 fL (ref 80.0–100.0)
Monocytes Absolute: 0.5 10*3/uL (ref 0.1–1.0)
Monocytes Relative: 8 %
Neutro Abs: 5.3 10*3/uL (ref 1.7–7.7)
Neutrophils Relative %: 79 %
Platelets: 230 10*3/uL (ref 150–400)
RBC: 2.64 MIL/uL — ABNORMAL LOW (ref 4.22–5.81)
RDW: 16 % — ABNORMAL HIGH (ref 11.5–15.5)
WBC: 6.8 10*3/uL (ref 4.0–10.5)
nRBC: 0 % (ref 0.0–0.2)

## 2019-12-23 LAB — COMPREHENSIVE METABOLIC PANEL
ALT: 20 U/L (ref 0–44)
AST: 33 U/L (ref 15–41)
Albumin: 2.5 g/dL — ABNORMAL LOW (ref 3.5–5.0)
Alkaline Phosphatase: 84 U/L (ref 38–126)
Anion gap: 7 (ref 5–15)
BUN: 6 mg/dL — ABNORMAL LOW (ref 8–23)
CO2: 22 mmol/L (ref 22–32)
Calcium: 8.2 mg/dL — ABNORMAL LOW (ref 8.9–10.3)
Chloride: 102 mmol/L (ref 98–111)
Creatinine, Ser: 0.59 mg/dL — ABNORMAL LOW (ref 0.61–1.24)
GFR, Estimated: >60 mL/min (ref >60)
Glucose, Bld: 92 mg/dL (ref 70–99)
Potassium: 3.5 mmol/L (ref 3.5–5.1)
Sodium: 131 mmol/L — ABNORMAL LOW (ref 135–145)
Total Bilirubin: 1.2 mg/dL (ref 0.3–1.2)
Total Protein: 4.3 g/dL — ABNORMAL LOW (ref 6.5–8.1)

## 2019-12-23 LAB — MAGNESIUM: Magnesium: 1.6 mg/dL — ABNORMAL LOW (ref 1.7–2.4)

## 2019-12-23 LAB — BPAM RBC
Blood Product Expiration Date: 202111182359
Blood Product Expiration Date: 202111302359
Blood Product Expiration Date: 202112012359
ISSUE DATE / TIME: 202111110759
ISSUE DATE / TIME: 202111111015
ISSUE DATE / TIME: 202111120916
Unit Type and Rh: 9500
Unit Type and Rh: 9500
Unit Type and Rh: 9500

## 2019-12-23 LAB — PHOSPHORUS: Phosphorus: 2.4 mg/dL — ABNORMAL LOW (ref 2.5–4.6)

## 2019-12-23 MED ORDER — MAGNESIUM SULFATE 50 % IJ SOLN
3.0000 g | Freq: Once | INTRAVENOUS | Status: AC
  Administered 2019-12-23: 3 g via INTRAVENOUS
  Filled 2019-12-23: qty 6

## 2019-12-23 MED ORDER — POTASSIUM PHOSPHATES 15 MMOLE/5ML IV SOLN
20.0000 mmol | Freq: Once | INTRAVENOUS | Status: AC
  Administered 2019-12-23: 20 mmol via INTRAVENOUS
  Filled 2019-12-23: qty 6.67

## 2019-12-23 MED ORDER — HEPARIN SODIUM (PORCINE) 5000 UNIT/ML IJ SOLN
5000.0000 [IU] | Freq: Three times a day (TID) | INTRAMUSCULAR | Status: DC
  Administered 2019-12-23 – 2019-12-26 (×9): 5000 [IU] via SUBCUTANEOUS
  Filled 2019-12-23 (×9): qty 1

## 2019-12-23 MED ORDER — ACETAMINOPHEN 325 MG PO TABS
650.0000 mg | ORAL_TABLET | Freq: Four times a day (QID) | ORAL | Status: DC | PRN
  Administered 2019-12-23 – 2019-12-25 (×3): 650 mg via ORAL
  Filled 2019-12-23 (×2): qty 2

## 2019-12-23 NOTE — Progress Notes (Signed)
Patient ID: Lee Wang, male   DOB: [birth year], 74 y.o.   MRN: [id number] Patient was seen in follow-up for tibial calcaneal fusion.  The dressing is clean and dry there is a total of 425 cc in the wound VAC canister there is a good suction fit.  Patient has good capillary refill in his toes.  Discussed the importance of wearing the fracture boot whenever he is moved from the bed or gait training.

## 2019-12-23 NOTE — Progress Notes (Signed)
PROGRESS NOTE    Lee Wang  YJE:[id number] DOB: [birth year] DOA: 12/13/2019 PCP: Patient, No Pcp Per     Brief Narrative:  74 year old WM PMHx Etoh abuse, BPH, prostate cancer , tobacco abuse, HLD and HTN   who had a witnessed mechanical fall after missing a step on 11/19/2019 subsequently imaging studies demonstrated fracture dislocation of the ankle and acute fracture (avulsion) through the base of the fifth metatarsal, he underwent Closed reduction and splinting of bimalleolar right ankle fractureon 11/20/19, on 11/30/2019 patient underwent-Removal of pre-existing plate and screws from the fibula with revision ORIF of the fibula with a locking plate. ORIF of the vertical shear fracture of the medial malleolus -On 12/14/2019 patient was found to have failure of hardware with essentially conversion of his close fractures and open fracture, on 12/14/19 patient underwent Removal of hardware from right ankle,irrigation and debridement,unsuccessful attempt at placement of external fixation and splint application Transferred to [place] from [facility] for definitive care .  Currently plan is treat hyponatremia.  Right tibiocalcaneal fusion tomorrow.   Subjective: 11/13 afebrile overnight A/O x1 (does not know where, when, why).  Patient does know that he is in the hospital but believes he is in [place], cooperative.   Assessment & Plan: Covid vaccination;   Principal Problem:   Closed right ankle fracture, sequela Active Problems:   Alcohol abuse   HTN (hypertension)   Depression with anxiety   Prostate CA (HCC)   BPH (benign prostatic hyperplasia)   Hyponatremia   Alcohol dependence (HCC)   Closed right ankle fracture   Tobacco abuse   Rupture of operation wound   Postprocedural hypotension   Shock ([place])   Acute metabolic encephalopathy/ Hypoosmolar Hyponatremia.   -Multifactorial EtOH abuse, poor p.o. intake. -Normal saline  38ml/hr -Monitor closely for further mental status changes  -Monitor closely sodium level -Hold all medication which will lower sodium level. -Lexapro (hold) -NaCl tablets 2 g TID Lab Results  Component Value Date   NA 131 (L) 12/23/2019   NA 132 (L) 12/22/2019   NA 129 (L) 12/21/2019   NA 126 (L) 12/20/2019   NA 127 (L) 12/20/2019    EtOH abuse -Folic acid 1 mg daily -Thiamine 100 mg daily -Seizure precaution  RIGHT ankle fracture with failed hardware. Presented with an open wound with failed hardware on 12/14/2019. Underwent removal of the hardware, irrigation and debridement as well as unsuccessful attempt at placement of external fixation on 12/14/2019. Patient was on IV antibiotics. Patient is currently transferred to [facility] for ankle fusion by Dr. Sharol Given tentatively scheduled for Wednesday, 12/20/2019 -.11/10  RIGHT TIBIOCALCANEAL FUSION -11/11 per orthopedic surgery STRICT nonweightbearing RIGHT lower extremity  Hypotension/Essential HTN -HTN currently not a factor -Per PACU staff patient receiving Phenyl ephedrine during surgery and unable to titrate patient off without dropping BP. -11/10 Albumin 12.5 g + normal saline -Continue Phenyl ephedrine, continue to attempt to wean patient off in the ICU.  Currently at 80 mcg/min -Map goal> 65 -11/11 Phenylephedrine 70 mcg/min -11/11 albumin 50 g + normal saline 49ml/hr -11/12 patient remains borderline hypotensive increase Midodrine 10 mg TID -11/13 BP improved with fluids and midodrine  Acute blood loss anemia/acute on chronic iron deficiency anemia. -Patient has had multiple surgeries in the past 30 days secondary to fractured ankle  -11/11 transfuse 2 unit PRBC -11/11 ADDENDUM; possible blood transfusion reaction paged by [name] who states 1 hour post second unit of PRBC patient has  red welts on his legs buttocks arms that are pruritic.  Negative airway compromise. -Benadryl IV 12.5 mg x 1.  For possible  blood transfusion reaction -11/12 transfuse 1 unit PRBC; pretreat Benadryl IV 12.5 mg x 1 Lab Results  Component Value Date   HGB 8.3 (L) 12/23/2019   HGB 8.2 (L) 12/22/2019   HGB 7.1 (L) 12/22/2019   HGB 7.7 (L) 12/21/2019   HGB 6.6 (LL) 12/21/2019  -Transfuse for hemoglobin<7  Blood transfusion reaction? -On 11/11 post 2 units PRBC patient developed welts that are pruritic responded to IV Benadryl. -In the future pretreat patient with Benadryl prior to transfusion  Tobacco abuse. -Holding off on nicotine patch due to confusion.  Monitor.  Depression and anxiety. -No suicidal ideation. -No significant anxiety as well. -Lexapro 10 mg daily (hold).  Secondary to hyponatremia  Incidentally RSV positive. -Continue droplet precaution.  No respiratory symptoms for now.  Hypokalemia -Potassium goal> 4 -11/13 K-Phos IV 20 mmol  Hypophosphatemia -Phosphorus goal> 2.5 -See hypokalemia  Hypomagnesmia -Magnesium goal> 2 -11/13 magnesium 3 g    DVT prophylaxis: 11/13 anemia stable.  Start Heparin subcu   Code Status: Full Family Communication:  Status is: Inpatient    Dispo: The patient is from: Home              Anticipated d/c is to: SNF              Anticipated d/c date is:??              Patient currently unstable      Consultants:  Orthopedic surgery Dr. Sharol Given   Procedures/Significant Events:  11/10  RIGHT TIBIOCALCANEAL FUSION    I have personally reviewed and interpreted all radiology studies and my findings are as above.  VENTILATOR SETTINGS: Room air 11/13 SPO2 100%    Cultures 11/3 SARS coronavirus negative 11/3 influenza A/B negative 11/3 positive RSV   Antimicrobials: Anti-infectives (From admission, onward)   Start     Ordered Stop   12/20/19 0815  ceFAZolin (ANCEF) IVPB 2g/100 mL premix        12/20/19 0722 12/20/19 1255   12/15/19 0900  cefTRIAXone (ROCEPHIN) 1 g in sodium chloride 0.9 % 100 mL IVPB        12/15/19 0803  12/18/19 0736   12/14/19 1400  ceFAZolin (ANCEF) IVPB 2g/100 mL premix        12/14/19 1158 12/15/19 2049   12/14/19 0600  ceFAZolin (ANCEF) IVPB 2g/100 mL premix        12/13/19 1316 12/14/19 0807   12/13/19 1415  ceFAZolin (ANCEF) IVPB 2g/100 mL premix        12/13/19 1315 12/14/19 5027       Devices    LINES / TUBES:      Continuous Infusions: . sodium chloride Stopped (12/23/19 0248)  . sodium chloride Stopped (12/21/19 1844)  . phenylephrine (NEO-SYNEPHRINE) Adult infusion Stopped (12/22/19 0622)     Objective: Vitals:   12/23/19 0700 12/23/19 0800 12/23/19 0900 12/23/19 1000  BP: 115/77 127/64 140/82 (!) 131/57  Pulse:  61 63 69  Resp: 16 14 15 18   Temp:  99.2 F (37.3 C)    TempSrc:  Oral    SpO2:   100% 100%  Weight:      Height:        Intake/Output Summary (Last 24 hours) at 12/23/2019 1034 Last data filed at 12/23/2019 1000 Gross per 24 hour  Intake 1592.31 ml  Output 500 ml  Net 1092.31 ml   Filed Weights   12/13/19 1302 12/17/19 1248 12/20/19 1106  Weight: 56.2 kg 64.3 kg 64.3 kg   Physical Exam:  General: A/O x1 (does not know where, when, why).  Does know that he is in a hospital, No acute respiratory distress, cachectic Eyes: negative scleral hemorrhage, negative anisocoria, negative icterus ENT: Negative Runny nose, negative gingival bleeding, Neck:  Negative scars, masses, torticollis, lymphadenopathy, JVD Lungs: Clear to auscultation bilaterally without wheezes or crackles Cardiovascular: Regular rate and rhythm without murmur gallop or rub normal S1 and S2 Abdomen: negative abdominal pain, nondistended, positive soft, bowel sounds, no rebound, no ascites, no appreciable mass Extremities: lower extremity with wound VAC in place, appears not to be draining any new fluid.  Fluid in reservoir appears to be old. Skin: Negative rashes, lesions, ulcers Psychiatric:  Negative depression, negative anxiety, negative fatigue, negative mania   Central nervous system:  Cranial nerves II through XII intact, tongue/uvula midline, all extremities muscle strength 5/5, sensation intact throughout, negative dysarthria, negative expressive aphasia, negative receptive aphasia.    .     Data Reviewed: Care during the described time interval was provided by me .  I have reviewed this patient's available data, including medical history, events of note, physical examination, and all test results as part of my evaluation.  CBC: Recent Labs  Lab 12/20/19 0855 12/20/19 0855 12/21/19 0251 12/21/19 1411 12/22/19 0309 12/22/19 1457 12/23/19 0625  WBC 6.1  --  24.6* 12.0* 10.5  --  6.8  NEUTROABS 4.0  --  21.5*  --  7.8*  --  5.3  HGB 8.2*   < > 6.6* 7.7* 7.1* 8.2* 8.3*  HCT 24.1*   < > 19.3* 22.6* 21.1* 24.0* 24.6*  MCV 96.0  --  99.0 93.8 94.6  --  93.2  PLT 302  --  351 266 220  --  230   < > = values in this interval not displayed.   Basic Metabolic Panel: Recent Labs  Lab 12/20/19 0307 12/20/19 0855 12/21/19 0251 12/22/19 0309 12/23/19 0625  NA 127* 126* 129* 132* 131*  K 3.9 4.1 5.1 3.7 3.5  CL 96* 96* 99 105 102  CO2 24 25 19* 19* 22  GLUCOSE 90 88 94 95 92  BUN <5* <5* 12 12 6*  CREATININE 0.54* 0.52* 0.94 0.68 0.59*  CALCIUM 8.7* 8.7* 8.4* 8.2* 8.2*  MG  --  1.7 1.6* 1.9 1.6*  PHOS  --  3.4 4.9* 2.4* 2.4*   GFR: Estimated Creatinine Clearance: 73.7 mL/min (A) (by C-G formula based on SCr of 0.59 mg/dL (L)). Liver Function Tests: Recent Labs  Lab 12/20/19 0855 12/21/19 0251 12/22/19 0309 12/23/19 0625  AST 31 47* 38 33  ALT 20 27 23 20   ALKPHOS 132* 99 72 84  BILITOT 1.1 1.0 1.5* 1.2  PROT 5.4* 4.7* 4.3* 4.3*  ALBUMIN 2.5* 2.7* 2.7* 2.5*   No results for input(s): LIPASE, AMYLASE in the last 168 hours. No results for input(s): AMMONIA in the last 168 hours. Coagulation Profile: No results for input(s): INR, PROTIME in the last 168 hours. Cardiac Enzymes: No results for input(s): CKTOTAL, CKMB,  CKMBINDEX, TROPONINI in the last 168 hours. BNP (last 3 results) No results for input(s): PROBNP in the last 8760 hours. HbA1C: No results for input(s): HGBA1C in the last 72 hours. CBG: No results for input(s): GLUCAP in the last 168 hours. Lipid Profile: No results for input(s): CHOL, HDL, LDLCALC, TRIG, CHOLHDL, LDLDIRECT  in the last 72 hours. Thyroid Function Tests: No results for input(s): TSH, T4TOTAL, FREET4, T3FREE, THYROIDAB in the last 72 hours. Anemia Panel: No results for input(s): VITAMINB12, FOLATE, FERRITIN, TIBC, IRON, RETICCTPCT in the last 72 hours. Sepsis Labs: Recent Labs  Lab 12/20/19 1820 12/21/19 0251  PROCALCITON <0.10 0.21    Recent Results (from the past 240 hour(s))  Respiratory Panel by RT PCR (Flu A&B, Covid) - Nasopharyngeal Swab     Status: None   Collection Time: 12/13/19  1:15 PM   Specimen: Nasopharyngeal Swab  Result Value Ref Range Status   SARS Coronavirus 2 by RT PCR NEGATIVE NEGATIVE Final    Comment: (NOTE) SARS-CoV-2 target nucleic acids are NOT DETECTED.  The SARS-CoV-2 RNA is generally detectable in upper respiratoy specimens during the acute phase of infection. The lowest concentration of SARS-CoV-2 viral copies this assay can detect is 131 copies/mL. A negative result does not preclude SARS-Cov-2 infection and should not be used as the sole basis for treatment or other patient management decisions. A negative result may occur with  improper specimen collection/handling, submission of specimen other than nasopharyngeal swab, presence of viral mutation(s) within the areas targeted by this assay, and inadequate number of viral copies (<131 copies/mL). A negative result must be combined with clinical observations, patient history, and epidemiological information. The expected result is Negative.  Fact Sheet for Patients:  PinkCheek.be  Fact Sheet for Healthcare Providers:   GravelBags.it  This test is no t yet approved or cleared by the [place] FDA and  has been authorized for detection and/or diagnosis of SARS-CoV-2 by FDA under an Emergency Use Authorization (EUA). This EUA will remain  in effect (meaning this test can be used) for the duration of the COVID-19 declaration under Section 564(b)(1) of the Act, 21 U.S.C. section 360bbb-3(b)(1), unless the authorization is terminated or revoked sooner.     Influenza A by PCR NEGATIVE NEGATIVE Final   Influenza B by PCR NEGATIVE NEGATIVE Final    Comment: (NOTE) The Xpert Xpress SARS-CoV-2/FLU/RSV assay is intended as an aid in  the diagnosis of influenza from Nasopharyngeal swab specimens and  should not be used as a sole basis for treatment. Nasal washings and  aspirates are unacceptable for Xpert Xpress SARS-CoV-2/FLU/RSV  testing.  Fact Sheet for Patients: PinkCheek.be  Fact Sheet for Healthcare Providers: GravelBags.it  This test is not yet approved or cleared by the [place] FDA and  has been authorized for detection and/or diagnosis of SARS-CoV-2 by  FDA under an Emergency Use Authorization (EUA). This EUA will remain  in effect (meaning this test can be used) for the duration of the  Covid-19 declaration under Section 564(b)(1) of the Act, 21  U.S.C. section 360bbb-3(b)(1), unless the authorization is  terminated or revoked. Performed at [facility], [address] [place], [place] [address]   Resp Panel by RT PCR (RSV, Flu A&B, Covid) -     Status: Abnormal   Collection Time: 12/13/19  1:15 PM  Result Value Ref Range Status   SARS Coronavirus 2 by RT PCR NEGATIVE NEGATIVE Final    Comment: (NOTE) SARS-CoV-2 target nucleic acids are NOT DETECTED.  The SARS-CoV-2 RNA is generally detectable in upper respiratoy specimens during the acute phase of infection. The lowest concentration of  SARS-CoV-2 viral copies this assay can detect is 131 copies/mL. A negative result does not preclude SARS-Cov-2 infection and should not be used as the sole basis for treatment or other patient management decisions.  A negative result may occur with  improper specimen collection/handling, submission of specimen other than nasopharyngeal swab, presence of viral mutation(s) within the areas targeted by this assay, and inadequate number of viral copies (<131 copies/mL). A negative result must be combined with clinical observations, patient history, and epidemiological information. The expected result is Negative.  Fact Sheet for Patients:  PinkCheek.be  Fact Sheet for Healthcare Providers:  GravelBags.it  This test is no t yet approved or cleared by the [place] FDA and  has been authorized for detection and/or diagnosis of SARS-CoV-2 by FDA under an Emergency Use Authorization (EUA). This EUA will remain  in effect (meaning this test can be used) for the duration of the COVID-19 declaration under Section 564(b)(1) of the Act, 21 U.S.C. section 360bbb-3(b)(1), unless the authorization is terminated or revoked sooner.     Influenza A by PCR NEGATIVE NEGATIVE Final   Influenza B by PCR NEGATIVE NEGATIVE Final    Comment: (NOTE) The Xpert Xpress SARS-CoV-2/FLU/RSV assay is intended as an aid in  the diagnosis of influenza from Nasopharyngeal swab specimens and  should not be used as a sole basis for treatment. Nasal washings and  aspirates are unacceptable for Xpert Xpress SARS-CoV-2/FLU/RSV  testing.  Fact Sheet for Patients: PinkCheek.be  Fact Sheet for Healthcare Providers: GravelBags.it  This test is not yet approved or cleared by the [place] FDA and  has been authorized for detection and/or diagnosis of SARS-CoV-2 by  FDA under an Emergency Use  Authorization (EUA). This EUA will remain  in effect (meaning this test can be used) for the duration of the  Covid-19 declaration under Section 564(b)(1) of the Act, 21  U.S.C. section 360bbb-3(b)(1), unless the authorization is  terminated or revoked.    Respiratory Syncytial Virus by PCR POSITIVE (A) NEGATIVE Final    Comment: (NOTE) Fact Sheet for Patients: PinkCheek.be  Fact Sheet for Healthcare Providers: GravelBags.it  This test is not yet approved or cleared by the [place] FDA and  has been authorized for detection and/or diagnosis of SARS-CoV-2 by  FDA under an Emergency Use Authorization (EUA). This EUA will remain  in effect (meaning this test can be used) for the duration of the  COVID-19 declaration under Section 564(b)(1) of the Act, 21 U.S.C.  section 360bbb-3(b)(1), unless the authorization is terminated or  revoked. Performed at [facility], [address], [place], [place] [address]   Surgical pcr screen     Status: None   Collection Time: 12/13/19  3:58 PM   Specimen: Nasal Mucosa; Nasal Swab  Result Value Ref Range Status   MRSA, PCR NEGATIVE NEGATIVE Final   Staphylococcus aureus NEGATIVE NEGATIVE Final    Comment: (NOTE) The Xpert SA Assay (FDA approved for NASAL specimens in patients 73 years of age and older), is one component of a comprehensive surveillance program. It is not intended to diagnose infection nor to guide or monitor treatment. Performed at [facility], [address] [place], [place] [address]   MRSA PCR Screening     Status: None   Collection Time: 12/20/19  8:13 PM   Specimen: Nasal Mucosa; Nasopharyngeal  Result Value Ref Range Status   MRSA by PCR NEGATIVE NEGATIVE Final    Comment:        The GeneXpert MRSA Assay (FDA approved for NASAL specimens only), is one component of a comprehensive MRSA colonization surveillance program. It is not intended to diagnose  MRSA infection nor to guide or monitor treatment for  MRSA infections. Performed at [facility], [place] [address], [place], [place] [address]   Culture, blood (routine x 2)     Status: None (Preliminary result)   Collection Time: 12/20/19  9:23 PM   Specimen: BLOOD RIGHT HAND  Result Value Ref Range Status   Specimen Description BLOOD RIGHT HAND  Final   Special Requests   Final    BOTTLES DRAWN AEROBIC ONLY Blood Culture adequate volume   Culture   Final    NO GROWTH 3 DAYS Performed at [facility], [place] [address], [place], [place] [address]    Report Status PENDING  Incomplete  Culture, blood (routine x 2)     Status: None (Preliminary result)   Collection Time: 12/20/19  9:33 PM   Specimen: BLOOD LEFT HAND  Result Value Ref Range Status   Specimen Description BLOOD LEFT HAND  Final   Special Requests   Final    BOTTLES DRAWN AEROBIC ONLY Blood Culture results may not be optimal due to an inadequate volume of blood received in culture bottles   Culture   Final    NO GROWTH 3 DAYS Performed at [facility], [place] [address], [place], [place] [address]    Report Status PENDING  Incomplete         Radiology Studies: No results found.      Scheduled Meds: . sodium chloride   Intravenous Once  . sodium chloride   Intravenous Once  . sodium chloride   Intravenous Once  . aspirin EC  81 mg Oral BID  . atorvastatin  40 mg Oral Daily  . Chlorhexidine Gluconate Cloth  6 each Topical Daily  . docusate sodium  100 mg Oral BID  . feeding supplement  237 mL Oral BID BM  . ferrous sulfate  325 mg Oral BID WC  . folic acid  1 mg Oral Daily  . midodrine  10 mg Oral TID WC  . multivitamin with minerals  1 tablet Oral Daily  . sodium chloride  2 g Oral TID  . thiamine  100 mg Oral Daily   Or  . thiamine  100 mg Intravenous Daily  . vitamin B-12  1,000 mcg Oral Daily   Continuous Infusions: . sodium chloride Stopped (12/23/19 0248)  . sodium chloride  Stopped (12/21/19 1844)  . phenylephrine (NEO-SYNEPHRINE) Adult infusion Stopped (12/22/19 0622)     LOS: 10 days    Time spent:40 min    Tynan Boesel, Geraldo Docker, MD [facility] Pager [phone]  If 7PM-7AM, please contact night-coverage [email or web address] Password [facility] 12/23/2019, 10:34 AM

## 2019-12-23 NOTE — Progress Notes (Signed)
ANTICOAGULATION CONSULT NOTE  Pharmacy Consult for heparin SQ Indication: VTE prophylaxis  Labs: Recent Labs    12/21/19 0251 12/21/19 0251 12/21/19 1411 12/21/19 1411 12/22/19 0309 12/22/19 0309 12/22/19 1457 12/23/19 0625  HGB 6.6*   < > 7.7*   < > 7.1*   < > 8.2* 8.3*  HCT 19.3*   < > 22.6*   < > 21.1*  --  24.0* 24.6*  PLT 351   < > 266  --  220  --   --  230  CREATININE 0.94  --   --   --  0.68  --   --  0.59*   < > = values in this interval not displayed.    Assessment: 87 yom admitted after fall and ankle fracture, s/p ORIF 10/21 and ultimately ankle fusion 11/10. VTE prophylaxis held post-op initially due to drop in Hg, now s/p transfusion 11/11 and 11/12. Hg now stable at 8.3, plt wnl. No active bleed issues reported. Pharmacy consulted to start heparin SQ for VTE prophylaxis today.  Goal of Therapy:  VTE prevention Monitor platelets by anticoagulation protocol: Yes   Plan:  Heparin 5000 units SQ q8h Monitor CBC, s/sx bleeding Pharmacy will sign off consult and monitor peripherally   Elicia Lamp, PharmD, BCPS Clinical Pharmacist 12/23/2019 1:03 PM

## 2019-12-24 DIAGNOSIS — S82891G Other fracture of right lower leg, subsequent encounter for closed fracture with delayed healing: Secondary | ICD-10-CM | POA: Diagnosis not present

## 2019-12-24 DIAGNOSIS — S82891S Other fracture of right lower leg, sequela: Secondary | ICD-10-CM | POA: Diagnosis not present

## 2019-12-24 DIAGNOSIS — F1010 Alcohol abuse, uncomplicated: Secondary | ICD-10-CM | POA: Diagnosis not present

## 2019-12-24 DIAGNOSIS — S82891D Other fracture of right lower leg, subsequent encounter for closed fracture with routine healing: Secondary | ICD-10-CM | POA: Diagnosis not present

## 2019-12-24 LAB — CBC WITH DIFFERENTIAL/PLATELET
Abs Immature Granulocytes: 0.02 10*3/uL (ref 0.00–0.07)
Basophils Absolute: 0.1 10*3/uL (ref 0.0–0.1)
Basophils Relative: 1 %
Eosinophils Absolute: 0.2 10*3/uL (ref 0.0–0.5)
Eosinophils Relative: 3 %
HCT: 26.6 % — ABNORMAL LOW (ref 39.0–52.0)
Hemoglobin: 9 g/dL — ABNORMAL LOW (ref 13.0–17.0)
Immature Granulocytes: 0 %
Lymphocytes Relative: 17 %
Lymphs Abs: 1.1 10*3/uL (ref 0.7–4.0)
MCH: 31.7 pg (ref 26.0–34.0)
MCHC: 33.8 g/dL (ref 30.0–36.0)
MCV: 93.7 fL (ref 80.0–100.0)
Monocytes Absolute: 0.6 10*3/uL (ref 0.1–1.0)
Monocytes Relative: 9 %
Neutro Abs: 4.6 10*3/uL (ref 1.7–7.7)
Neutrophils Relative %: 70 %
Platelets: 243 10*3/uL (ref 150–400)
RBC: 2.84 MIL/uL — ABNORMAL LOW (ref 4.22–5.81)
RDW: 15.6 % — ABNORMAL HIGH (ref 11.5–15.5)
WBC: 6.6 10*3/uL (ref 4.0–10.5)
nRBC: 0 % (ref 0.0–0.2)

## 2019-12-24 LAB — COMPREHENSIVE METABOLIC PANEL
ALT: 24 U/L (ref 0–44)
AST: 45 U/L — ABNORMAL HIGH (ref 15–41)
Albumin: 2.6 g/dL — ABNORMAL LOW (ref 3.5–5.0)
Alkaline Phosphatase: 98 U/L (ref 38–126)
Anion gap: 6 (ref 5–15)
BUN: <5 mg/dL — ABNORMAL LOW (ref 8–23)
CO2: 27 mmol/L (ref 22–32)
Calcium: 8.3 mg/dL — ABNORMAL LOW (ref 8.9–10.3)
Chloride: 99 mmol/L (ref 98–111)
Creatinine, Ser: 0.52 mg/dL — ABNORMAL LOW (ref 0.61–1.24)
GFR, Estimated: >60 mL/min (ref >60)
Glucose, Bld: 98 mg/dL (ref 70–99)
Potassium: 3.5 mmol/L (ref 3.5–5.1)
Sodium: 132 mmol/L — ABNORMAL LOW (ref 135–145)
Total Bilirubin: 1.3 mg/dL — ABNORMAL HIGH (ref 0.3–1.2)
Total Protein: 4.5 g/dL — ABNORMAL LOW (ref 6.5–8.1)

## 2019-12-24 LAB — PHOSPHORUS: Phosphorus: 3 mg/dL (ref 2.5–4.6)

## 2019-12-24 LAB — MAGNESIUM: Magnesium: 1.7 mg/dL (ref 1.7–2.4)

## 2019-12-24 MED ORDER — MAGNESIUM SULFATE 50 % IJ SOLN
3.0000 g | Freq: Once | INTRAVENOUS | Status: AC
  Administered 2019-12-24: 3 g via INTRAVENOUS
  Filled 2019-12-24: qty 6

## 2019-12-24 MED ORDER — POTASSIUM CHLORIDE CRYS ER 20 MEQ PO TBCR
50.0000 meq | EXTENDED_RELEASE_TABLET | Freq: Once | ORAL | Status: AC
  Administered 2019-12-24: 50 meq via ORAL
  Filled 2019-12-24: qty 1

## 2019-12-24 NOTE — Progress Notes (Signed)
Patient with transfer orders to [facility]. Report given to [employer]. Patient transferred via bed at this time without incident. Wound Vac running as ordered. Cam walker brought with patient. VSS. Son, Elta [place], aware of transfer.

## 2019-12-24 NOTE — Progress Notes (Signed)
PROGRESS NOTE    Lee Wang  WER:[id number] DOB: November 13, 1945 DOA: 12/13/2019 PCP: Patient, No Pcp Per     Brief Narrative:  74 year old WM PMHx Etoh abuse, BPH, prostate cancer , tobacco abuse, HLD and HTN   who had a witnessed mechanical fall after missing a step on 11/19/2019 subsequently imaging studies demonstrated fracture dislocation of the ankle and acute fracture (avulsion) through the base of the fifth metatarsal, he underwent Closed reduction and splinting of bimalleolar right ankle fractureon 11/20/19, on 11/30/2019 patient underwent-Removal of pre-existing plate and screws from the fibula with revision ORIF of the fibula with a locking plate. ORIF of the vertical shear fracture of the medial malleolus -On 12/14/2019 patient was found to have failure of hardware with essentially conversion of his close fractures and open fracture, on 12/14/19 patient underwent Removal of hardware from right ankle,irrigation and debridement,unsuccessful attempt at placement of external fixation and splint application Transferred to [facility] from [facility] for definitive care .  Currently plan is treat hyponatremia.  Right tibiocalcaneal fusion tomorrow.   Subjective: 11/14 afebrile overnight, A/O x2 (does not know where, when).  Patient does note that he is in a hospital.  Cooperative, pleasant.    Assessment & Plan: Covid vaccination;   Principal Problem:   Closed right ankle fracture, sequela Active Problems:   Alcohol abuse   HTN (hypertension)   Depression with anxiety   Prostate CA (HCC)   BPH (benign prostatic hyperplasia)   Hyponatremia   Alcohol dependence (HCC)   Closed right ankle fracture   Tobacco abuse   Rupture of operation wound   Postprocedural hypotension   Shock ([place])   Acute metabolic encephalopathy/ Hypoosmolar Hyponatremia.   -Multifactorial EtOH abuse, poor p.o. intake. -Normal saline 108ml/hr -Monitor closely for further mental  status changes  -Monitor closely sodium level -Hold all medication which will lower sodium level. -Lexapro (hold) -NaCl tablets 2 g TID Lab Results  Component Value Date   NA 132 (L) 12/24/2019   NA 131 (L) 12/23/2019   NA 132 (L) 12/22/2019   NA 129 (L) 12/21/2019   NA 126 (L) 12/20/2019    EtOH abuse -Folic acid 1 mg daily -Thiamine 100 mg daily -Seizure precaution  RIGHT ankle fracture with failed hardware. Presented with an open wound with failed hardware on 12/14/2019. Underwent removal of the hardware, irrigation and debridement as well as unsuccessful attempt at placement of external fixation on 12/14/2019. Patient was on IV antibiotics. Patient is currently transferred to [facility] for ankle fusion by Dr. Sharol Given tentatively scheduled for Wednesday, 12/20/2019 -.11/10  RIGHT TIBIOCALCANEAL FUSION -11/11 per orthopedic surgery STRICT nonweightbearing RIGHT lower extremity  Hypotension/Essential HTN -HTN currently not a factor -Per PACU staff patient receiving Phenyl ephedrine during surgery and unable to titrate patient off without dropping BP. -11/10 Albumin 12.5 g + normal saline -Continue Phenyl ephedrine, continue to attempt to wean patient off in the ICU.  Currently at 80 mcg/min -Map goal> 65 -11/11 Phenylephedrine 70 mcg/min -11/11 albumin 50 g + normal saline 40ml/hr -11/13 BP improved with fluids and midodrine -11/14 patient now hypertensive DC Midodrine  Acute blood loss anemia/acute on chronic iron deficiency anemia. -Patient has had multiple surgeries in the past 30 days secondary to fractured ankle  -11/11 transfuse 2 unit PRBC -11/11 ADDENDUM; possible blood transfusion reaction paged by [name] who states 1 hour post second unit of PRBC patient has red welts on his legs buttocks arms that are pruritic.  Negative airway compromise. -Benadryl IV 12.5 mg x 1.  For possible blood transfusion reaction -11/12 transfuse 1 unit PRBC; pretreat  Benadryl IV 12.5 mg x 1 Lab Results  Component Value Date   HGB 9.0 (L) 12/24/2019   HGB 8.3 (L) 12/23/2019   HGB 8.2 (L) 12/22/2019   HGB 7.1 (L) 12/22/2019   HGB 7.7 (L) 12/21/2019  -Transfuse for hemoglobin<7  Blood transfusion reaction? -On 11/11 post 2 units PRBC patient developed welts that are pruritic responded to IV Benadryl. -In the future pretreat patient with Benadryl prior to transfusion  Tobacco abuse. -Holding off on nicotine patch due to confusion.  Monitor.  Depression and anxiety. -No suicidal ideation. -No significant anxiety as well. -Lexapro 10 mg daily (hold).  Secondary to hyponatremia  Incidentally RSV positive. -Continue droplet precaution.  No respiratory symptoms for now.  Hypokalemia -Potassium goal> 4 -11/14 K-Dur 50 mEq   Hypophosphatemia -Phosphorus goal> 2.5 -See hypokalemia  Hypomagnesmia -Magnesium goal> 2 -Magnesium IV 3 g     DVT prophylaxis: 11/13 anemia stable.  Start Heparin subcu   Code Status: Full Family Communication:  Status is: Inpatient    Dispo: The patient is from: Home              Anticipated d/c is to: SNF              Anticipated d/c date is:??              Patient currently unstable      Consultants:  Orthopedic surgery Dr. Sharol Given   Procedures/Significant Events:  11/10  RIGHT TIBIOCALCANEAL FUSION    I have personally reviewed and interpreted all radiology studies and my findings are as above.  VENTILATOR SETTINGS: Room air 11/14 SPO2 100%    Cultures 11/3 SARS coronavirus negative 11/3 influenza A/B negative 11/3 positive RSV   Antimicrobials: Anti-infectives (From admission, onward)   Start     Ordered Stop   12/20/19 0815  ceFAZolin (ANCEF) IVPB 2g/100 mL premix        12/20/19 0722 12/20/19 1255   12/15/19 0900  cefTRIAXone (ROCEPHIN) 1 g in sodium chloride 0.9 % 100 mL IVPB        12/15/19 0803 12/18/19 0736   12/14/19 1400  ceFAZolin (ANCEF) IVPB 2g/100 mL premix         12/14/19 1158 12/15/19 2049   12/14/19 0600  ceFAZolin (ANCEF) IVPB 2g/100 mL premix        12/13/19 1316 12/14/19 0807   12/13/19 1415  ceFAZolin (ANCEF) IVPB 2g/100 mL premix        12/13/19 1315 12/14/19 9924       Devices    LINES / TUBES:      Continuous Infusions: . sodium chloride Stopped (12/23/19 0248)  . sodium chloride Stopped (12/21/19 1844)     Objective: Vitals:   12/24/19 0500 12/24/19 0700 12/24/19 0800 12/24/19 0900  BP: (!) 166/71 (!) 155/71 (!) 157/79 (!) 162/76  Pulse:  (!) 59 62 65  Resp: 15 15 13 17   Temp:   98.1 F (36.7 C)   TempSrc:   Oral   SpO2: 100% 99% 98% 100%  Weight:      Height:        Intake/Output Summary (Last 24 hours) at 12/24/2019 1601 Last data filed at 12/24/2019 1000 Gross per 24 hour  Intake 294.3 ml  Output 2100 ml  Net -1805.7 ml   Filed Weights   12/13/19 1302 12/17/19 1248 12/20/19 1106  Weight: 56.2 kg 64.3 kg 64.3 kg   Physical Exam:  General: A/O x2 (does not know where, when), negative acute respiratory distress, cachectic Eyes: negative scleral hemorrhage, negative anisocoria, negative icterus ENT: Negative Runny nose, negative gingival bleeding, Neck:  Negative scars, masses, torticollis, lymphadenopathy, JVD Lungs: Clear to auscultation bilaterally without wheezes or crackles Cardiovascular: Regular rate and rhythm without murmur gallop or rub normal S1 and S2 Abdomen: negative abdominal pain, nondistended, positive soft, bowel sounds, no rebound, no ascites, no appreciable mass Extremities:  lower extremity with wound VAC in place, appears not to be draining any new fluid.  Fluid in reservoir appears to be old. Skin: Negative rashes, lesions, ulcers Psychiatric:  Negative depression, negative anxiety, negative fatigue, negative mania  Central nervous system:  Cranial nerves II through XII intact, tongue/uvula midline, all extremities muscle strength 5/5, sensation intact throughout, finger nose finger  bilateral within normal limits, quick finger touch bilateral within normal limits, negative dysarthria, negative expressive aphasia, negative receptive aphasia.     .     Data Reviewed: Care during the described time interval was provided by me .  I have reviewed this patient's available data, including medical history, events of note, physical examination, and all test results as part of my evaluation.  CBC: Recent Labs  Lab 12/20/19 0855 12/20/19 0855 12/21/19 0251 12/21/19 0251 12/21/19 1411 12/22/19 0309 12/22/19 1457 12/23/19 0625 12/24/19 0229  WBC 6.1   < > 24.6*  --  12.0* 10.5  --  6.8 6.6  NEUTROABS 4.0  --  21.5*  --   --  7.8*  --  5.3 4.6  HGB 8.2*   < > 6.6*   < > 7.7* 7.1* 8.2* 8.3* 9.0*  HCT 24.1*   < > 19.3*   < > 22.6* 21.1* 24.0* 24.6* 26.6*  MCV 96.0   < > 99.0  --  93.8 94.6  --  93.2 93.7  PLT 302   < > 351  --  266 220  --  230 243   < > = values in this interval not displayed.   Basic Metabolic Panel: Recent Labs  Lab 12/20/19 0855 12/21/19 0251 12/22/19 0309 12/23/19 0625 12/24/19 0229  NA 126* 129* 132* 131* 132*  K 4.1 5.1 3.7 3.5 3.5  CL 96* 99 105 102 99  CO2 25 19* 19* 22 27  GLUCOSE 88 94 95 92 98  BUN <5* 12 12 6* <5*  CREATININE 0.52* 0.94 0.68 0.59* 0.52*  CALCIUM 8.7* 8.4* 8.2* 8.2* 8.3*  MG 1.7 1.6* 1.9 1.6* 1.7  PHOS 3.4 4.9* 2.4* 2.4* 3.0   GFR: Estimated Creatinine Clearance: 73.7 mL/min (A) (by C-G formula based on SCr of 0.52 mg/dL (L)). Liver Function Tests: Recent Labs  Lab 12/20/19 0855 12/21/19 0251 12/22/19 0309 12/23/19 0625 12/24/19 0229  AST 31 47* 38 33 45*  ALT 20 27 23 20 24   ALKPHOS 132* 99 72 84 98  BILITOT 1.1 1.0 1.5* 1.2 1.3*  PROT 5.4* 4.7* 4.3* 4.3* 4.5*  ALBUMIN 2.5* 2.7* 2.7* 2.5* 2.6*   No results for input(s): LIPASE, AMYLASE in the last 168 hours. No results for input(s): AMMONIA in the last 168 hours. Coagulation Profile: No results for input(s): INR, PROTIME in the last 168  hours. Cardiac Enzymes: No results for input(s): CKTOTAL, CKMB, CKMBINDEX, TROPONINI in the last 168 hours. BNP (last 3 results) No results for input(s): PROBNP in the last 8760 hours. HbA1C: No results for input(s): HGBA1C in  the last 72 hours. CBG: No results for input(s): GLUCAP in the last 168 hours. Lipid Profile: No results for input(s): CHOL, HDL, LDLCALC, TRIG, CHOLHDL, LDLDIRECT in the last 72 hours. Thyroid Function Tests: No results for input(s): TSH, T4TOTAL, FREET4, T3FREE, THYROIDAB in the last 72 hours. Anemia Panel: No results for input(s): VITAMINB12, FOLATE, FERRITIN, TIBC, IRON, RETICCTPCT in the last 72 hours. Sepsis Labs: Recent Labs  Lab 12/20/19 1820 12/21/19 0251  PROCALCITON <0.10 0.21    Recent Results (from the past 240 hour(s))  MRSA PCR Screening     Status: None   Collection Time: 12/20/19  8:13 PM   Specimen: Nasal Mucosa; Nasopharyngeal  Result Value Ref Range Status   MRSA by PCR NEGATIVE NEGATIVE Final    Comment:        The GeneXpert MRSA Assay (FDA approved for NASAL specimens only), is one component of a comprehensive MRSA colonization surveillance program. It is not intended to diagnose MRSA infection nor to guide or monitor treatment for MRSA infections. Performed at [facility], [place] [address] [place], [place] [address]   Culture, blood (routine x 2)     Status: None (Preliminary result)   Collection Time: 12/20/19  9:23 PM   Specimen: BLOOD RIGHT HAND  Result Value Ref Range Status   Specimen Description BLOOD RIGHT HAND  Final   Special Requests   Final    BOTTLES DRAWN AEROBIC ONLY Blood Culture adequate volume   Culture   Final    NO GROWTH 4 DAYS Performed at [facility], [place] [address], [place], [place] [address]    Report Status PENDING  Incomplete  Culture, blood (routine x 2)     Status: None (Preliminary result)   Collection Time: 12/20/19  9:33 PM   Specimen: BLOOD LEFT HAND  Result Value Ref  Range Status   Specimen Description BLOOD LEFT HAND  Final   Special Requests   Final    BOTTLES DRAWN AEROBIC ONLY Blood Culture results may not be optimal due to an inadequate volume of blood received in culture bottles   Culture   Final    NO GROWTH 4 DAYS Performed at [facility], [place] [address], [place], [place] [address]    Report Status PENDING  Incomplete         Radiology Studies: No results found.      Scheduled Meds: . sodium chloride   Intravenous Once  . sodium chloride   Intravenous Once  . sodium chloride   Intravenous Once  . aspirin EC  81 mg Oral BID  . atorvastatin  40 mg Oral Daily  . Chlorhexidine Gluconate Cloth  6 each Topical Daily  . docusate sodium  100 mg Oral BID  . feeding supplement  237 mL Oral BID BM  . ferrous sulfate  325 mg Oral BID WC  . folic acid  1 mg Oral Daily  . heparin injection (subcutaneous)  5,000 Units Subcutaneous Q8H  . midodrine  10 mg Oral TID WC  . multivitamin with minerals  1 tablet Oral Daily  . sodium chloride  2 g Oral TID  . thiamine  100 mg Oral Daily   Or  . thiamine  100 mg Intravenous Daily  . vitamin B-12  1,000 mcg Oral Daily   Continuous Infusions: . sodium chloride Stopped (12/23/19 0248)  . sodium chloride Stopped (12/21/19 1844)     LOS: 11 days    Time spent:40 min  Allie Bossier, MD [facility] Pager [phone]  If 7PM-7AM, please contact night-coverage [email or web address] Password [email or web address] 12/24/2019, 4:01 PM

## 2019-12-24 NOTE — Progress Notes (Signed)
   12/24/19 1233  Clinical Encounter Type  Visited With Patient not available  Visit Type Code  Referral From Nurse  Consult/Referral To Chaplain  Chaplain responded to code. The family was not present. Dr. Caryl Bis informed me the family is not in the area; however, they will wait for an update. Chaplain services are available when the family arrive.This note was prepared by Jeanine Luz, M.Div..  For questions please contact by phone [phone].

## 2019-12-25 DIAGNOSIS — S82891G Other fracture of right lower leg, subsequent encounter for closed fracture with delayed healing: Secondary | ICD-10-CM | POA: Diagnosis not present

## 2019-12-25 DIAGNOSIS — S82891S Other fracture of right lower leg, sequela: Secondary | ICD-10-CM | POA: Diagnosis not present

## 2019-12-25 DIAGNOSIS — F1010 Alcohol abuse, uncomplicated: Secondary | ICD-10-CM | POA: Diagnosis not present

## 2019-12-25 DIAGNOSIS — S82891D Other fracture of right lower leg, subsequent encounter for closed fracture with routine healing: Secondary | ICD-10-CM | POA: Diagnosis not present

## 2019-12-25 LAB — CBC WITH DIFFERENTIAL/PLATELET
Abs Immature Granulocytes: 0.02 10*3/uL (ref 0.00–0.07)
Basophils Absolute: 0 10*3/uL (ref 0.0–0.1)
Basophils Relative: 1 %
Eosinophils Absolute: 0.2 10*3/uL (ref 0.0–0.5)
Eosinophils Relative: 5 %
HCT: 28.2 % — ABNORMAL LOW (ref 39.0–52.0)
Hemoglobin: 9.4 g/dL — ABNORMAL LOW (ref 13.0–17.0)
Immature Granulocytes: 0 %
Lymphocytes Relative: 21 %
Lymphs Abs: 1 10*3/uL (ref 0.7–4.0)
MCH: 32.3 pg (ref 26.0–34.0)
MCHC: 33.3 g/dL (ref 30.0–36.0)
MCV: 96.9 fL (ref 80.0–100.0)
Monocytes Absolute: 0.5 10*3/uL (ref 0.1–1.0)
Monocytes Relative: 11 %
Neutro Abs: 2.9 10*3/uL (ref 1.7–7.7)
Neutrophils Relative %: 62 %
Platelets: 289 10*3/uL (ref 150–400)
RBC: 2.91 MIL/uL — ABNORMAL LOW (ref 4.22–5.81)
RDW: 16.2 % — ABNORMAL HIGH (ref 11.5–15.5)
WBC: 4.7 10*3/uL (ref 4.0–10.5)
nRBC: 0 % (ref 0.0–0.2)

## 2019-12-25 LAB — CULTURE, BLOOD (ROUTINE X 2)
Culture: NO GROWTH
Culture: NO GROWTH
Special Requests: ADEQUATE

## 2019-12-25 LAB — HEPATITIS PANEL, ACUTE
HCV Ab: <0.1 s/co ratio — AB (ref 0.0–0.9)
Hep A IgM: NEGATIVE — AB
Hep B C IgM: NEGATIVE — AB
Hepatitis B Surface Ag: NEGATIVE — AB

## 2019-12-25 LAB — SARS CORONAVIRUS 2 BY RT PCR (HOSPITAL ORDER, PERFORMED IN ~~LOC~~ HOSPITAL LAB): SARS Coronavirus 2: NEGATIVE

## 2019-12-25 LAB — TROPONIN I (HIGH SENSITIVITY)
Troponin I (High Sensitivity): 7 ng/L (ref <18)
Troponin I (High Sensitivity): 5 ng/L (ref <18)

## 2019-12-25 LAB — PHOSPHORUS: Phosphorus: 2.9 mg/dL (ref 2.5–4.6)

## 2019-12-25 LAB — MAGNESIUM: Magnesium: 1.8 mg/dL (ref 1.7–2.4)

## 2019-12-25 MED ORDER — MAGNESIUM SULFATE 2 GM/50ML IV SOLN
2.0000 g | Freq: Once | INTRAVENOUS | Status: AC
  Administered 2019-12-25: 2 g via INTRAVENOUS
  Filled 2019-12-25: qty 50

## 2019-12-25 MED ORDER — METOPROLOL TARTRATE 5 MG/5ML IV SOLN
5.0000 mg | Freq: Once | INTRAVENOUS | Status: AC
  Administered 2019-12-25: 5 mg via INTRAVENOUS
  Filled 2019-12-25: qty 5

## 2019-12-25 MED ORDER — METOPROLOL TARTRATE 5 MG/5ML IV SOLN: 5.0000 mg | Freq: Three times a day (TID) | INTRAVENOUS | Status: DC | PRN

## 2019-12-25 MED ORDER — POTASSIUM CHLORIDE CRYS ER 20 MEQ PO TBCR
50.0000 meq | EXTENDED_RELEASE_TABLET | Freq: Once | ORAL | Status: AC
  Administered 2019-12-25: 50 meq via ORAL
  Filled 2019-12-25: qty 1

## 2019-12-25 NOTE — Progress Notes (Addendum)
PROGRESS NOTE    Lee Wang  LHT:[id number] DOB: March 16, 1945 DOA: 12/13/2019 PCP: Patient, No Pcp Per     Brief Narrative:  74 year old WM PMHx Etoh abuse, BPH, prostate cancer , tobacco abuse, HLD and HTN   who had a witnessed mechanical fall after missing a step on 11/19/2019 subsequently imaging studies demonstrated fracture dislocation of the ankle and acute fracture (avulsion) through the base of the fifth metatarsal, he underwent Closed reduction and splinting of bimalleolar right ankle fractureon 11/20/19, on 11/30/2019 patient underwent-Removal of pre-existing plate and screws from the fibula with revision ORIF of the fibula with a locking plate. ORIF of the vertical shear fracture of the medial malleolus -On 12/14/2019 patient was found to have failure of hardware with essentially conversion of his close fractures and open fracture, on 12/14/19 patient underwent Removal of hardware from right ankle,irrigation and debridement,unsuccessful attempt at placement of external fixation and splint application Transferred to [facility] from [facility] for definitive care .  Currently plan is treat hyponatremia.  Right tibiocalcaneal fusion tomorrow.   Subjective: 11/15 afebrile overnight A/O x2 (does not know when, why) afebrile overnight.  Cleared by surgery to be discharged to SNF   Assessment & Plan: Covid vaccination;   Principal Problem:   Closed right ankle fracture, sequela Active Problems:   Alcohol abuse   HTN (hypertension)   Depression with anxiety   Prostate CA (HCC)   BPH (benign prostatic hyperplasia)   Hyponatremia   Alcohol dependence ([place])   Closed right ankle fracture   Tobacco abuse   Rupture of operation wound   Postprocedural hypotension   Shock ([place])   Acute metabolic encephalopathy/ Hypoosmolar Hyponatremia.   -Multifactorial EtOH abuse, poor p.o. intake. -Monitor closely for further mental status changes  -Monitor closely  sodium level -Hold all medication which will lower sodium level. -Lexapro (hold) -NaCl tablets 2 g TID Lab Results  Component Value Date   NA 132 (L) 12/24/2019   NA 131 (L) 12/23/2019   NA 132 (L) 12/22/2019   NA 129 (L) 12/21/2019   NA 126 (L) 12/20/2019  -11/15 KVO normal saline  EtOH abuse -Folic acid 1 mg daily -Thiamine 100 mg daily -Seizure precaution  RIGHT ankle fracture with failed hardware. Presented with an open wound with failed hardware on 12/14/2019. Underwent removal of the hardware, irrigation and debridement as well as unsuccessful attempt at placement of external fixation on 12/14/2019. Patient was on IV antibiotics. Patient is currently transferred to [facility] for ankle fusion by Dr. Sharol Given tentatively scheduled for Wednesday, 12/20/2019 -.11/10  RIGHT TIBIOCALCANEAL FUSION -11/11 per orthopedic surgery STRICT nonweightbearing RIGHT lower extremity  Hypotension/Essential HTN -HTN currently not a factor -Per PACU staff patient receiving Phenyl ephedrine during surgery and unable to titrate patient off without dropping BP. -11/10 Albumin 12.5 g + normal saline -Continue Phenyl ephedrine, continue to attempt to wean patient off in the ICU.  Currently at 80 mcg/min -Map goal> 65 -11/11 Phenylephedrine 70 mcg/min -11/11 albumin 50 g + normal saline 67ml/hr -11/13 BP improved with fluids and midodrine -11/14 patient now hypertensive DC Midodrine  Acute blood loss anemia/acute on chronic iron deficiency anemia. -Patient has had multiple surgeries in the past 30 days secondary to fractured ankle  -11/11 transfuse 2 unit PRBC -11/11 ADDENDUM; possible blood transfusion reaction paged by [name] who states 1 hour post second unit of PRBC patient has red welts on his legs buttocks arms that are pruritic.  Negative airway compromise. -  Benadryl IV 12.5 mg x 1.  For possible blood transfusion reaction -11/12 transfuse 1 unit PRBC; pretreat Benadryl IV 12.5  mg x 1 Lab Results  Component Value Date   HGB 9.4 (L) 12/25/2019   HGB 9.0 (L) 12/24/2019   HGB 8.3 (L) 12/23/2019   HGB 8.2 (L) 12/22/2019   HGB 7.1 (L) 12/22/2019  -Transfuse for hemoglobin<7  Blood transfusion reaction? -On 11/11 post 2 units PRBC patient developed welts that are pruritic responded to IV Benadryl. -In the future pretreat patient with Benadryl prior to transfusion  Tobacco abuse. -Holding off on nicotine patch due to confusion.  Monitor.  Depression and anxiety. -No suicidal ideation. -No significant anxiety as well. -Lexapro 10 mg daily (hold).  Secondary to hyponatremia  Incidentally RSV positive. -Continue droplet precaution.  No respiratory symptoms for now.  Hypokalemia -Potassium goal> 4 -11/15 K-Dur 50 mEq  Hypophosphatemia -Phosphorus goal> 2.5   Hypomagnesmia -Magnesium goal> 2 -11/15 magnesium IV 2 g  ADDENDUM; 11/15 called by RN Chrys Racer who noticed elevated ST waves on monitor.  Patient asymptomatic however will obtain twelve-lead EKG, troponin, ensure all electrolytes at goal. -EKG NSR -Troponin negative   Goals of care -11/15 have requested to LCSW begin SNF search as patient has been cleared by orthopedic surgery     DVT prophylaxis: 11/13 anemia stable.  Start Heparin subcu   Code Status: Full Family Communication:  Status is: Inpatient    Dispo: The patient is from: Home              Anticipated d/c is to: SNF              Anticipated d/c date is:??              Patient currently Stable      Consultants:  Orthopedic surgery Dr. Sharol Given   Procedures/Significant Events:  11/10  RIGHT TIBIOCALCANEAL FUSION    I have personally reviewed and interpreted all radiology studies and my findings are as above.  VENTILATOR SETTINGS: Room air 11/15 SPO2 100%    Cultures 11/3 SARS coronavirus negative 11/3 influenza A/B negative 11/3 positive RSV   Antimicrobials: Anti-infectives (From admission, onward)    Start     Ordered Stop   12/20/19 0815  ceFAZolin (ANCEF) IVPB 2g/100 mL premix        12/20/19 0722 12/20/19 1255   12/15/19 0900  cefTRIAXone (ROCEPHIN) 1 g in sodium chloride 0.9 % 100 mL IVPB        12/15/19 0803 12/18/19 0736   12/14/19 1400  ceFAZolin (ANCEF) IVPB 2g/100 mL premix        12/14/19 1158 12/15/19 2049   12/14/19 0600  ceFAZolin (ANCEF) IVPB 2g/100 mL premix        12/13/19 1316 12/14/19 0807   12/13/19 1415  ceFAZolin (ANCEF) IVPB 2g/100 mL premix        12/13/19 1315 12/14/19 2440       Devices    LINES / TUBES:      Continuous Infusions: . sodium chloride Stopped (12/23/19 0248)  . sodium chloride Stopped (12/21/19 1844)     Objective: Vitals:   12/24/19 2339 12/24/19 2340 12/25/19 0321 12/25/19 0743  BP:  135/77 (!) 151/75 (!) 146/69  Pulse:    65  Resp:  11 16 14   Temp: 97.6 F (36.4 C)  97.6 F (36.4 C) 97.6 F (36.4 C)  TempSrc: Oral  Oral Oral  SpO2:  100% 100% 99%  Weight:  Height:        Intake/Output Summary (Last 24 hours) at 12/25/2019 0809 Last data filed at 12/25/2019 0743 Gross per 24 hour  Intake 0 ml  Output 1760 ml  Net -1760 ml   Filed Weights   12/13/19 1302 12/17/19 1248 12/20/19 1106  Weight: 56.2 kg 64.3 kg 64.3 kg   Physical Exam:  General: A/O x2 (does not know when, why), sitting in chair comfortably, No acute respiratory distress Eyes: negative scleral hemorrhage, negative anisocoria, negative icterus ENT: Negative Runny nose, negative gingival bleeding, Neck:  Negative scars, masses, torticollis, lymphadenopathy, JVD Lungs: Clear to auscultation bilaterally without wheezes or crackles Cardiovascular: Sinus tachycardia without murmur gallop or rub normal S1 and S2 Abdomen: negative abdominal pain, nondistended, positive soft, bowel sounds, no rebound, no ascites, no appreciable mass Extremities: lower extremity with wound VAC in place, appears not to be draining any new fluid.  Fluid in reservoir  appears to be old. Skin: Negative rashes, lesions, ulcers Psychiatric:  Negative depression, negative anxiety, negative fatigue, negative mania  Central nervous system:  Cranial nerves II through XII intact, tongue/uvula midline, all extremities muscle strength 5/5, sensation intact throughout, negative dysarthria, negative expressive aphasia, negative receptive aphasia.  .     Data Reviewed: Care during the described time interval was provided by me .  I have reviewed this patient's available data, including medical history, events of note, physical examination, and all test results as part of my evaluation.  CBC: Recent Labs  Lab 12/20/19 0855 12/20/19 0855 12/21/19 0251 12/21/19 0251 12/21/19 1411 12/22/19 0309 12/22/19 1457 12/23/19 0625 12/24/19 0229  WBC 6.1   < > 24.6*  --  12.0* 10.5  --  6.8 6.6  NEUTROABS 4.0  --  21.5*  --   --  7.8*  --  5.3 4.6  HGB 8.2*   < > 6.6*   < > 7.7* 7.1* 8.2* 8.3* 9.0*  HCT 24.1*   < > 19.3*   < > 22.6* 21.1* 24.0* 24.6* 26.6*  MCV 96.0   < > 99.0  --  93.8 94.6  --  93.2 93.7  PLT 302   < > 351  --  266 220  --  230 243   < > = values in this interval not displayed.   Basic Metabolic Panel: Recent Labs  Lab 12/20/19 0855 12/21/19 0251 12/22/19 0309 12/23/19 0625 12/24/19 0229  NA 126* 129* 132* 131* 132*  K 4.1 5.1 3.7 3.5 3.5  CL 96* 99 105 102 99  CO2 25 19* 19* 22 27  GLUCOSE 88 94 95 92 98  BUN <5* 12 12 6* <5*  CREATININE 0.52* 0.94 0.68 0.59* 0.52*  CALCIUM 8.7* 8.4* 8.2* 8.2* 8.3*  MG 1.7 1.6* 1.9 1.6* 1.7  PHOS 3.4 4.9* 2.4* 2.4* 3.0   GFR: Estimated Creatinine Clearance: 73.7 mL/min (A) (by C-G formula based on SCr of 0.52 mg/dL (L)). Liver Function Tests: Recent Labs  Lab 12/20/19 0855 12/21/19 0251 12/22/19 0309 12/23/19 0625 12/24/19 0229  AST 31 47* 38 33 45*  ALT 20 27 23 20 24   ALKPHOS 132* 99 72 84 98  BILITOT 1.1 1.0 1.5* 1.2 1.3*  PROT 5.4* 4.7* 4.3* 4.3* 4.5*  ALBUMIN 2.5* 2.7* 2.7* 2.5* 2.6*    No results for input(s): LIPASE, AMYLASE in the last 168 hours. No results for input(s): AMMONIA in the last 168 hours. Coagulation Profile: No results for input(s): INR, PROTIME in the last 168 hours. Cardiac Enzymes:  No results for input(s): CKTOTAL, CKMB, CKMBINDEX, TROPONINI in the last 168 hours. BNP (last 3 results) No results for input(s): PROBNP in the last 8760 hours. HbA1C: No results for input(s): HGBA1C in the last 72 hours. CBG: No results for input(s): GLUCAP in the last 168 hours. Lipid Profile: No results for input(s): CHOL, HDL, LDLCALC, TRIG, CHOLHDL, LDLDIRECT in the last 72 hours. Thyroid Function Tests: No results for input(s): TSH, T4TOTAL, FREET4, T3FREE, THYROIDAB in the last 72 hours. Anemia Panel: No results for input(s): VITAMINB12, FOLATE, FERRITIN, TIBC, IRON, RETICCTPCT in the last 72 hours. Sepsis Labs: Recent Labs  Lab 12/20/19 1820 12/21/19 0251  PROCALCITON <0.10 0.21    Recent Results (from the past 240 hour(s))  MRSA PCR Screening     Status: None   Collection Time: 12/20/19  8:13 PM   Specimen: Nasal Mucosa; Nasopharyngeal  Result Value Ref Range Status   MRSA by PCR NEGATIVE NEGATIVE Final    Comment:        The GeneXpert MRSA Assay (FDA approved for NASAL specimens only), is one component of a comprehensive MRSA colonization surveillance program. It is not intended to diagnose MRSA infection nor to guide or monitor treatment for MRSA infections. Performed at [facility], [place] [address], [place], [place] [address]   Culture, blood (routine x 2)     Status: None (Preliminary result)   Collection Time: 12/20/19  9:23 PM   Specimen: BLOOD RIGHT HAND  Result Value Ref Range Status   Specimen Description BLOOD RIGHT HAND  Final   Special Requests   Final    BOTTLES DRAWN AEROBIC ONLY Blood Culture adequate volume   Culture   Final    NO GROWTH 4 DAYS Performed at [facility], [place] [address] [place], [place]  [address]    Report Status PENDING  Incomplete  Culture, blood (routine x 2)     Status: None (Preliminary result)   Collection Time: 12/20/19  9:33 PM   Specimen: BLOOD LEFT HAND  Result Value Ref Range Status   Specimen Description BLOOD LEFT HAND  Final   Special Requests   Final    BOTTLES DRAWN AEROBIC ONLY Blood Culture results may not be optimal due to an inadequate volume of blood received in culture bottles   Culture   Final    NO GROWTH 4 DAYS Performed at [facility], [place] [address] [place], [place] [address]    Report Status PENDING  Incomplete         Radiology Studies: No results found.      Scheduled Meds: . sodium chloride   Intravenous Once  . sodium chloride   Intravenous Once  . sodium chloride   Intravenous Once  . aspirin EC  81 mg Oral BID  . atorvastatin  40 mg Oral Daily  . Chlorhexidine Gluconate Cloth  6 each Topical Daily  . docusate sodium  100 mg Oral BID  . feeding supplement  237 mL Oral BID BM  . ferrous sulfate  325 mg Oral BID WC  . folic acid  1 mg Oral Daily  . heparin injection (subcutaneous)  5,000 Units Subcutaneous Q8H  . multivitamin with minerals  1 tablet Oral Daily  . sodium chloride  2 g Oral TID  . thiamine  100 mg Oral Daily   Or  . thiamine  100 mg Intravenous Daily  . vitamin B-12  1,000 mcg Oral Daily   Continuous Infusions: . sodium  chloride Stopped (12/23/19 0248)  . sodium chloride Stopped (12/21/19 1844)     LOS: 12 days    Time spent:40 min    Arnell Mausolf, Geraldo Docker, MD [facility] Pager [phone]  If 7PM-7AM, please contact night-coverage [email or web address] Password [email or web address] 12/25/2019, 8:09 AM

## 2019-12-25 NOTE — Progress Notes (Signed)
Pt with tachypnea in the 1 teens-120's while up in chair. 5 mg IV Lopressor given per Dr. Sherral Hammers. See new orders for PRN Lopressor with parameters.   1700: Pt's hr now in the 70's-80's, but reading ST elevations. Pt denies cp, sob, etc. Dr. Sherral Hammers was text-paged to make aware. Orders for EKG, troponins, and potassium and magnesium replacement. This RN got 12 Lead EKG at bedside, and results did not transfer to computer, although EKG printed out. A second 12 Lead was obtained, and RN from ICU came to assist with machine. Both paper EKGs placed in paper charts and results now appear in Epic.   Justice Rocher, RN

## 2019-12-25 NOTE — Progress Notes (Signed)
PT Cancellation Note  Patient Details Name: Lee Wang MRN: [id number] DOB: [birth year]   Cancelled Treatment:    Reason Eval/Treat Not Completed: Other (comment). Upon arrival of PT this afternoon, the pt's EKG had just changed indicating ST segment elevation. RN was present and aware, asked PT to hold until rhythm can be further analyzed. PT will continue to follow and treat when appropriate.   Karma Ganja, PT, DPT   Acute Rehabilitation Department Pager #: [phone]   Otho Bellows 12/25/2019, 5:22 PM

## 2019-12-25 NOTE — Progress Notes (Signed)
Occupational Therapy Treatment Patient Details Name: Lee Wang MRN: [id number] DOB: [birth year] Today's Date: 12/25/2019    History of present illness The pt is a 74 yo male presenting s/p R tibiocalcaneal fusion 11/10 following failed previous surgical repairs. Following surgery, pt became hypotensive despite 2L fluid, and was transferred to the ICU for management. PMH includes: prostate cancer, HLD, HTN, and 50 pack-year history of tobacco use, and alcoholism.    OT comments  Pt making slow but steady improvement. Pt able to maintain NWB status of RLE with cues.  Pt very confused not recalling where he lives, date or what has happened during this day.  Feel pt is moving better but concerned if he will remember his precautions.  Feel SNF is best option for pt. Will continue to see with focus on more adls in bathroom in standing focusing on NWB status.   Follow Up Recommendations  SNF;Supervision/Assistance - 24 hour    Equipment Recommendations       Recommendations for Other Services      Precautions / Restrictions Precautions Precautions: Fall Precaution Comments: CAM boot Rt LE  Restrictions Weight Bearing Restrictions: Yes RLE Weight Bearing: Non weight bearing       Mobility Bed Mobility Overal bed mobility: Needs Assistance Bed Mobility: Sidelying to Sit Rolling: Min assist         General bed mobility comments: min assist only to get to EOB and scoot forward to get foot on floor.  Transfers Overall transfer level: Needs assistance Equipment used: Rolling walker (2 wheeled) Transfers: Sit to/from [facility] Sit to Stand: Min assist Stand pivot transfers: Mod assist       General transfer comment: cues for hand placement and NWB status of RLE    Balance Overall balance assessment: Needs assistance Sitting-balance support: Bilateral upper extremity supported Sitting balance-Leahy Scale: Fair Sitting balance - Comments: able to  maintain without UE support for short periods, prefers single or BUE support   Standing balance support: Bilateral upper extremity supported;During functional activity Standing balance-Leahy Scale: Poor Standing balance comment: reliant on BUE support and modA of 2                           ADL either performed or assessed with clinical judgement   ADL Overall ADL's : Needs assistance/impaired Eating/Feeding: Set up;Sitting   Grooming: Wash/dry hands;Oral care;Wash/dry face;Cueing for sequencing;Sitting Grooming Details (indicate cue type and reason): Pt performed sitting.  Do feel pt could do this task at sink now in standing with close assist and reminders of NWB status.             Lower Body Dressing: Moderate assistance;Sit to/from stand;Cueing for compensatory techniques;Cueing for safety Lower Body Dressing Details (indicate cue type and reason): PT donned socks and shoes at EOB with verbal cues. Pt required mod assist to stand and pull pants up.  Pt with difficulty letting go of walker to pull pants up with NWB status. Toilet Transfer: Moderate assistance;RW;BSC;Stand-pivot Toilet Transfer Details (indicate cue type and reason): pivot to [facility]. Toileting- Clothing Manipulation and Hygiene: Moderate assistance;Sit to/from stand;Cueing for compensatory techniques;Cueing for safety Toileting - Clothing Manipulation Details (indicate cue type and reason): min assist to clean self sitting on BSC.     Functional mobility during ADLs: Moderate assistance;Rolling walker;Cueing for safety General ADL Comments: increased assist given due to HR up to 147 with any amount of activity.     Vision  Vision Assessment?: No apparent visual deficits   Perception     Praxis      Cognition Arousal/Alertness: Awake/alert Behavior During Therapy: WFL for tasks assessed/performed Overall Cognitive Status: Impaired/Different from baseline Area of Impairment:  Orientation;Attention;Memory;Safety/judgement;Awareness;Problem solving                 Orientation Level: Disoriented to;Place;Time Current Attention Level: Sustained Memory: Decreased short-term memory;Decreased recall of precautions Following Commands: Follows one step commands consistently Safety/Judgement: Decreased awareness of safety;Decreased awareness of deficits Awareness: Intellectual Problem Solving: Slow processing;Requires verbal cues General Comments: Pt with very little STM but was alert. Pt looks to son to answer all questions.  Pt did recall his weight bearing status but did state others have told him he can walk whenever he wants however he wants.  Pt confused and tangentile.         Exercises     Shoulder Instructions       General Comments Pt very confused. Does not know where he lives, date, time etc.  Pt with HR up into 140s anytime pt transferred into standing.    Pertinent Vitals/ Pain       Pain Assessment: Faces Faces Pain Scale: Hurts little more Pain Location: right ankle Pain Descriptors / Indicators: Grimacing;Guarding Pain Intervention(s): Limited activity within patient's tolerance;Premedicated before session;Monitored during session;Repositioned  Home Living                                          Prior Functioning/Environment              Frequency  Min 2X/week        Progress Toward Goals  OT Goals(current goals can now be found in the care plan section)  Progress towards OT goals: Progressing toward goals  Acute Rehab OT Goals Patient Stated Goal: none stated OT Goal Formulation: With patient Time For Goal Achievement: 01/04/20 Potential to Achieve Goals: Good ADL Goals Pt Will Perform Grooming: with min assist;standing Pt Will Perform Upper Body Bathing: with set-up;with supervision;sitting Pt Will Perform Lower Body Bathing: with min assist;sit to/from stand;with adaptive equipment Pt Will  Perform Upper Body Dressing: with set-up;with supervision;sitting Pt Will Perform Lower Body Dressing: with min assist;with adaptive equipment;sit to/from stand Pt Will Transfer to Toilet: with min assist;bedside commode;stand pivot transfer Pt Will Perform Toileting - Clothing Manipulation and hygiene: with min assist;sit to/from stand Pt/caregiver will Perform Home Exercise Program: Increased strength;Both right and left upper extremity;Independently;With written HEP provided Additional ADL Goal #1: Pt will sustain attention to simple ADL tasks x 5 mins with min cues  Plan Discharge plan remains appropriate    Co-evaluation                 AM-PAC OT "6 Clicks" Daily Activity     Outcome Measure   Help from another person eating meals?: A Little Help from another person taking care of personal grooming?: A Little Help from another person toileting, which includes using toliet, bedpan, or urinal?: A Lot Help from another person bathing (including washing, rinsing, drying)?: A Lot Help from another person to put on and taking off regular upper body clothing?: A Little Help from another person to put on and taking off regular lower body clothing?: A Lot 6 Click Score: 15    End of Session    OT Visit Diagnosis: Unsteadiness on feet (R26.81);Muscle weakness (generalized) (  M62.81);Cognitive communication deficit (R41.841);Pain Pain - Right/Left: Right Pain - part of body: Ankle and joints of foot   Activity Tolerance Treatment limited secondary to medical complications (Comment) (HR up to 140s with activity)   Patient Left in chair;with call bell/phone within reach;with chair alarm set;with family/visitor present   Nurse Communication Mobility status;Other (comment) (HR to 140s)        Time: 1239-3594 OT Time Calculation (min): 42 min  Charges: OT General Charges $OT Visit: 1 Visit OT Treatments $Self Care/Home Management : 38-52 mins   Glenford Peers 12/25/2019,  12:56 PM

## 2019-12-25 NOTE — Progress Notes (Signed)
Patient ID: Lee Wang, male   DOB: 12-02-1945, 73 y.o.   MRN: [id number] Patient is status post tibial calcaneal fusion on the right.  There is no change in the drainage in the wound VAC canister 425 cc there is a good suction fit.  We will plan to remove the wound VAC prior to discharge.  Anticipate discharge to skilled nursing.

## 2019-12-26 DIAGNOSIS — B351 Tinea unguium: Secondary | ICD-10-CM | POA: Diagnosis not present

## 2019-12-26 DIAGNOSIS — F1010 Alcohol abuse, uncomplicated: Secondary | ICD-10-CM | POA: Diagnosis not present

## 2019-12-26 DIAGNOSIS — Z7401 Bed confinement status: Secondary | ICD-10-CM | POA: Diagnosis not present

## 2019-12-26 DIAGNOSIS — E46 Unspecified protein-calorie malnutrition: Secondary | ICD-10-CM | POA: Diagnosis not present

## 2019-12-26 DIAGNOSIS — S82853A Displaced trimalleolar fracture of unspecified lower leg, initial encounter for closed fracture: Secondary | ICD-10-CM | POA: Diagnosis not present

## 2019-12-26 DIAGNOSIS — Z981 Arthrodesis status: Secondary | ICD-10-CM | POA: Diagnosis not present

## 2019-12-26 DIAGNOSIS — T8131XD Disruption of external operation (surgical) wound, not elsewhere classified, subsequent encounter: Secondary | ICD-10-CM | POA: Diagnosis not present

## 2019-12-26 DIAGNOSIS — D62 Acute posthemorrhagic anemia: Secondary | ICD-10-CM | POA: Diagnosis not present

## 2019-12-26 DIAGNOSIS — M2550 Pain in unspecified joint: Secondary | ICD-10-CM | POA: Diagnosis not present

## 2019-12-26 DIAGNOSIS — J99 Respiratory disorders in diseases classified elsewhere: Secondary | ICD-10-CM | POA: Diagnosis not present

## 2019-12-26 DIAGNOSIS — E876 Hypokalemia: Secondary | ICD-10-CM | POA: Diagnosis not present

## 2019-12-26 DIAGNOSIS — R262 Difficulty in walking, not elsewhere classified: Secondary | ICD-10-CM | POA: Diagnosis not present

## 2019-12-26 DIAGNOSIS — I9581 Postprocedural hypotension: Secondary | ICD-10-CM | POA: Diagnosis not present

## 2019-12-26 DIAGNOSIS — E785 Hyperlipidemia, unspecified: Secondary | ICD-10-CM | POA: Diagnosis not present

## 2019-12-26 DIAGNOSIS — R404 Transient alteration of awareness: Secondary | ICD-10-CM | POA: Diagnosis not present

## 2019-12-26 DIAGNOSIS — S92301G Fracture of unspecified metatarsal bone(s), right foot, subsequent encounter for fracture with delayed healing: Secondary | ICD-10-CM | POA: Diagnosis not present

## 2019-12-26 DIAGNOSIS — I959 Hypotension, unspecified: Secondary | ICD-10-CM | POA: Diagnosis not present

## 2019-12-26 DIAGNOSIS — M6281 Muscle weakness (generalized): Secondary | ICD-10-CM | POA: Diagnosis not present

## 2019-12-26 DIAGNOSIS — S82851S Displaced trimalleolar fracture of right lower leg, sequela: Secondary | ICD-10-CM | POA: Diagnosis not present

## 2019-12-26 DIAGNOSIS — F418 Other specified anxiety disorders: Secondary | ICD-10-CM | POA: Diagnosis not present

## 2019-12-26 DIAGNOSIS — I10 Essential (primary) hypertension: Secondary | ICD-10-CM | POA: Diagnosis not present

## 2019-12-26 DIAGNOSIS — I7091 Generalized atherosclerosis: Secondary | ICD-10-CM | POA: Diagnosis not present

## 2019-12-26 DIAGNOSIS — T8092XD Unspecified transfusion reaction, subsequent encounter: Secondary | ICD-10-CM | POA: Diagnosis not present

## 2019-12-26 DIAGNOSIS — Z4781 Encounter for orthopedic aftercare following surgical amputation: Secondary | ICD-10-CM | POA: Diagnosis not present

## 2019-12-26 DIAGNOSIS — Z743 Need for continuous supervision: Secondary | ICD-10-CM | POA: Diagnosis not present

## 2019-12-26 DIAGNOSIS — T84498S Other mechanical complication of other internal orthopedic devices, implants and grafts, sequela: Secondary | ICD-10-CM | POA: Diagnosis not present

## 2019-12-26 DIAGNOSIS — I469 Cardiac arrest, cause unspecified: Secondary | ICD-10-CM | POA: Diagnosis not present

## 2019-12-26 DIAGNOSIS — S82891G Other fracture of right lower leg, subsequent encounter for closed fracture with delayed healing: Secondary | ICD-10-CM | POA: Diagnosis not present

## 2019-12-26 DIAGNOSIS — S82891S Other fracture of right lower leg, sequela: Secondary | ICD-10-CM | POA: Diagnosis not present

## 2019-12-26 DIAGNOSIS — S2232XD Fracture of one rib, left side, subsequent encounter for fracture with routine healing: Secondary | ICD-10-CM | POA: Diagnosis not present

## 2019-12-26 DIAGNOSIS — D649 Anemia, unspecified: Secondary | ICD-10-CM | POA: Diagnosis not present

## 2019-12-26 DIAGNOSIS — E871 Hypo-osmolality and hyponatremia: Secondary | ICD-10-CM | POA: Diagnosis not present

## 2019-12-26 DIAGNOSIS — Z720 Tobacco use: Secondary | ICD-10-CM | POA: Diagnosis not present

## 2019-12-26 DIAGNOSIS — C61 Malignant neoplasm of prostate: Secondary | ICD-10-CM

## 2019-12-26 DIAGNOSIS — Z4789 Encounter for other orthopedic aftercare: Secondary | ICD-10-CM | POA: Diagnosis not present

## 2019-12-26 LAB — CBC WITH DIFFERENTIAL/PLATELET
Abs Immature Granulocytes: 0.01 10*3/uL (ref 0.00–0.07)
Basophils Absolute: 0 10*3/uL (ref 0.0–0.1)
Basophils Relative: 1 %
Eosinophils Absolute: 0.3 10*3/uL (ref 0.0–0.5)
Eosinophils Relative: 7 %
HCT: 27.6 % — ABNORMAL LOW (ref 39.0–52.0)
Hemoglobin: 9.1 g/dL — ABNORMAL LOW (ref 13.0–17.0)
Immature Granulocytes: 0 %
Lymphocytes Relative: 26 %
Lymphs Abs: 1.1 10*3/uL (ref 0.7–4.0)
MCH: 32 pg (ref 26.0–34.0)
MCHC: 33 g/dL (ref 30.0–36.0)
MCV: 97.2 fL (ref 80.0–100.0)
Monocytes Absolute: 0.5 10*3/uL (ref 0.1–1.0)
Monocytes Relative: 12 %
Neutro Abs: 2.4 10*3/uL (ref 1.7–7.7)
Neutrophils Relative %: 54 %
Platelets: 269 10*3/uL (ref 150–400)
RBC: 2.84 MIL/uL — ABNORMAL LOW (ref 4.22–5.81)
RDW: 16.1 % — ABNORMAL HIGH (ref 11.5–15.5)
WBC: 4.4 10*3/uL (ref 4.0–10.5)
nRBC: 0 % (ref 0.0–0.2)

## 2019-12-26 LAB — MAGNESIUM: Magnesium: 2.1 mg/dL (ref 1.7–2.4)

## 2019-12-26 LAB — PHOSPHORUS: Phosphorus: 3 mg/dL (ref 2.5–4.6)

## 2019-12-26 MED ORDER — SENNOSIDES-DOCUSATE SODIUM 8.6-50 MG PO TABS: 2.0000 | ORAL_TABLET | Freq: Every evening | ORAL | 0 refills | Status: DC | PRN

## 2019-12-26 MED ORDER — ADULT MULTIVITAMIN W/MINERALS CH
1.0000 | ORAL_TABLET | Freq: Every day | ORAL | 0 refills | Status: DC
Start: 2019-12-27 — End: 2022-12-29

## 2019-12-26 MED ORDER — ACETAMINOPHEN 325 MG PO TABS
650.0000 mg | ORAL_TABLET | Freq: Four times a day (QID) | ORAL | 0 refills | Status: DC | PRN
Start: 2019-12-26 — End: 2022-12-29

## 2019-12-26 MED ORDER — FOLIC ACID 1 MG PO TABS
1.0000 mg | ORAL_TABLET | Freq: Every day | ORAL | 0 refills | Status: DC
Start: 2019-12-27 — End: 2022-12-29

## 2019-12-26 MED ORDER — ENSURE ENLIVE PO LIQD: 237.0000 mL | Freq: Two times a day (BID) | ORAL | 0 refills | Status: DC

## 2019-12-26 MED ORDER — ONDANSETRON HCL 4 MG PO TABS
4.0000 mg | ORAL_TABLET | Freq: Four times a day (QID) | ORAL | 0 refills | Status: DC | PRN
Start: 2019-12-26 — End: 2022-12-29

## 2019-12-26 MED ORDER — THIAMINE HCL 100 MG PO TABS
100.0000 mg | ORAL_TABLET | Freq: Every day | ORAL | 0 refills | Status: DC
Start: 2019-12-27 — End: 2022-12-29

## 2019-12-26 MED ORDER — FERROUS SULFATE 325 (65 FE) MG PO TABS
325.0000 mg | ORAL_TABLET | Freq: Two times a day (BID) | ORAL | 0 refills | Status: DC
Start: 2019-12-26 — End: 2022-12-29

## 2019-12-26 MED ORDER — OXYCODONE HCL 5 MG PO TABS
5.0000 mg | ORAL_TABLET | ORAL | 0 refills | Status: DC | PRN
Start: 2019-12-26 — End: 2022-12-29

## 2019-12-26 MED ORDER — CYANOCOBALAMIN 1000 MCG PO TABS: 1000.0000 ug | ORAL_TABLET | Freq: Every day | ORAL | 0 refills | Status: DC

## 2019-12-26 NOTE — Progress Notes (Signed)
RN called report to [place].

## 2019-12-26 NOTE — Progress Notes (Signed)
Physical Therapy Treatment Patient Details Name: Lee Wang MRN: [id number] DOB: [birth year] Today's Date: 12/26/2019    History of Present Illness The pt is a 74 yo male presenting s/p R tibiocalcaneal fusion 11/10 following failed previous surgical repairs. Following surgery, pt became hypotensive despite 2L fluid, and was transferred to the ICU for management. PMH includes: prostate cancer, HLD, HTN, and 50 pack-year history of tobacco use, and alcoholism.     PT Comments    Patient progressing well towards PT goals. Tolerated gait training today with use of RW and Min A. Compliant with NWB status of RLE. Able to state precautions and need to wear CAM boot for all OOB mobility. Noted to have elevated HR up to 153 bpm max during session. Requires seated rest break due to HR and fatigue. Continues to be confused, not sure what his baseline is.  Plan is for d/c today. Will follow if still in the hospital.   Follow Up Recommendations  SNF;Supervision/Assistance - 24 hour;Supervision for mobility/OOB     Equipment Recommendations  Other (comment) (defer to post acute venue)    Recommendations for Other Services       Precautions / Restrictions Precautions Precautions: Fall Precaution Comments: watch HR, CAM boot RLE Required Braces or Orthoses: Other Brace Other Brace: CAm boot Restrictions Weight Bearing Restrictions: Yes RLE Weight Bearing: Non weight bearing    Mobility  Bed Mobility               General bed mobility comments: Up in chair upon PT arrival.  Transfers Overall transfer level: Needs assistance Equipment used: Rolling walker (2 wheeled) Transfers: Sit to/from Stand Sit to Stand: Min assist         General transfer comment: cues for hand placement and NWB status of RLE. Assist to power to standing from chair x2.  Ambulation/Gait Ambulation/Gait assistance: Min assist;+2 safety/equipment Gait Distance (Feet): 8 Feet (+ 16') Assistive device:  Rolling walker (2 wheeled) Gait Pattern/deviations:  ("hop to") Gait velocity: cues to decrease speed   General Gait Details: Pt with "hop to" gait with increased WB through BUEs, cues for RW proximity. HR up to 153 bpm, asymptomatic,.   Stairs             Wheelchair Mobility    Modified Rankin (Stroke Patients Only)       Balance Overall balance assessment: Needs assistance Sitting-balance support: Feet supported;No upper extremity supported Sitting balance-Leahy Scale: Good     Standing balance support: During functional activity Standing balance-Leahy Scale: Poor Standing balance comment: reliant on BUE support and Min guard-Min A, compliant with WB status.                            Cognition Arousal/Alertness: Awake/alert Behavior During Therapy: WFL for tasks assessed/performed Overall Cognitive Status: No family/caregiver present to determine baseline cognitive functioning                         Following Commands: Follows one step commands consistently Safety/Judgement: Decreased awareness of safety;Decreased awareness of deficits Awareness: Intellectual Problem Solving: Requires verbal cues General Comments: "I dont keep track of that stuff, the only thing i keep track of is my heart beat. Then I know I will live another day." Able to recall WB status as well as knowing to wear CAM boot for ambulation.      Exercises General Exercises - Lower Extremity Straight Leg  Raises: Strengthening;Right;10 reps;Seated    General Comments General comments (skin integrity, edema, etc.): HR up to 153 bpm with activity.      Pertinent Vitals/Pain Pain Assessment: No/denies pain    Home Living                      Prior Function            PT Goals (current goals can now be found in the care plan section) Progress towards PT goals: Progressing toward goals    Frequency    Min 3X/week      PT Plan Current plan remains  appropriate    Co-evaluation              AM-PAC PT "6 Clicks" Mobility   Outcome Measure  Help needed turning from your back to your side while in a flat bed without using bedrails?: A Little Help needed moving from lying on your back to sitting on the side of a flat bed without using bedrails?: A Little Help needed moving to and from a bed to a chair (including a wheelchair)?: A Little Help needed standing up from a chair using your arms (e.g., wheelchair or bedside chair)?: A Little Help needed to walk in hospital room?: A Little Help needed climbing 3-5 steps with a railing? : Total 6 Click Score: 16    End of Session Equipment Utilized During Treatment: Gait belt;Other (comment) (CAM boot) Activity Tolerance: Patient tolerated treatment well;Treatment limited secondary to medical complications (Comment) (tachycardia) Patient left: in chair;with call bell/phone within reach;with chair alarm set Nurse Communication: Mobility status PT Visit Diagnosis: Other abnormalities of gait and mobility (R26.89);Unsteadiness on feet (R26.81)     Time: 1130-1145 PT Time Calculation (min) (ACUTE ONLY): 15 min  Charges:  $Therapeutic Activity: 8-22 mins                     Marisa Severin, PT, DPT Acute Rehabilitation Services Pager [phone] Office [phone]       [name] 12/26/2019, 12:02 PM

## 2019-12-26 NOTE — Progress Notes (Addendum)
Patient is 7 days status post right tibial calcaneal fusion he is lying in bed appears comfortable.   Wound VAC was removed today.  Only had about 20 cc further drainage in the back than yesterday wound edges are well approximated no necrosis swelling is well controlled just bloody drainage   Will write for daily dressing change he may shower and get wound wet with antibacterial soap and water.  Will need 1 week follow-up in our office

## 2019-12-26 NOTE — TOC Transition Note (Signed)
Transition of Care [facility]) - CM/SW Discharge Note   Patient Details  Name: Lee Wang MRN: [id number] Date of Birth: 22-May-1945  [facility] CM/SW Contact:  Vinie Sill, [place] Phone Number: 12/26/2019, 11:25 AM   Clinical Narrative:     Patient will DC to: [place] Date: 12/26/2019 Family Notified: Leslie,daughter Transport HU:[facility]   Per MD patient is ready for discharge. RN, patient, and facility notified of DC. Discharge Summary sent to facility. RN given number for [phone]. Ambulance transport requested for patient.   Clinical Social Worker signing off.  Thurmond Butts, MSW, LCSWA Clinical Social Worker    Final next level of care: Skilled Nursing Facility Barriers to Discharge: Barriers Resolved   Patient Goals and CMS Choice   CMS Medicare.gov Compare Post Acute Care list provided to::  (pt came from [facility], wants to return)    Discharge Placement              Patient chooses bed at: [facility] Patient to be transferred to facility by: [place] Name of family member notified: Leslie,daughter Patient and family notified of of transfer: 12/26/19  Discharge Plan and Services In-house Referral: Clinical Social Work   Post Acute Care Choice: Resumption of Svcs/PTA Provider                               Social Determinants of Health (SDOH) Interventions     Readmission Risk Interventions Readmission Risk Prevention Plan 12/15/2019  Transportation Screening Complete  Medication Review (RN CM) Complete  Some recent data might be hidden

## 2019-12-26 NOTE — Discharge Summary (Signed)
Physician Discharge Summary  ARNE SCHLENDER DTO:[id number] DOB: 04-03-1945 DOA: 12/13/2019  PCP: Patient, No Pcp Per  Admit date: 12/13/2019 Discharge date: 12/26/2019  Time spent: 35 minutes  Recommendations for Outpatient Follow-up:  Covid vaccination;unk  Acute metabolic encephalopathy/Hypoosmolar Hyponatremia.  -Multifactorial EtOH abuse, poor p.o. intake. -Monitor closely for further mental status changes  -Monitor closely sodium level -Hold all medication which will lower sodium level. -Lexapro (hold) -NaCl tablets 2 g TID Lab Results  Component Value Date   NA 132 (L) 12/24/2019   NA 131 (L) 12/23/2019   NA 132 (L) 12/22/2019   NA 129 (L) 12/21/2019   NA 126 (L) 12/20/2019  -Stable  EtOH abuse -Folic acid 1 mg daily -Thiamine 100 mg daily -Seizure precaution  RIGHT ankle fracture with failed hardware. Presented with an open wound with failed hardware on 12/14/2019. Underwent removal of the hardware, irrigation and debridement as well as unsuccessful attempt at placement of external fixation on 12/14/2019. -.11/10RIGHT TIBIOCALCANEAL FUSION by Dr. Sharol Given -11/11 per orthopedic surgery STRICT nonweightbearing RIGHT lower extremity -may shower and get wound wet with antibacterial soap and water per surgery -Schedule follow-up appointment with Dr. Sharol Given in 1 week for RIGHT ankle fracture with failed hardware  Hypotension/Essential HTN -Resolved  Acute blood loss anemia/acute on chronic iron deficiency anemia. -Patient has had multiple surgeries in the past 30 days secondary to fractured ankle  -11/11 transfuse 2 unit PRBC -11/11 ADDENDUM; possible blood transfusion reaction paged by [name] who states 1 hour post second unit of PRBC patient has red welts on his legs buttocks arms that are pruritic.  Negative airway compromise. -Benadryl IV 12.5 mg x 1.  For possible blood transfusion reaction -11/12 transfuse 1 unit PRBC; pretreat Benadryl IV 12.5 mg x 1 Lab  Results  Component Value Date   HGB 9.1 (L) 12/26/2019   HGB 9.4 (L) 12/25/2019   HGB 9.0 (L) 12/24/2019   HGB 8.3 (L) 12/23/2019   HGB 8.2 (L) 12/22/2019  -Stable -Transfuse for hemoglobin<7  Blood transfusion reaction? -On 11/11 post 2 units PRBC patient developed welts that are pruritic responded to IV Benadryl. -In the future pretreat patient with Benadryl prior to transfusion  Tobacco abuse. -Holding off on nicotine patch due to confusion. Monitor.  Depression and anxiety. -No suicidal ideation. -No significant anxiety as well. -Lexapro 10 mg daily (hold).  Secondary to hyponatremia  Incidentally RSV positive. -Continue droplet precaution. No respiratory symptoms for now.  Hypokalemia -Potassium goal> 4  Hypophosphatemia -Phosphorus goal> 2.5   Hypomagnesmia -Magnesium goal> 2  11/15 ADDENDUM; Elevated ST waves on monitor called by RN Chrys Racer  - Patient asymptomatic however will obtain twelve-lead EKG, troponin, ensure all electrolytes at goal. -EKG NSR -Troponin negative -Patient stable for discharge   Discharge Diagnoses:  Principal Problem:   Closed right ankle fracture, sequela Active Problems:   Alcohol abuse   HTN (hypertension)   Depression with anxiety   Prostate CA (HCC)   BPH (benign prostatic hyperplasia)   Hyponatremia   Alcohol dependence (HCC)   Closed right ankle fracture   Tobacco abuse   Rupture of operation wound   Postprocedural hypotension   Shock ([place])   Discharge Condition: Stable  Diet recommendation: Regular  Filed Weights   12/13/19 1302 12/17/19 1248 12/20/19 1106  Weight: 56.2 kg 64.3 kg 64.3 kg    History of present illness:  74 year old WM PMHx Etoh abuse, BPH, prostate cancer , tobacco abuse, HLD and HTN   who had a witnessed mechanical  fall after missing a step on 11/19/2019 subsequently imaging studies demonstrated fracture dislocation of the ankle and acute fracture (avulsion) through the base of  the fifth metatarsal, he underwent Closed reduction and splinting of bimalleolar right ankle fractureon 11/20/19, on 11/30/2019 patient underwent-Removal of pre-existing plate and screws from the fibula with revision ORIF of the fibula with a locking plate. ORIF of the vertical shear fracture of the medial malleolus -On 12/14/2019 patient was found to have failure of hardware with essentially conversion of his close fractures and open fracture, on 12/14/19 patient underwent Removal of hardware from right ankle,irrigation and debridement,unsuccessful attempt at placement of external fixation and splint application Transferred to [place] from [facility] for definitive care .  Currently plan istreat hyponatremia. Right tibiocalcaneal fusion tomorrow.   Hospital Course:  See above  Procedures: 11/10RIGHT TIBIOCALCANEAL FUSION   Consultations: Orthopedic surgery Dr. Sharol Given   Cultures  11/3 SARS coronavirus negative 11/3 influenza A/B negative 11/3 positive RSV  11/15 SARS coronavirus negative   Antibiotics Anti-infectives (From admission, onward)   Start     Ordered Stop   12/20/19 0815  ceFAZolin (ANCEF) IVPB 2g/100 mL premix        12/20/19 0722 12/20/19 1255   12/15/19 0900  cefTRIAXone (ROCEPHIN) 1 g in sodium chloride 0.9 % 100 mL IVPB        12/15/19 0803 12/18/19 0736   12/14/19 1400  ceFAZolin (ANCEF) IVPB 2g/100 mL premix        12/14/19 1158 12/15/19 2049   12/14/19 0600  ceFAZolin (ANCEF) IVPB 2g/100 mL premix        12/13/19 1316 12/14/19 0807   12/13/19 1415  ceFAZolin (ANCEF) IVPB 2g/100 mL premix        12/13/19 1315 12/14/19 0607       Discharge Exam: Vitals:   12/25/19 1917 12/25/19 2319 12/26/19 0300 12/26/19 0749  BP: 125/68 (!) 122/59 (!) 145/67   Pulse: 71 67    Resp: 16 15 17    Temp: 97.8 F (36.6 C) 97.8 F (36.6 C) 97.6 F (36.4 C) 98.2 F (36.8 C)  TempSrc: Oral Oral Oral Oral  SpO2: 97% 100%    Weight:       Height:        General: A/O x2 (does not know when, why), sitting in chair comfortably, No acute respiratory distress Eyes: negative scleral hemorrhage, negative anisocoria, negative icterus ENT: Negative Runny nose, negative gingival bleeding, Neck:  Negative scars, masses, torticollis, lymphadenopathy, JVD Lungs: Clear to auscultation bilaterally without wheezes or crackles Cardiovascular: Sinus tachycardia without murmur gallop or rub normal S1 and S2  Discharge Instructions   Allergies as of 12/26/2019   No Known Allergies     Medication List    STOP taking these medications   ALPRAZolam 0.5 MG tablet Commonly known as: XANAX   escitalopram 10 MG tablet Commonly known as: LEXAPRO   propranolol 20 MG tablet Commonly known as: INDERAL     TAKE these medications   acetaminophen 325 MG tablet Commonly known as: TYLENOL Take 2 tablets (650 mg total) by mouth every 6 (six) hours as needed for mild pain or headache. What changed:   medication strength  how much to take  when to take this  reasons to take this   aspirin EC 81 MG tablet Take 1 tablet (81 mg total) by mouth in the morning and at bedtime.   atorvastatin 40 MG tablet Commonly known as: LIPITOR Take 1 tablet (40  mg total) by mouth daily.   cyanocobalamin 1000 MCG tablet Take 1 tablet (1,000 mcg total) by mouth daily. Start taking on: December 27, 2019   docusate sodium 100 MG capsule Commonly known as: Colace Take 1 capsule (100 mg total) by mouth daily as needed for moderate constipation (While taking narcotic; hold for loose stools).   feeding supplement Liqd Take 237 mLs by mouth 2 (two) times daily between meals.   ferrous sulfate 325 (65 FE) MG tablet Take 1 tablet (325 mg total) by mouth 2 (two) times daily with a meal.   folic acid 1 MG tablet Commonly known as: FOLVITE Take 1 tablet (1 mg total) by mouth daily. Start taking on: December 27, 2019   multivitamin with minerals Tabs  tablet Take 1 tablet by mouth daily. Start taking on: December 27, 2019   ondansetron 4 MG tablet Commonly known as: ZOFRAN Take 1 tablet (4 mg total) by mouth every 6 (six) hours as needed for nausea.   oxyCODONE 5 MG immediate release tablet Commonly known as: Oxy IR/ROXICODONE Take 1-2 tablets (5-10 mg total) by mouth every 4 (four) hours as needed for moderate pain (pain score 4-6).   senna-docusate 8.6-50 MG tablet Commonly known as: Senokot-S Take 2 tablets by mouth at bedtime as needed for mild constipation.   thiamine 100 MG tablet Take 1 tablet (100 mg total) by mouth daily. Start taking on: December 27, 2019      No Known Allergies  Follow-up Information    Persons, [place]evely Palmer, [place] In 1 week.   Specialty: Orthopedic Surgery Contact information: [address] [place] [address] [phone]                The results of significant diagnostics from this hospitalization (including imaging, microbiology, ancillary and laboratory) are listed below for reference.    Significant Diagnostic Studies: DG Ankle 2 Views Right  Result Date: 11/30/2019 CLINICAL DATA:  ORIF right ankle fracture EXAM: DG C-ARM 1-60 MIN; RIGHT ANKLE - 2 VIEW COMPARISON:  11/20/2019 FLUOROSCOPY TIME:  Fluoroscopy Time:  2 minutes Radiation Exposure Index (if provided by the fluoroscopic device): Not available Number of Acquired Spot Images: 5 FINDINGS: Previously seen fixation sideplate is again noted along the distal fibula. Previously seen medial malleolar fracture is again noted involving the tibiotalar articulation. The subluxation of the talus was reduced and a fixation sideplate was placed along the distal tibia with an additional fixation screw traversing the medial malleolus. New fixation screws noted traversing the fibular sideplate into the tibia. Fracture fragments are in near anatomic alignment. IMPRESSION: Progressive ORIF of distal tibial and fibular fractures. Electronically  Signed   By: Inez Catalina M.D.   On: 11/30/2019 15:14   DG Ankle Complete Right  Result Date: 12/14/2019 CLINICAL DATA:  Removal of hardware in attempted external fixation. EXAM: DG C-ARM 1-60 MIN; RIGHT ANKLE - COMPLETE 3+ VIEW FLUOROSCOPY TIME:  Fluoroscopy Time:  1 minutes 19 seconds COMPARISON:  Radiographs December 13, 2019. FINDINGS: Five C-arm fluoroscopic images were obtained intraoperatively and submitted for post operative interpretation. These images demonstrate removal of the previously noted ankle fixation hardware. Image labeled #3 demonstrates attempted external fixation of the tibia with 2 screws. Please see the performing provider's procedural report for further detail. IMPRESSION: Intraoperative fluoroscopic images, as detailed above. Electronically Signed   By: Margaretha Sheffield MD   On: 12/14/2019 11:38   DG Ankle Complete Right  Result Date: 12/13/2019 XR of the right ankle demonstrates loss  of fixation of both the medial and lateral malleoli.  Medial plate and screws has pulled out.  Ankle is similar to injury films. Impression: failure of previous ORIF, both medial and lateral malleoli  US Abdomen Complete  Result Date: 12/21/2019 CLINICAL DATA:  Cirrhosis EXAM: ABDOMEN ULTRASOUND COMPLETE COMPARISON:  04/11/2013 FINDINGS: Gallbladder: The gallbladder is partially decompressed resulting in artifactual mild wall thickening. Trace pericholecystic fluid is present, nonspecific. No intraluminal stones or sludge is identified. The gallbladder is not distended. The sonographic Percell Miller sign is reportedly negative. Common bile duct: Diameter: 3 mm Liver: The liver demonstrates a progressively nodular contour and increasing heterogeneity of the parenchymal echotexture in keeping progressive changes of cirrhosis. No focal intrahepatic masses identified. No intrahepatic biliary ductal dilation. Portal vein is patent on color Doppler imaging with normal direction of blood flow towards the  liver. IVC: No abnormality visualized. Pancreas: Visualized portion unremarkable. Spleen: Size and appearance within normal limits. Right Kidney: Length: 9.9 cm. Echogenicity within normal limits. No mass or hydronephrosis visualized. Left Kidney: Length: 10 cm. Echogenicity within normal limits. No mass or hydronephrosis visualized. Abdominal aorta: No aneurysm visualized. Other findings: None. IMPRESSION: Progressive parenchymal changes in keeping with cirrhosis. No focal liver mass identified. Trace pericholecystic fluid is nonspecific and may represent trace perihepatic ascites in the setting of cirrhosis. Electronically Signed   By: Fidela Salisbury MD   On: 12/21/2019 04:06   DG Ankle Right Port  Result Date: 11/30/2019 CLINICAL DATA:  Status post ORIF of right ankle fracture EXAM: PORTABLE RIGHT ANKLE - 2 VIEW COMPARISON:  11/20/2019 FINDINGS: There has been interval removal of 1 of the fixation screws in the fibular side plate with a longer screw placed traversing into the tibia. Fixation sideplate along the medial aspect of the tibia is noted with several screws. A single screws noted traversing the medial malleolus. Fracture fragments are in near anatomic alignment. Splinting material is noted. IMPRESSION: Interval progressive fixation of the distal tibial and fibular fractures. Electronically Signed   By: Inez Catalina M.D.   On: 11/30/2019 15:13   DG C-Arm 1-60 Min  Result Date: 12/14/2019 CLINICAL DATA:  Removal of hardware in attempted external fixation. EXAM: DG C-ARM 1-60 MIN; RIGHT ANKLE - COMPLETE 3+ VIEW FLUOROSCOPY TIME:  Fluoroscopy Time:  1 minutes 19 seconds COMPARISON:  Radiographs December 13, 2019. FINDINGS: Five C-arm fluoroscopic images were obtained intraoperatively and submitted for post operative interpretation. These images demonstrate removal of the previously noted ankle fixation hardware. Image labeled #3 demonstrates attempted external fixation of the tibia with 2 screws.  Please see the performing provider's procedural report for further detail. IMPRESSION: Intraoperative fluoroscopic images, as detailed above. Electronically Signed   By: Margaretha Sheffield MD   On: 12/14/2019 11:38   DG C-Arm 1-60 Min  Result Date: 11/30/2019 CLINICAL DATA:  ORIF right ankle fracture EXAM: DG C-ARM 1-60 MIN; RIGHT ANKLE - 2 VIEW COMPARISON:  11/20/2019 FLUOROSCOPY TIME:  Fluoroscopy Time:  2 minutes Radiation Exposure Index (if provided by the fluoroscopic device): Not available Number of Acquired Spot Images: 5 FINDINGS: Previously seen fixation sideplate is again noted along the distal fibula. Previously seen medial malleolar fracture is again noted involving the tibiotalar articulation. The subluxation of the talus was reduced and a fixation sideplate was placed along the distal tibia with an additional fixation screw traversing the medial malleolus. New fixation screws noted traversing the fibular sideplate into the tibia. Fracture fragments are in near anatomic alignment. IMPRESSION: Progressive ORIF of distal tibial  and fibular fractures. Electronically Signed   By: Inez Catalina M.D.   On: 11/30/2019 15:14    Microbiology: Recent Results (from the past 240 hour(s))  MRSA PCR Screening     Status: None   Collection Time: 12/20/19  8:13 PM   Specimen: Nasal Mucosa; Nasopharyngeal  Result Value Ref Range Status   MRSA by PCR NEGATIVE NEGATIVE Final    Comment:        The GeneXpert MRSA Assay (FDA approved for NASAL specimens only), is one component of a comprehensive MRSA colonization surveillance program. It is not intended to diagnose MRSA infection nor to guide or monitor treatment for MRSA infections. Performed at [facility], [place] [address], [place], [place] [address]   Culture, blood (routine x 2)     Status: None   Collection Time: 12/20/19  9:23 PM   Specimen: BLOOD RIGHT HAND  Result Value Ref Range Status   Specimen Description BLOOD RIGHT HAND   Final   Special Requests   Final    BOTTLES DRAWN AEROBIC ONLY Blood Culture adequate volume   Culture   Final    NO GROWTH 5 DAYS Performed at [facility], [place] [address], [place], [place] [address]    Report Status 12/25/2019 FINAL  Final  Culture, blood (routine x 2)     Status: None   Collection Time: 12/20/19  9:33 PM   Specimen: BLOOD LEFT HAND  Result Value Ref Range Status   Specimen Description BLOOD LEFT HAND  Final   Special Requests   Final    BOTTLES DRAWN AEROBIC ONLY Blood Culture results may not be optimal due to an inadequate volume of blood received in culture bottles   Culture   Final    NO GROWTH 5 DAYS Performed at [facility], [place] [address], [place], [place] [address]    Report Status 12/25/2019 FINAL  Final  SARS Coronavirus 2 by RT PCR (hospital order, performed in [facility] hospital lab) Nasopharyngeal Nasopharyngeal Swab     Status: None   Collection Time: 12/25/19  4:35 PM   Specimen: Nasopharyngeal Swab  Result Value Ref Range Status   SARS Coronavirus 2 NEGATIVE NEGATIVE Final    Comment: (NOTE) SARS-CoV-2 target nucleic acids are NOT DETECTED.  The SARS-CoV-2 RNA is generally detectable in upper and lower respiratory specimens during the acute phase of infection. The lowest concentration of SARS-CoV-2 viral copies this assay can detect is 250 copies / mL. A negative result does not preclude SARS-CoV-2 infection and should not be used as the sole basis for treatment or other patient management decisions.  A negative result may occur with improper specimen collection / handling, submission of specimen other than nasopharyngeal swab, presence of viral mutation(s) within the areas targeted by this assay, and inadequate number of viral copies (<250 copies / mL). A negative result must be combined with clinical observations, patient history, and epidemiological information.  Fact Sheet for Patients:    StrictlyIdeas.no  Fact Sheet for Healthcare Providers: BankingDealers.co.za  This test is not yet approved or  cleared by the [place] FDA and has been authorized for detection and/or diagnosis of SARS-CoV-2 by FDA under an Emergency Use Authorization (EUA).  This EUA will remain in effect (meaning this test can be used) for the duration of the COVID-19 declaration under Section 564(b)(1) of the Act, 21 U.S.C. section 360bbb-3(b)(1), unless the authorization is terminated or revoked sooner.  Performed at [facility], [address], [place], [place] [address]      Labs: Basic Metabolic Panel: Recent Labs  Lab 12/20/19 [phone] 12/20/19 0855 12/21/19 0251 12/21/19 0251 12/22/19 0309 12/23/19 0625 12/24/19 0229 12/25/19 0755 12/26/19 0328  NA 126*  --  129*  --  132* 131* 132*  --   --   K 4.1  --  5.1  --  3.7 3.5 3.5  --   --   CL 96*  --  99  --  105 102 99  --   --   CO2 25  --  19*  --  19* 22 27  --   --   GLUCOSE 88  --  94  --  95 92 98  --   --   BUN <5*  --  12  --  12 6* <5*  --   --   CREATININE 0.52*  --  0.94  --  0.68 0.59* 0.52*  --   --   CALCIUM 8.7*  --  8.4*  --  8.2* 8.2* 8.3*  --   --   MG 1.7   < > 1.6*   < > 1.9 1.6* 1.7 1.8 2.1  PHOS 3.4   < > 4.9*   < > 2.4* 2.4* 3.0 2.9 3.0   < > = values in this interval not displayed.   Liver Function Tests: Recent Labs  Lab 12/20/19 0855 12/21/19 0251 12/22/19 0309 12/23/19 0625 12/24/19 0229  AST 31 47* 38 33 45*  ALT 20 27 23 20 24   ALKPHOS 132* 99 72 84 98  BILITOT 1.1 1.0 1.5* 1.2 1.3*  PROT 5.4* 4.7* 4.3* 4.3* 4.5*  ALBUMIN 2.5* 2.7* 2.7* 2.5* 2.6*   No results for input(s): LIPASE, AMYLASE in the last 168 hours. No results for input(s): AMMONIA in the last 168 hours. CBC: Recent Labs  Lab 12/22/19 0309 12/22/19 0309 12/22/19 1457 12/23/19 0625 12/24/19 0229 12/25/19 0755 12/26/19 0328  WBC 10.5  --   --  6.8 6.6 4.7 4.4   NEUTROABS 7.8*  --   --  5.3 4.6 2.9 2.4  HGB 7.1*   < > 8.2* 8.3* 9.0* 9.4* 9.1*  HCT 21.1*   < > 24.0* 24.6* 26.6* 28.2* 27.6*  MCV 94.6  --   --  93.2 93.7 96.9 97.2  PLT 220  --   --  230 243 289 269   < > = values in this interval not displayed.   Cardiac Enzymes: No results for input(s): CKTOTAL, CKMB, CKMBINDEX, TROPONINI in the last 168 hours. BNP: BNP (last 3 results) No results for input(s): BNP in the last 8760 hours.  ProBNP (last 3 results) No results for input(s): PROBNP in the last 8760 hours.  CBG: No results for input(s): GLUCAP in the last 168 hours.     Signed:  Dia Crawford, MD [facility] [phone] pager

## 2019-12-28 DIAGNOSIS — I959 Hypotension, unspecified: Secondary | ICD-10-CM | POA: Diagnosis not present

## 2019-12-28 DIAGNOSIS — S82853A Displaced trimalleolar fracture of unspecified lower leg, initial encounter for closed fracture: Secondary | ICD-10-CM | POA: Diagnosis not present

## 2019-12-28 DIAGNOSIS — D649 Anemia, unspecified: Secondary | ICD-10-CM | POA: Diagnosis not present

## 2019-12-28 DIAGNOSIS — E871 Hypo-osmolality and hyponatremia: Secondary | ICD-10-CM | POA: Diagnosis not present

## 2020-01-02 ENCOUNTER — Ambulatory Visit: Payer: Medicare Other | Admitting: Physician Assistant

## 2020-01-03 ENCOUNTER — Encounter: Payer: Self-pay | Admitting: Family

## 2020-01-03 ENCOUNTER — Ambulatory Visit (INDEPENDENT_AMBULATORY_CARE_PROVIDER_SITE_OTHER): Payer: Medicare Other

## 2020-01-03 ENCOUNTER — Ambulatory Visit (INDEPENDENT_AMBULATORY_CARE_PROVIDER_SITE_OTHER): Payer: Medicare Other | Admitting: Family

## 2020-01-03 VITALS — Ht 67.0 in | Wt 141.0 lb

## 2020-01-03 DIAGNOSIS — Z981 Arthrodesis status: Secondary | ICD-10-CM

## 2020-01-03 NOTE — Progress Notes (Signed)
Post-Op Visit Note   Patient: Lee Wang           Date of Birth: September 19, 1945           MRN: [id number] Visit Date: 01/03/2020 PCP: Patient, No Pcp Per  Chief Complaint:  Chief Complaint  Patient presents with  . Right Ankle - Routine Post Op    12/20/19 right tibiocalcaneal fusion      HPI:  HPI Patient is a 74 year old gentleman seen status post right tibiocalcaneal fusion on November 10 he has a been residing at skilled nursing they accompany the visit they state he has been doing much better with nonweightbearing than he has with previous surgeries  Ortho Exam Incisions well approximated with sutures the lateral incision is well-healed and dry.  The medial incision does have some minimal bloody drainage.  Plantar heel incision well healed sutures harvested today without incident.  The posterior heel does have open ulceration it appears the suture has been removed this is 15 mm by about 5 mm filled in with proud granulation there is no drainage no surrounding erythema odor no sign of infection  Visit Diagnoses:  1. H/O ankle fusion     Plan: Daily Dial soap cleansing of the incisions dry dressing changes.  Given a new cam walker.  Elevate for swelling.  Nonweightbearing.  Follow-up in 2 weeks with repeat radiographs.  Follow-Up Instructions: Return in about 2 weeks (around 01/17/2020).   Imaging: No results found.  Orders:  Orders Placed This Encounter  Procedures  . XR Ankle Complete Right   No orders of the defined types were placed in this encounter.    PMFS History: Patient Active Problem List   Diagnosis Date Noted  . Postprocedural hypotension   . Shock ([place])   . Rupture of operation wound   . Closed right ankle fracture, sequela 12/13/2019  . Exposed orthopaedic hardware ([place]) 12/13/2019  . Avulsion fracture of metatarsal bone of right foot with delayed healing 11/20/2019  . Left rib fracture 11/20/2019  . Hyponatremia 11/20/2019  . Alcohol  dependence ([place]) 11/20/2019  . Closed right ankle fracture 11/20/2019  . Tobacco abuse 11/20/2019  . Closed trimalleolar fracture of right ankle   . Greater tuberosity of humerus fracture 10/14/2016  . Closed fracture dislocation of joint of left shoulder girdle 10/08/2016  . GAD (generalized anxiety disorder) 03/09/2015  . Elevated LFTs 11/05/2014  . Abnormal [place] (ultrasound) of abdomen 11/05/2014  . Hepatomegaly 11/05/2014  . History of colonic polyps 11/05/2014  . HTN (hypertension) 05/28/2010  . Hyperlipemia 05/28/2010  . Depression with anxiety 05/28/2010  . Prostate CA ([place]) 05/28/2010  . BPH (benign prostatic hyperplasia) 05/28/2010  . Alcohol abuse 07/18/2009  . HEMATOCHEZIA 07/18/2009  . DIARRHEA 07/18/2009  . ABDOMINAL PAIN, UNSPECIFIED SITE 07/18/2009   Past Medical History:  Diagnosis Date  . Anxiety   . Arthritis   . Cancer [facility]) prostate   2013  . Depression   . Heavy drinker of alcohol   . Hyperlipidemia   . Hypertension     Family History  Problem Relation Age of Onset  . Alzheimer's disease Mother   . Heart disease Father   . Alzheimer's disease Paternal Grandfather   . Colon cancer Neg Hx   . Liver disease Neg Hx     Past Surgical History:  Procedure Laterality Date  . ANKLE CLOSED REDUCTION Right 11/20/2019   Procedure: CLOSED REDUCTION ANKLE;  Surgeon: Mordecai Rasmussen, MD;  [facility];  Service: Orthopedics;  Laterality: Right;  . ANKLE FUSION Right 12/20/2019   Procedure: RIGHT TIBIOCALCANEAL FUSION;  Surgeon: [name], MD;  Location: [place];  Service: Orthopedics;  Laterality: Right;  . CATARACT EXTRACTION W/PHACO Right 08/21/2013   Procedure: CATARACT EXTRACTION PHACO AND INTRAOCULAR LENS PLACEMENT (IOC);  Surgeon: Tonny Branch, MD;  [facility]: [facility];  Service: Ophthalmology;  Laterality: Right;  CDE 7.34  . CATARACT EXTRACTION W/PHACO Left 09/14/2013   Procedure: CATARACT EXTRACTION PHACO AND INTRAOCULAR LENS PLACEMENT (IOC);  Surgeon:  Tonny Branch, MD;  Location: [facility];  Service: Ophthalmology;  Laterality: Left;  CDE: 11.50  . COLONOSCOPY  2011   Recc repeat 3 years external / anal canal hemorrhoids, otherwise normal rectum. 2. multiple colonic polyps with largest ulcerated pedunculated polyp in the midsigmoid, status post multiple  hot snare polypectomies. no evidence of colitis or diverticuliosis.   Marland Kitchen EXTERNAL FIXATION LEG Right 12/14/2019   Procedure: ATTEMPTED EXTERNAL FIXATION RIGHT ANKLE;  Surgeon: Mordecai Rasmussen, MD;  [facility];  Service: Orthopedics;  Laterality: Right;  . FRACTURE SURGERY Bilateral    ankle  . HARDWARE REMOVAL Right 11/30/2019   Procedure: HARDWARE REMOVAL; right fibula plate and screws;  Surgeon: Mordecai Rasmussen, MD;  [facility];  Service: Orthopedics;  Laterality: Right;  . HARDWARE REMOVAL Right 12/14/2019   Procedure: HARDWARE REMOVAL OF RIGHT ANKLE;  Surgeon: Mordecai Rasmussen, MD;  [facility];  Service: Orthopedics;  Laterality: Right;  . IRRIGATION AND DEBRIDEMENT FOOT Right 12/14/2019   Procedure: IRRIGATION AND DEBRIDEMENT RIGHT ANKLE;  Surgeon: Mordecai Rasmussen, MD;  [facility];  Service: Orthopedics;  Laterality: Right;  . ORIF ANKLE FRACTURE Right 11/30/2019   Procedure: Operative Fixation of Right Ankle Fracture;  Surgeon: Mordecai Rasmussen, MD;  [facility];  Service: Orthopedics;  Laterality: Right;  ORIF medial mal; revision ORIF fibula  . ORIF HUMERUS FRACTURE Left 10/14/2016   Procedure: OPEN REDUCTION INTERNAL FIXATION (ORIF) LEFT SHOULDER FRACTURE/DISLOCATION, OPEN REDUCTION INTERNAL FIXATION GREATER TUBEROSITY;  Surgeon: Marybelle Killings, MD;  Location: [place];  Service: Orthopedics;  Laterality: Left;  . PROSTATE SURGERY     radiation implants   Social History   Occupational History  . Not on file  Tobacco Use  . Smoking status: Current Every Day Smoker    Packs/day: 1.00    Years: 50.00    Pack years: 50.00    Types: Cigarettes    Start date: 08/09/1958  .  Smokeless tobacco: Current User    Types: Chew  Vaping Use  . Vaping Use: Never used  Substance and Sexual Activity  . Alcohol use: Yes    Alcohol/week: 10.0 standard drinks    Types: 10 Standard drinks or equivalent per week    Comment: drinks at least once a week, minimum 6 drinks per occurrence.  . Drug use: No  . Sexual activity: Not Currently

## 2020-01-16 DIAGNOSIS — I7091 Generalized atherosclerosis: Secondary | ICD-10-CM | POA: Diagnosis not present

## 2020-01-17 ENCOUNTER — Ambulatory Visit (INDEPENDENT_AMBULATORY_CARE_PROVIDER_SITE_OTHER): Payer: Medicare Other | Admitting: Physician Assistant

## 2020-01-17 ENCOUNTER — Encounter: Payer: Self-pay | Admitting: Family

## 2020-01-17 ENCOUNTER — Ambulatory Visit (INDEPENDENT_AMBULATORY_CARE_PROVIDER_SITE_OTHER): Payer: Medicare Other

## 2020-01-17 VITALS — Ht 67.0 in | Wt 141.0 lb

## 2020-01-17 DIAGNOSIS — Z981 Arthrodesis status: Secondary | ICD-10-CM

## 2020-01-17 NOTE — Progress Notes (Signed)
Office Visit Note   Patient: Lee Wang           Date of Birth: 1945/07/07           MRN: [id number] Visit Date: 01/17/2020              Requested by: No referring provider defined for this encounter. PCP: Patient, No Pcp Per  Chief Complaint  Patient presents with  . Right Ankle - Routine Post Op    12/20/19 right tibiocalcaneal fusion       HPI: This is a pleasant 74 year old gentleman who is 5 weeks status post right tibial calcaneal fusion.  He has been at a nursing facility.  He has no complaints of pain.  Assessment & Plan: Visit Diagnoses:  1. H/O ankle fusion     Plan: Patient will be allowed to weight-bear as tolerated but must remain in his cam walker boot.  He is not to do any weightbearing out of the boot.  He may sleep without the boot.  He will help in 3 weeks.  I did speak and recommend a compression sock for some of the swelling in his foot given his fusion and stiffness now in the ankle  Follow-Up Instructions: Return in about 2 weeks (around 01/31/2020).   Ortho Exam  Patient is alert, oriented, no adenopathy, well-dressed, normal affect, normal respiratory effort. Right ankle: Well-healed surgical incisions.  Sutures removed without difficulty.  Pulses are intact.  He does have some mild swelling in his foot but no erythema no cellulitis  Imaging: No results found. No images are attached to the encounter.  Labs: Lab Results  Component Value Date   REPTSTATUS 12/25/2019 FINAL 12/20/2019   CULT  12/20/2019    NO GROWTH 5 DAYS Performed at [facility], [place] [address] [place], [place] [address]      Lab Results  Component Value Date   ALBUMIN 2.6 (L) 12/24/2019   ALBUMIN 2.5 (L) 12/23/2019   ALBUMIN 2.7 (L) 12/22/2019    Lab Results  Component Value Date   MG 2.1 12/26/2019   MG 1.8 12/25/2019   MG 1.7 12/24/2019   Lab Results  Component Value Date   VD25OH 20.9 (L) 10/31/2012    No results found for: PREALBUMIN CBC  EXTENDED Latest Ref Rng & Units 12/26/2019 12/25/2019 12/24/2019  WBC 4.0 - 10.5 K/uL 4.4 4.7 6.6  RBC 4.22 - 5.81 MIL/uL 2.84(L) 2.91(L) 2.84(L)  HGB 13.0 - 17.0 g/dL 9.1(L) 9.4(L) 9.0(L)  HCT 39 - 52 % 27.6(L) 28.2(L) 26.6(L)  PLT 150 - 400 K/uL 269 289 243  NEUTROABS 1.7 - 7.7 K/uL 2.4 2.9 4.6  LYMPHSABS 0.7 - 4.0 K/uL 1.1 1.0 1.1     Body mass index is 22.08 kg/m.  Orders:  Orders Placed This Encounter  Procedures  . XR Ankle Complete Right   No orders of the defined types were placed in this encounter.    Procedures: No procedures performed  Clinical Data: No additional findings.  ROS:  All other systems negative, except as noted in the HPI. Review of Systems  Objective: Vital Signs: Ht 5\' 7"  (1.702 m)   Wt 141 lb (64 kg)   BMI 22.08 kg/m   Specialty Comments:  No specialty comments available.  PMFS History: Patient Active Problem List   Diagnosis Date Noted  . Postprocedural hypotension   . Shock ([place])   . Rupture of operation wound   . Closed right ankle fracture, sequela 12/13/2019  .  Exposed orthopaedic hardware ([place]) 12/13/2019  . Avulsion fracture of metatarsal bone of right foot with delayed healing 11/20/2019  . Left rib fracture 11/20/2019  . Hyponatremia 11/20/2019  . Alcohol dependence ([place]) 11/20/2019  . Closed right ankle fracture 11/20/2019  . Tobacco abuse 11/20/2019  . Closed trimalleolar fracture of right ankle   . Greater tuberosity of humerus fracture 10/14/2016  . Closed fracture dislocation of joint of left shoulder girdle 10/08/2016  . GAD (generalized anxiety disorder) 03/09/2015  . Elevated LFTs 11/05/2014  . Abnormal [place] (ultrasound) of abdomen 11/05/2014  . Hepatomegaly 11/05/2014  . History of colonic polyps 11/05/2014  . HTN (hypertension) 05/28/2010  . Hyperlipemia 05/28/2010  . Depression with anxiety 05/28/2010  . Prostate CA ([place]) 05/28/2010  . BPH (benign prostatic hyperplasia) 05/28/2010  . Alcohol abuse  07/18/2009  . HEMATOCHEZIA 07/18/2009  . DIARRHEA 07/18/2009  . ABDOMINAL PAIN, UNSPECIFIED SITE 07/18/2009   Past Medical History:  Diagnosis Date  . Anxiety   . Arthritis   . Cancer [facility]) prostate   2013  . Depression   . Heavy drinker of alcohol   . Hyperlipidemia   . Hypertension     Family History  Problem Relation Age of Onset  . Alzheimer's disease Mother   . Heart disease Father   . Alzheimer's disease Paternal Grandfather   . Colon cancer Neg Hx   . Liver disease Neg Hx     Past Surgical History:  Procedure Laterality Date  . ANKLE CLOSED REDUCTION Right 11/20/2019   Procedure: CLOSED REDUCTION ANKLE;  Surgeon: Mordecai Rasmussen, MD;  [facility];  Service: Orthopedics;  Laterality: Right;  . ANKLE FUSION Right 12/20/2019   Procedure: RIGHT TIBIOCALCANEAL FUSION;  Surgeon: [name], MD;  Location: [place];  Service: Orthopedics;  Laterality: Right;  . CATARACT EXTRACTION W/PHACO Right 08/21/2013   Procedure: CATARACT EXTRACTION PHACO AND INTRAOCULAR LENS PLACEMENT (IOC);  Surgeon: Tonny Branch, MD;  [facility]: [facility];  Service: Ophthalmology;  Laterality: Right;  CDE 7.34  . CATARACT EXTRACTION W/PHACO Left 09/14/2013   Procedure: CATARACT EXTRACTION PHACO AND INTRAOCULAR LENS PLACEMENT (IOC);  Surgeon: Tonny Branch, MD;  Location[facility];  Service: Ophthalmology;  Laterality: Left;  CDE: 11.50  . COLONOSCOPY  2011   Recc repeat 3 years external / anal canal hemorrhoids, otherwise normal rectum. 2. multiple colonic polyps with largest ulcerated pedunculated polyp in the midsigmoid, status post multiple  hot snare polypectomies. no evidence of colitis or diverticuliosis.   Marland Kitchen EXTERNAL FIXATION LEG Right 12/14/2019   Procedure: ATTEMPTED EXTERNAL FIXATION RIGHT ANKLE;  Surgeon: Mordecai Rasmussen, MD;  [facility];  Service: Orthopedics;  Laterality: Right;  . FRACTURE SURGERY Bilateral    ankle  . HARDWARE REMOVAL Right 11/30/2019   Procedure: HARDWARE REMOVAL; right  fibula plate and screws;  Surgeon: Mordecai Rasmussen, MD;  [facility];  Service: Orthopedics;  Laterality: Right;  . HARDWARE REMOVAL Right 12/14/2019   Procedure: HARDWARE REMOVAL OF RIGHT ANKLE;  Surgeon: Mordecai Rasmussen, MD;  [facility];  Service: Orthopedics;  Laterality: Right;  . IRRIGATION AND DEBRIDEMENT FOOT Right 12/14/2019   Procedure: IRRIGATION AND DEBRIDEMENT RIGHT ANKLE;  Surgeon: Mordecai Rasmussen, MD;  [facility];  Service: Orthopedics;  Laterality: Right;  . ORIF ANKLE FRACTURE Right 11/30/2019   Procedure: Operative Fixation of Right Ankle Fracture;  Surgeon: Mordecai Rasmussen, MD;  Location[facility];  Service: Orthopedics;  Laterality: Right;  ORIF medial mal; revision ORIF  fibula  . ORIF HUMERUS FRACTURE Left 10/14/2016   Procedure: OPEN REDUCTION INTERNAL FIXATION (ORIF) LEFT SHOULDER FRACTURE/DISLOCATION, OPEN REDUCTION INTERNAL FIXATION GREATER TUBEROSITY;  Surgeon: Marybelle Killings, MD;  Location: [place];  Service: Orthopedics;  Laterality: Left;  . PROSTATE SURGERY     radiation implants   Social History   Occupational History  . Not on file  Tobacco Use  . Smoking status: Current Every Day Smoker    Packs/day: 1.00    Years: 50.00    Pack years: 50.00    Types: Cigarettes    Start date: 08/09/1958  . Smokeless tobacco: Current User    Types: Chew  Vaping Use  . Vaping Use: Never used  Substance and Sexual Activity  . Alcohol use: Yes    Alcohol/week: 10.0 standard drinks    Types: 10 Standard drinks or equivalent per week    Comment: drinks at least once a week, minimum 6 drinks per occurrence.  . Drug use: No  . Sexual activity: Not Currently

## 2020-01-22 DIAGNOSIS — D649 Anemia, unspecified: Secondary | ICD-10-CM | POA: Diagnosis not present

## 2020-01-22 DIAGNOSIS — Z720 Tobacco use: Secondary | ICD-10-CM | POA: Diagnosis not present

## 2020-01-22 DIAGNOSIS — M6281 Muscle weakness (generalized): Secondary | ICD-10-CM | POA: Diagnosis not present

## 2020-01-22 DIAGNOSIS — R262 Difficulty in walking, not elsewhere classified: Secondary | ICD-10-CM | POA: Diagnosis not present

## 2020-01-22 DIAGNOSIS — S82853A Displaced trimalleolar fracture of unspecified lower leg, initial encounter for closed fracture: Secondary | ICD-10-CM | POA: Diagnosis not present

## 2020-01-22 DIAGNOSIS — Z4789 Encounter for other orthopedic aftercare: Secondary | ICD-10-CM | POA: Diagnosis not present

## 2020-01-22 DIAGNOSIS — S82891S Other fracture of right lower leg, sequela: Secondary | ICD-10-CM | POA: Diagnosis not present

## 2020-01-22 DIAGNOSIS — E871 Hypo-osmolality and hyponatremia: Secondary | ICD-10-CM | POA: Diagnosis not present

## 2020-01-23 DIAGNOSIS — S82891S Other fracture of right lower leg, sequela: Secondary | ICD-10-CM | POA: Diagnosis not present

## 2020-01-23 DIAGNOSIS — R262 Difficulty in walking, not elsewhere classified: Secondary | ICD-10-CM | POA: Diagnosis not present

## 2020-01-23 DIAGNOSIS — Z4789 Encounter for other orthopedic aftercare: Secondary | ICD-10-CM | POA: Diagnosis not present

## 2020-01-23 DIAGNOSIS — M6281 Muscle weakness (generalized): Secondary | ICD-10-CM | POA: Diagnosis not present

## 2020-01-24 DIAGNOSIS — R262 Difficulty in walking, not elsewhere classified: Secondary | ICD-10-CM | POA: Diagnosis not present

## 2020-01-24 DIAGNOSIS — S82891S Other fracture of right lower leg, sequela: Secondary | ICD-10-CM | POA: Diagnosis not present

## 2020-01-24 DIAGNOSIS — Z4789 Encounter for other orthopedic aftercare: Secondary | ICD-10-CM | POA: Diagnosis not present

## 2020-01-24 DIAGNOSIS — M6281 Muscle weakness (generalized): Secondary | ICD-10-CM | POA: Diagnosis not present

## 2020-01-25 DIAGNOSIS — Z4789 Encounter for other orthopedic aftercare: Secondary | ICD-10-CM | POA: Diagnosis not present

## 2020-01-25 DIAGNOSIS — M6281 Muscle weakness (generalized): Secondary | ICD-10-CM | POA: Diagnosis not present

## 2020-01-25 DIAGNOSIS — S82891S Other fracture of right lower leg, sequela: Secondary | ICD-10-CM | POA: Diagnosis not present

## 2020-01-25 DIAGNOSIS — R262 Difficulty in walking, not elsewhere classified: Secondary | ICD-10-CM | POA: Diagnosis not present

## 2020-01-26 DIAGNOSIS — M6281 Muscle weakness (generalized): Secondary | ICD-10-CM | POA: Diagnosis not present

## 2020-01-26 DIAGNOSIS — S82891S Other fracture of right lower leg, sequela: Secondary | ICD-10-CM | POA: Diagnosis not present

## 2020-01-26 DIAGNOSIS — Z4789 Encounter for other orthopedic aftercare: Secondary | ICD-10-CM | POA: Diagnosis not present

## 2020-01-26 DIAGNOSIS — R262 Difficulty in walking, not elsewhere classified: Secondary | ICD-10-CM | POA: Diagnosis not present

## 2020-01-28 DIAGNOSIS — Z4789 Encounter for other orthopedic aftercare: Secondary | ICD-10-CM | POA: Diagnosis not present

## 2020-01-28 DIAGNOSIS — J99 Respiratory disorders in diseases classified elsewhere: Secondary | ICD-10-CM | POA: Diagnosis not present

## 2020-01-28 DIAGNOSIS — R262 Difficulty in walking, not elsewhere classified: Secondary | ICD-10-CM | POA: Diagnosis not present

## 2020-01-28 DIAGNOSIS — T8092XD Unspecified transfusion reaction, subsequent encounter: Secondary | ICD-10-CM | POA: Diagnosis not present

## 2020-01-28 DIAGNOSIS — I9581 Postprocedural hypotension: Secondary | ICD-10-CM | POA: Diagnosis not present

## 2020-01-28 DIAGNOSIS — T8131XD Disruption of external operation (surgical) wound, not elsewhere classified, subsequent encounter: Secondary | ICD-10-CM | POA: Diagnosis not present

## 2020-01-28 DIAGNOSIS — E46 Unspecified protein-calorie malnutrition: Secondary | ICD-10-CM | POA: Diagnosis not present

## 2020-01-28 DIAGNOSIS — D649 Anemia, unspecified: Secondary | ICD-10-CM | POA: Diagnosis not present

## 2020-01-28 DIAGNOSIS — Z720 Tobacco use: Secondary | ICD-10-CM | POA: Diagnosis not present

## 2020-01-28 DIAGNOSIS — S92301G Fracture of unspecified metatarsal bone(s), right foot, subsequent encounter for fracture with delayed healing: Secondary | ICD-10-CM | POA: Diagnosis not present

## 2020-01-28 DIAGNOSIS — D62 Acute posthemorrhagic anemia: Secondary | ICD-10-CM | POA: Diagnosis not present

## 2020-01-28 DIAGNOSIS — E876 Hypokalemia: Secondary | ICD-10-CM | POA: Diagnosis not present

## 2020-01-28 DIAGNOSIS — E871 Hypo-osmolality and hyponatremia: Secondary | ICD-10-CM | POA: Diagnosis not present

## 2020-01-28 DIAGNOSIS — I10 Essential (primary) hypertension: Secondary | ICD-10-CM | POA: Diagnosis not present

## 2020-01-28 DIAGNOSIS — R410 Disorientation, unspecified: Secondary | ICD-10-CM | POA: Diagnosis not present

## 2020-01-28 DIAGNOSIS — M6281 Muscle weakness (generalized): Secondary | ICD-10-CM | POA: Diagnosis not present

## 2020-01-28 DIAGNOSIS — S82891S Other fracture of right lower leg, sequela: Secondary | ICD-10-CM | POA: Diagnosis not present

## 2020-01-28 DIAGNOSIS — E785 Hyperlipidemia, unspecified: Secondary | ICD-10-CM | POA: Diagnosis not present

## 2020-01-28 DIAGNOSIS — T84498S Other mechanical complication of other internal orthopedic devices, implants and grafts, sequela: Secondary | ICD-10-CM | POA: Diagnosis not present

## 2020-01-28 DIAGNOSIS — U071 COVID-19: Secondary | ICD-10-CM | POA: Diagnosis not present

## 2020-01-28 DIAGNOSIS — S2232XD Fracture of one rib, left side, subsequent encounter for fracture with routine healing: Secondary | ICD-10-CM | POA: Diagnosis not present

## 2020-01-28 DIAGNOSIS — S82851S Displaced trimalleolar fracture of right lower leg, sequela: Secondary | ICD-10-CM | POA: Diagnosis not present

## 2020-02-02 DIAGNOSIS — R410 Disorientation, unspecified: Secondary | ICD-10-CM | POA: Diagnosis not present

## 2020-02-02 DIAGNOSIS — U071 COVID-19: Secondary | ICD-10-CM | POA: Diagnosis not present

## 2020-02-05 ENCOUNTER — Ambulatory Visit: Payer: Medicare Other | Admitting: Physician Assistant

## 2020-02-06 DIAGNOSIS — D649 Anemia, unspecified: Secondary | ICD-10-CM | POA: Diagnosis not present

## 2020-02-06 DIAGNOSIS — E871 Hypo-osmolality and hyponatremia: Secondary | ICD-10-CM | POA: Diagnosis not present

## 2020-02-26 DIAGNOSIS — Z4789 Encounter for other orthopedic aftercare: Secondary | ICD-10-CM | POA: Diagnosis not present

## 2020-02-26 DIAGNOSIS — R262 Difficulty in walking, not elsewhere classified: Secondary | ICD-10-CM | POA: Diagnosis not present

## 2020-02-26 DIAGNOSIS — M6281 Muscle weakness (generalized): Secondary | ICD-10-CM | POA: Diagnosis not present

## 2020-02-26 DIAGNOSIS — U071 COVID-19: Secondary | ICD-10-CM | POA: Diagnosis not present

## 2020-02-26 DIAGNOSIS — R310 Gross hematuria: Secondary | ICD-10-CM | POA: Diagnosis not present

## 2020-02-27 DIAGNOSIS — I10 Essential (primary) hypertension: Secondary | ICD-10-CM | POA: Diagnosis not present

## 2020-02-27 DIAGNOSIS — N390 Urinary tract infection, site not specified: Secondary | ICD-10-CM | POA: Diagnosis not present

## 2020-02-28 ENCOUNTER — Ambulatory Visit: Payer: Medicare Other | Admitting: Physician Assistant

## 2020-02-29 DIAGNOSIS — S82853A Displaced trimalleolar fracture of unspecified lower leg, initial encounter for closed fracture: Secondary | ICD-10-CM | POA: Diagnosis not present

## 2020-02-29 DIAGNOSIS — U071 COVID-19: Secondary | ICD-10-CM | POA: Diagnosis not present

## 2020-02-29 DIAGNOSIS — E785 Hyperlipidemia, unspecified: Secondary | ICD-10-CM | POA: Diagnosis not present

## 2020-03-14 ENCOUNTER — Ambulatory Visit (INDEPENDENT_AMBULATORY_CARE_PROVIDER_SITE_OTHER): Payer: Medicare Other | Admitting: Physician Assistant

## 2020-03-14 ENCOUNTER — Encounter: Payer: Self-pay | Admitting: Orthopedic Surgery

## 2020-03-14 VITALS — Ht 67.0 in | Wt 141.0 lb

## 2020-03-14 DIAGNOSIS — S82891S Other fracture of right lower leg, sequela: Secondary | ICD-10-CM

## 2020-03-14 NOTE — Progress Notes (Signed)
Office Visit Note   Patient: Lee Wang           Date of Birth: 05-13-1945           MRN: [id number] Visit Date: 03/14/2020              Requested by: No referring provider defined for this encounter. PCP: Patient, No Pcp Per  Chief Complaint  Patient presents with  . Right Ankle - Follow-up    Right tibiocalcaneal fusion 12/20/2019      HPI: Patient presents today 2-1/23-month status post right tibial calcaneal fusion he is in skilled care facility.  He does have some pain but is trying to walk in his boot  Assessment & Plan: Visit Diagnoses: No diagnosis found.  Plan: Follow-up for final visit in 1 month should have x-rays of his ankle at that time continue with physical therapy and progressing ambulation in the boot  Follow-Up Instructions: No follow-ups on file.   Ortho Exam  Patient is alert, oriented, no adenopathy, well-dressed, normal affect, normal respiratory effort. Examination of the ankle demonstrates well-healed surgical incision.  He has a strong palpable dorsalis pedis pulse.  Swelling is well controlled compartments are soft and nontender rigid ankle and subtalar range of motion no signs of infection  Imaging: No results found. No images are attached to the encounter.  Labs: Lab Results  Component Value Date   REPTSTATUS 12/25/2019 FINAL 12/20/2019   CULT  12/20/2019    NO GROWTH 5 DAYS Performed at [facility], [place] [address], [place], [place] [address]      Lab Results  Component Value Date   ALBUMIN 2.6 (L) 12/24/2019   ALBUMIN 2.5 (L) 12/23/2019   ALBUMIN 2.7 (L) 12/22/2019    Lab Results  Component Value Date   MG 2.1 12/26/2019   MG 1.8 12/25/2019   MG 1.7 12/24/2019   Lab Results  Component Value Date   VD25OH 20.9 (L) 10/31/2012    No results found for: PREALBUMIN CBC EXTENDED Latest Ref Rng & Units 12/26/2019 12/25/2019 12/24/2019  WBC 4.0 - 10.5 K/uL 4.4 4.7 6.6  RBC 4.22 - 5.81 MIL/uL 2.84(L) 2.91(L)  2.84(L)  HGB 13.0 - 17.0 g/dL 9.1(L) 9.4(L) 9.0(L)  HCT 39.0 - 52.0 % 27.6(L) 28.2(L) 26.6(L)  PLT 150 - 400 K/uL 269 289 243  NEUTROABS 1.7 - 7.7 K/uL 2.4 2.9 4.6  LYMPHSABS 0.7 - 4.0 K/uL 1.1 1.0 1.1     Body mass index is 22.08 kg/m.  Orders:  No orders of the defined types were placed in this encounter.  No orders of the defined types were placed in this encounter.    Procedures: No procedures performed  Clinical Data: No additional findings.  ROS:  All other systems negative, except as noted in the HPI. Review of Systems  Objective: Vital Signs: Ht 5\' 7"  (1.702 m)   Wt 141 lb (64 kg)   BMI 22.08 kg/m   Specialty Comments:  No specialty comments available.  PMFS History: Patient Active Problem List   Diagnosis Date Noted  . Postprocedural hypotension   . Shock ([place])   . Rupture of operation wound   . Closed right ankle fracture, sequela 12/13/2019  . Exposed orthopaedic hardware ([place]) 12/13/2019  . Avulsion fracture of metatarsal bone of right foot with delayed healing 11/20/2019  . Left rib fracture 11/20/2019  . Hyponatremia 11/20/2019  . Alcohol dependence ([place]) 11/20/2019  . Closed right ankle fracture 11/20/2019  . Tobacco  abuse 11/20/2019  . Closed trimalleolar fracture of right ankle   . Greater tuberosity of humerus fracture 10/14/2016  . Closed fracture dislocation of joint of left shoulder girdle 10/08/2016  . GAD (generalized anxiety disorder) 03/09/2015  . Elevated LFTs 11/05/2014  . Abnormal [place] (ultrasound) of abdomen 11/05/2014  . Hepatomegaly 11/05/2014  . History of colonic polyps 11/05/2014  . HTN (hypertension) 05/28/2010  . Hyperlipemia 05/28/2010  . Depression with anxiety 05/28/2010  . Prostate CA ([place]) 05/28/2010  . BPH (benign prostatic hyperplasia) 05/28/2010  . Alcohol abuse 07/18/2009  . HEMATOCHEZIA 07/18/2009  . DIARRHEA 07/18/2009  . ABDOMINAL PAIN, UNSPECIFIED SITE 07/18/2009   Past Medical History:  Diagnosis  Date  . Anxiety   . Arthritis   . Cancer [facility]) prostate   2013  . Depression   . Heavy drinker of alcohol   . Hyperlipidemia   . Hypertension     Family History  Problem Relation Age of Onset  . Alzheimer's disease Mother   . Heart disease Father   . Alzheimer's disease Paternal Grandfather   . Colon cancer Neg Hx   . Liver disease Neg Hx     Past Surgical History:  Procedure Laterality Date  . ANKLE CLOSED REDUCTION Right 11/20/2019   Procedure: CLOSED REDUCTION ANKLE;  Surgeon: Mordecai Rasmussen, MD;  [facility];  Service: Orthopedics;  Laterality: Right;  . ANKLE FUSION Right 12/20/2019   Procedure: RIGHT TIBIOCALCANEAL FUSION;  Surgeon: [name], MD;  Location: [place];  Service: Orthopedics;  Laterality: Right;  . CATARACT EXTRACTION W/PHACO Right 08/21/2013   Procedure: CATARACT EXTRACTION PHACO AND INTRAOCULAR LENS PLACEMENT (IOC);  Surgeon: Tonny Branch, MD;  [facility];  Service: Ophthalmology;  Laterality: Right;  CDE 7.34  . CATARACT EXTRACTION W/PHACO Left 09/14/2013   Procedure: CATARACT EXTRACTION PHACO AND INTRAOCULAR LENS PLACEMENT (IOC);  Surgeon: Tonny Branch, MD;  [facility];  Service: Ophthalmology;  Laterality: Left;  CDE: 11.50  . COLONOSCOPY  2011   Recc repeat 3 years external / anal canal hemorrhoids, otherwise normal rectum. 2. multiple colonic polyps with largest ulcerated pedunculated polyp in the midsigmoid, status post multiple  hot snare polypectomies. no evidence of colitis or diverticuliosis.   Marland Kitchen EXTERNAL FIXATION LEG Right 12/14/2019   Procedure: ATTEMPTED EXTERNAL FIXATION RIGHT ANKLE;  Surgeon: Mordecai Rasmussen, MD;  [facility];  Service: Orthopedics;  Laterality: Right;  . FRACTURE SURGERY Bilateral    ankle  . HARDWARE REMOVAL Right 11/30/2019   Procedure: HARDWARE REMOVAL; right fibula plate and screws;  Surgeon: Mordecai Rasmussen, MD;  [facility];  Service: Orthopedics;  Laterality: Right;  . HARDWARE REMOVAL Right  12/14/2019   Procedure: HARDWARE REMOVAL OF RIGHT ANKLE;  Surgeon: Mordecai Rasmussen, MD;  [facility];  Service: Orthopedics;  Laterality: Right;  . IRRIGATION AND DEBRIDEMENT FOOT Right 12/14/2019   Procedure: IRRIGATION AND DEBRIDEMENT RIGHT ANKLE;  Surgeon: Mordecai Rasmussen, MD;  [facility];  Service: Orthopedics;  Laterality: Right;  . ORIF ANKLE FRACTURE Right 11/30/2019   Procedure: Operative Fixation of Right Ankle Fracture;  Surgeon: Mordecai Rasmussen, MD;  [facility];  Service: Orthopedics;  Laterality: Right;  ORIF medial mal; revision ORIF fibula  . ORIF HUMERUS FRACTURE Left 10/14/2016   Procedure: OPEN REDUCTION INTERNAL FIXATION (ORIF) LEFT SHOULDER FRACTURE/DISLOCATION, OPEN REDUCTION INTERNAL FIXATION GREATER TUBEROSITY;  Surgeon: Marybelle Killings, MD;  Location: [place];  Service: Orthopedics;  Laterality: Left;  . PROSTATE SURGERY  radiation implants   Social History   Occupational History  . Not on file  Tobacco Use  . Smoking status: Current Every Day Smoker    Packs/day: 1.00    Years: 50.00    Pack years: 50.00    Types: Cigarettes    Start date: 08/09/1958  . Smokeless tobacco: Current User    Types: Chew  Vaping Use  . Vaping Use: Never used  Substance and Sexual Activity  . Alcohol use: Yes    Alcohol/week: 10.0 standard drinks    Types: 10 Standard drinks or equivalent per week    Comment: drinks at least once a week, minimum 6 drinks per occurrence.  . Drug use: No  . Sexual activity: Not Currently

## 2020-03-19 DIAGNOSIS — B351 Tinea unguium: Secondary | ICD-10-CM | POA: Diagnosis not present

## 2020-03-19 DIAGNOSIS — I7091 Generalized atherosclerosis: Secondary | ICD-10-CM | POA: Diagnosis not present

## 2020-03-19 DIAGNOSIS — L603 Nail dystrophy: Secondary | ICD-10-CM | POA: Diagnosis not present

## 2020-03-29 DIAGNOSIS — I10 Essential (primary) hypertension: Secondary | ICD-10-CM | POA: Diagnosis not present

## 2020-04-02 DIAGNOSIS — Z961 Presence of intraocular lens: Secondary | ICD-10-CM | POA: Diagnosis not present

## 2020-04-10 ENCOUNTER — Ambulatory Visit: Payer: Medicare Other | Admitting: Physician Assistant

## 2020-04-18 ENCOUNTER — Ambulatory Visit (INDEPENDENT_AMBULATORY_CARE_PROVIDER_SITE_OTHER): Payer: Medicare Other | Admitting: Physician Assistant

## 2020-04-18 ENCOUNTER — Ambulatory Visit: Payer: Self-pay

## 2020-04-18 ENCOUNTER — Encounter: Payer: Self-pay | Admitting: Orthopedic Surgery

## 2020-04-18 DIAGNOSIS — M25571 Pain in right ankle and joints of right foot: Secondary | ICD-10-CM

## 2020-04-18 NOTE — Progress Notes (Signed)
Office Visit Note   Patient: Lee Wang           Date of Birth: [birth year]           MRN: [id number] Visit Date: 04/18/2020              Requested by: No referring provider defined for this encounter. PCP: Patient, No Pcp Per  Chief Complaint  Patient presents with   Right Ankle - Pain      HPI: Patient presents today he is 4 months status post right tibial calcaneal fusion.  He is at a long-term care facility.  He is doing well and states he is ambulating with his boot.  Assessment & Plan: Visit Diagnoses:  1. Pain in right ankle and joints of right foot     Plan: Patient may follow-up with [place] as needed.  He should wean out of the boot and progress weightbearing  Follow-Up Instructions: No follow-ups on file.   Ortho Exam  Patient is alert, oriented, no adenopathy, well-dressed, normal affect, normal respiratory effort. Examination of his right ankle demonstrates well-healed surgical incision with no drainage no erythema minimal swelling subtalar and ankle range of motion are rigid as would be expected.  Pulses are palpable compartments soft and compressible no signs of infection  Imaging: No results found. No images are attached to the encounter.  Labs: Lab Results  Component Value Date   REPTSTATUS 12/25/2019 FINAL 12/20/2019   CULT  12/20/2019    NO GROWTH 5 DAYS Performed at [facility], [place] [address], [place], [place] [address]      Lab Results  Component Value Date   ALBUMIN 2.6 (L) 12/24/2019   ALBUMIN 2.5 (L) 12/23/2019   ALBUMIN 2.7 (L) 12/22/2019    Lab Results  Component Value Date   MG 2.1 12/26/2019   MG 1.8 12/25/2019   MG 1.7 12/24/2019   Lab Results  Component Value Date   VD25OH 20.9 (L) 10/31/2012    No results found for: PREALBUMIN CBC EXTENDED Latest Ref Rng & Units 12/26/2019 12/25/2019 12/24/2019  WBC 4.0 - 10.5 K/uL 4.4 4.7 6.6  RBC 4.22 - 5.81 MIL/uL 2.84(L) 2.91(L) 2.84(L)  HGB 13.0 - 17.0 g/dL 9.1(L)  9.4(L) 9.0(L)  HCT 39.0 - 52.0 % 27.6(L) 28.2(L) 26.6(L)  PLT 150 - 400 K/uL 269 289 243  NEUTROABS 1.7 - 7.7 K/uL 2.4 2.9 4.6  LYMPHSABS 0.7 - 4.0 K/uL 1.1 1.0 1.1     There is no height or weight on file to calculate BMI.  Orders:  Orders Placed This Encounter  Procedures   XR Ankle Complete Right   No orders of the defined types were placed in this encounter.    Procedures: No procedures performed  Clinical Data: No additional findings.  ROS:  All other systems negative, except as noted in the HPI. Review of Systems  Objective: Vital Signs: There were no vitals taken for this visit.  Specialty Comments:  No specialty comments available.  PMFS History: Patient Active Problem List   Diagnosis Date Noted   Postprocedural hypotension    Shock ([place])    Rupture of operation wound    Closed right ankle fracture, sequela 12/13/2019   Exposed orthopaedic hardware ([place]) 12/13/2019   Avulsion fracture of metatarsal bone of right foot with delayed healing 11/20/2019   Left rib fracture 11/20/2019   Hyponatremia 11/20/2019   Alcohol dependence ([place]) 11/20/2019   Closed right ankle fracture 11/20/2019   Tobacco abuse  11/20/2019   Closed trimalleolar fracture of right ankle    Greater tuberosity of humerus fracture 10/14/2016   Closed fracture dislocation of joint of left shoulder girdle 10/08/2016   GAD (generalized anxiety disorder) 03/09/2015   Elevated LFTs 11/05/2014   Abnormal [place] (ultrasound) of abdomen 11/05/2014   Hepatomegaly 11/05/2014   History of colonic polyps 11/05/2014   HTN (hypertension) 05/28/2010   Hyperlipemia 05/28/2010   Depression with anxiety 05/28/2010   Prostate CA ([place]) 05/28/2010   BPH (benign prostatic hyperplasia) 05/28/2010   Alcohol abuse 07/18/2009   HEMATOCHEZIA 07/18/2009   DIARRHEA 07/18/2009   ABDOMINAL PAIN, UNSPECIFIED SITE 07/18/2009   Past Medical History:  Diagnosis Date   Anxiety     Arthritis    Cancer ([place]) prostate   2013   Depression    Heavy drinker of alcohol    Hyperlipidemia    Hypertension     Family History  Problem Relation Age of Onset   Alzheimer's disease Mother    Heart disease Father    Alzheimer's disease Paternal Grandfather    Colon cancer Neg Hx    Liver disease Neg Hx     Past Surgical History:  Procedure Laterality Date   ANKLE CLOSED REDUCTION Right 11/20/2019   Procedure: CLOSED REDUCTION ANKLE;  Surgeon: Mordecai Rasmussen, MD;  Location: [facility];  Service: Orthopedics;  Laterality: Right;   ANKLE FUSION Right 12/20/2019   Procedure: RIGHT TIBIOCALCANEAL FUSION;  Surgeon: [name], MD;  Location: [facility];  Service: Orthopedics;  Laterality: Right;   CATARACT EXTRACTION W/PHACO Right 08/21/2013   Procedure: CATARACT EXTRACTION PHACO AND INTRAOCULAR LENS PLACEMENT (IOC);  Surgeon: Tonny Branch, MD;  Location: [facility];  Service: Ophthalmology;  Laterality: Right;  CDE 7.34   CATARACT EXTRACTION W/PHACO Left 09/14/2013   Procedure: CATARACT EXTRACTION PHACO AND INTRAOCULAR LENS PLACEMENT (IOC);  Surgeon: Tonny Branch, MD;  Location: [facility];  Service: Ophthalmology;  Laterality: Left;  CDE: 11.50   COLONOSCOPY  2011   Recc repeat 3 years external / anal canal hemorrhoids, otherwise normal rectum. 2. multiple colonic polyps with largest ulcerated pedunculated polyp in the midsigmoid, status post multiple  hot snare polypectomies. no evidence of colitis or diverticuliosis.    EXTERNAL FIXATION LEG Right 12/14/2019   Procedure: ATTEMPTED EXTERNAL FIXATION RIGHT ANKLE;  Surgeon: Mordecai Rasmussen, MD;  Location: [facility];  Service: Orthopedics;  Laterality: Right;   FRACTURE SURGERY Bilateral    ankle   HARDWARE REMOVAL Right 11/30/2019   Procedure: HARDWARE REMOVAL; right fibula plate and screws;  Surgeon: Mordecai Rasmussen, MD;  Location: [facility];  Service: Orthopedics;  Laterality: Right;   HARDWARE REMOVAL Right 12/14/2019   Procedure:  HARDWARE REMOVAL OF RIGHT ANKLE;  Surgeon: Mordecai Rasmussen, MD;  Location: [facility];  Service: Orthopedics;  Laterality: Right;   IRRIGATION AND DEBRIDEMENT FOOT Right 12/14/2019   Procedure: IRRIGATION AND DEBRIDEMENT RIGHT ANKLE;  Surgeon: Mordecai Rasmussen, MD;  Location: [facility];  Service: Orthopedics;  Laterality: Right;   ORIF ANKLE FRACTURE Right 11/30/2019   Procedure: Operative Fixation of Right Ankle Fracture;  Surgeon: Mordecai Rasmussen, MD;  Location: [facility];  Service: Orthopedics;  Laterality: Right;  ORIF medial mal; revision ORIF fibula   ORIF HUMERUS FRACTURE Left 10/14/2016   Procedure: OPEN REDUCTION INTERNAL FIXATION (ORIF) LEFT SHOULDER FRACTURE/DISLOCATION, OPEN REDUCTION INTERNAL FIXATION GREATER TUBEROSITY;  Surgeon: Marybelle Killings, MD;  Location: [facility];  Service: Orthopedics;  Laterality: Left;   PROSTATE SURGERY  radiation implants   Social History   Occupational History   Not on file  Tobacco Use   Smoking status: Current Every Day Smoker    Packs/day: 1.00    Years: 50.00    Pack years: 50.00    Types: Cigarettes    Start date: 08/09/1958   Smokeless tobacco: Current User    Types: Chew  Vaping Use   Vaping Use: Never used  Substance and Sexual Activity   Alcohol use: Yes    Alcohol/week: 10.0 standard drinks    Types: 10 Standard drinks or equivalent per week    Comment: drinks at least once a week, minimum 6 drinks per occurrence.   Drug use: No   Sexual activity: Not Currently

## 2020-04-22 DIAGNOSIS — Z720 Tobacco use: Secondary | ICD-10-CM | POA: Diagnosis not present

## 2020-04-22 DIAGNOSIS — E871 Hypo-osmolality and hyponatremia: Secondary | ICD-10-CM | POA: Diagnosis not present

## 2020-04-22 DIAGNOSIS — E785 Hyperlipidemia, unspecified: Secondary | ICD-10-CM | POA: Diagnosis not present

## 2020-04-22 DIAGNOSIS — D649 Anemia, unspecified: Secondary | ICD-10-CM | POA: Diagnosis not present

## 2020-04-22 DIAGNOSIS — S82851S Displaced trimalleolar fracture of right lower leg, sequela: Secondary | ICD-10-CM | POA: Diagnosis not present

## 2020-06-04 DIAGNOSIS — I7091 Generalized atherosclerosis: Secondary | ICD-10-CM | POA: Diagnosis not present

## 2020-06-04 DIAGNOSIS — L603 Nail dystrophy: Secondary | ICD-10-CM | POA: Diagnosis not present

## 2020-06-04 DIAGNOSIS — B351 Tinea unguium: Secondary | ICD-10-CM | POA: Diagnosis not present

## 2020-06-20 DIAGNOSIS — G4700 Insomnia, unspecified: Secondary | ICD-10-CM | POA: Diagnosis not present

## 2020-06-20 DIAGNOSIS — D649 Anemia, unspecified: Secondary | ICD-10-CM | POA: Diagnosis not present

## 2020-06-20 DIAGNOSIS — E785 Hyperlipidemia, unspecified: Secondary | ICD-10-CM | POA: Diagnosis not present

## 2020-07-02 DIAGNOSIS — M25512 Pain in left shoulder: Secondary | ICD-10-CM | POA: Diagnosis not present

## 2020-07-21 DIAGNOSIS — H04123 Dry eye syndrome of bilateral lacrimal glands: Secondary | ICD-10-CM | POA: Diagnosis not present

## 2020-08-06 DIAGNOSIS — L603 Nail dystrophy: Secondary | ICD-10-CM | POA: Diagnosis not present

## 2020-08-07 DIAGNOSIS — M6281 Muscle weakness (generalized): Secondary | ICD-10-CM | POA: Diagnosis not present

## 2020-09-10 DIAGNOSIS — Z720 Tobacco use: Secondary | ICD-10-CM | POA: Diagnosis not present

## 2020-09-10 DIAGNOSIS — N62 Hypertrophy of breast: Secondary | ICD-10-CM | POA: Diagnosis not present

## 2020-09-10 DIAGNOSIS — N644 Mastodynia: Secondary | ICD-10-CM | POA: Diagnosis not present

## 2020-09-10 DIAGNOSIS — R600 Localized edema: Secondary | ICD-10-CM | POA: Diagnosis not present

## 2020-09-12 DIAGNOSIS — R079 Chest pain, unspecified: Secondary | ICD-10-CM | POA: Diagnosis not present

## 2020-09-12 DIAGNOSIS — D179 Benign lipomatous neoplasm, unspecified: Secondary | ICD-10-CM | POA: Diagnosis not present

## 2020-09-20 DIAGNOSIS — N63 Unspecified lump in unspecified breast: Secondary | ICD-10-CM | POA: Diagnosis not present

## 2020-09-20 DIAGNOSIS — N62 Hypertrophy of breast: Secondary | ICD-10-CM | POA: Diagnosis not present

## 2020-10-01 DIAGNOSIS — N62 Hypertrophy of breast: Secondary | ICD-10-CM | POA: Diagnosis not present

## 2020-10-16 DIAGNOSIS — I209 Angina pectoris, unspecified: Secondary | ICD-10-CM | POA: Diagnosis not present

## 2020-10-16 DIAGNOSIS — R079 Chest pain, unspecified: Secondary | ICD-10-CM | POA: Diagnosis not present

## 2020-10-16 DIAGNOSIS — R931 Abnormal findings on diagnostic imaging of heart and coronary circulation: Secondary | ICD-10-CM | POA: Diagnosis not present

## 2020-10-22 DIAGNOSIS — N62 Hypertrophy of breast: Secondary | ICD-10-CM | POA: Diagnosis not present

## 2020-10-22 DIAGNOSIS — N644 Mastodynia: Secondary | ICD-10-CM | POA: Diagnosis not present

## 2020-10-22 DIAGNOSIS — R928 Other abnormal and inconclusive findings on diagnostic imaging of breast: Secondary | ICD-10-CM | POA: Diagnosis not present

## 2020-10-22 DIAGNOSIS — N63 Unspecified lump in unspecified breast: Secondary | ICD-10-CM | POA: Diagnosis not present

## 2021-02-24 DIAGNOSIS — N390 Urinary tract infection, site not specified: Secondary | ICD-10-CM | POA: Diagnosis not present

## 2021-02-24 DIAGNOSIS — K7460 Unspecified cirrhosis of liver: Secondary | ICD-10-CM | POA: Diagnosis not present

## 2021-02-24 DIAGNOSIS — K4090 Unilateral inguinal hernia, without obstruction or gangrene, not specified as recurrent: Secondary | ICD-10-CM | POA: Diagnosis not present

## 2021-02-24 DIAGNOSIS — R319 Hematuria, unspecified: Secondary | ICD-10-CM | POA: Diagnosis not present

## 2021-02-24 DIAGNOSIS — N3289 Other specified disorders of bladder: Secondary | ICD-10-CM | POA: Diagnosis not present

## 2021-03-12 DIAGNOSIS — R627 Adult failure to thrive: Secondary | ICD-10-CM | POA: Diagnosis not present

## 2021-03-12 DIAGNOSIS — K625 Hemorrhage of anus and rectum: Secondary | ICD-10-CM | POA: Diagnosis not present

## 2021-03-19 DIAGNOSIS — K625 Hemorrhage of anus and rectum: Secondary | ICD-10-CM | POA: Diagnosis not present

## 2021-03-31 DIAGNOSIS — R319 Hematuria, unspecified: Secondary | ICD-10-CM | POA: Diagnosis not present

## 2021-06-09 DIAGNOSIS — K7460 Unspecified cirrhosis of liver: Secondary | ICD-10-CM | POA: Diagnosis not present

## 2021-06-09 DIAGNOSIS — Z0000 Encounter for general adult medical examination without abnormal findings: Secondary | ICD-10-CM | POA: Diagnosis not present

## 2021-06-09 DIAGNOSIS — I10 Essential (primary) hypertension: Secondary | ICD-10-CM | POA: Diagnosis not present

## 2021-06-09 DIAGNOSIS — R319 Hematuria, unspecified: Secondary | ICD-10-CM | POA: Diagnosis not present

## 2022-03-12 DEATH — deceased

## 2022-04-09 ENCOUNTER — Encounter: Payer: Self-pay | Admitting: Radiology

## 2022-06-26 IMAGING — DX DG ANKLE COMPLETE 3+V*R*
3 series · 3 of 3 positions shown · non-contrast
Comparison: None.

CLINICAL DATA: Pain

EXAM:
RIGHT ANKLE - COMPLETE 3+ VIEW

[ankle ap]
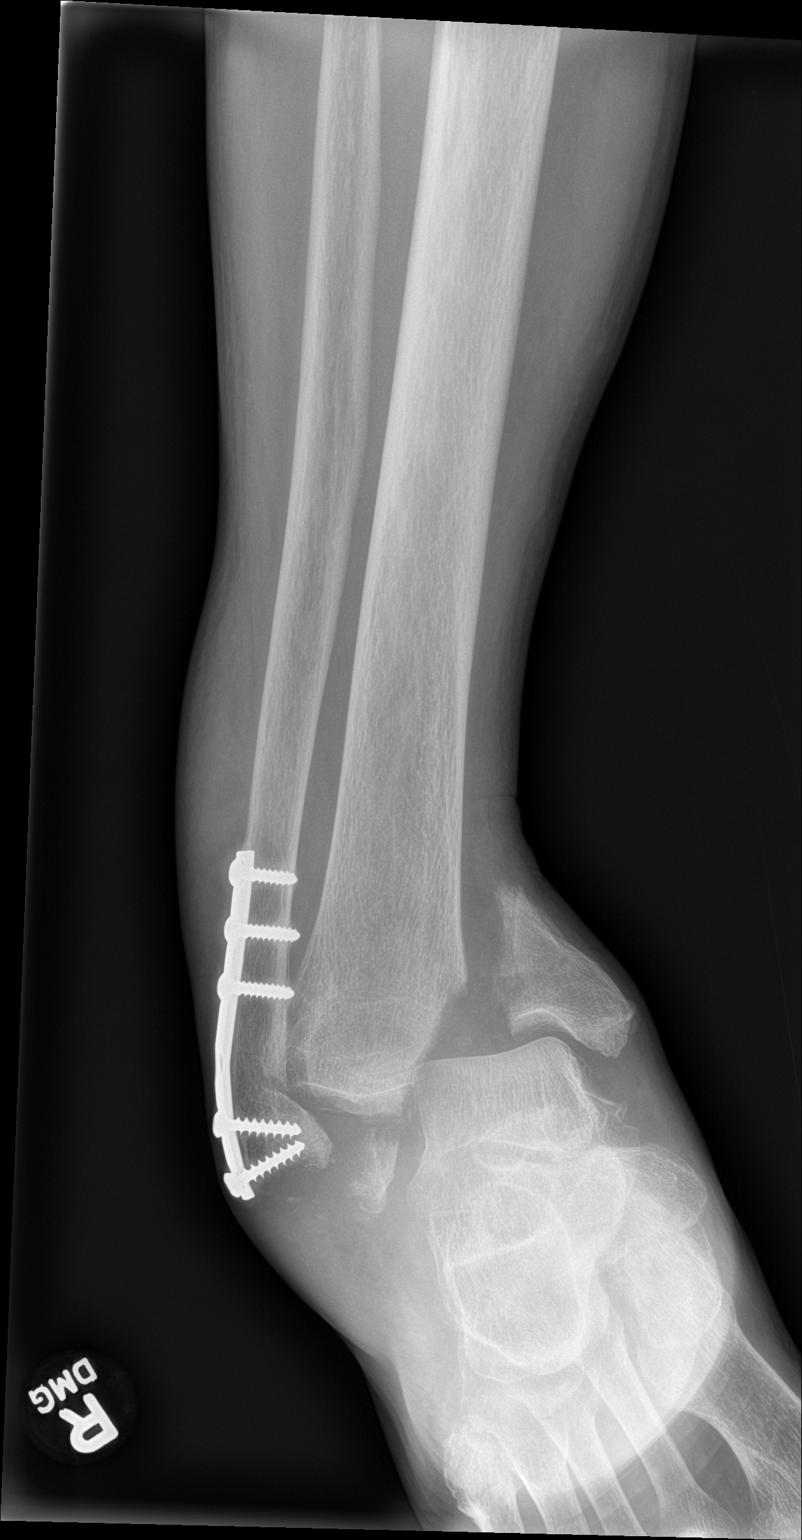

[ankle obl]
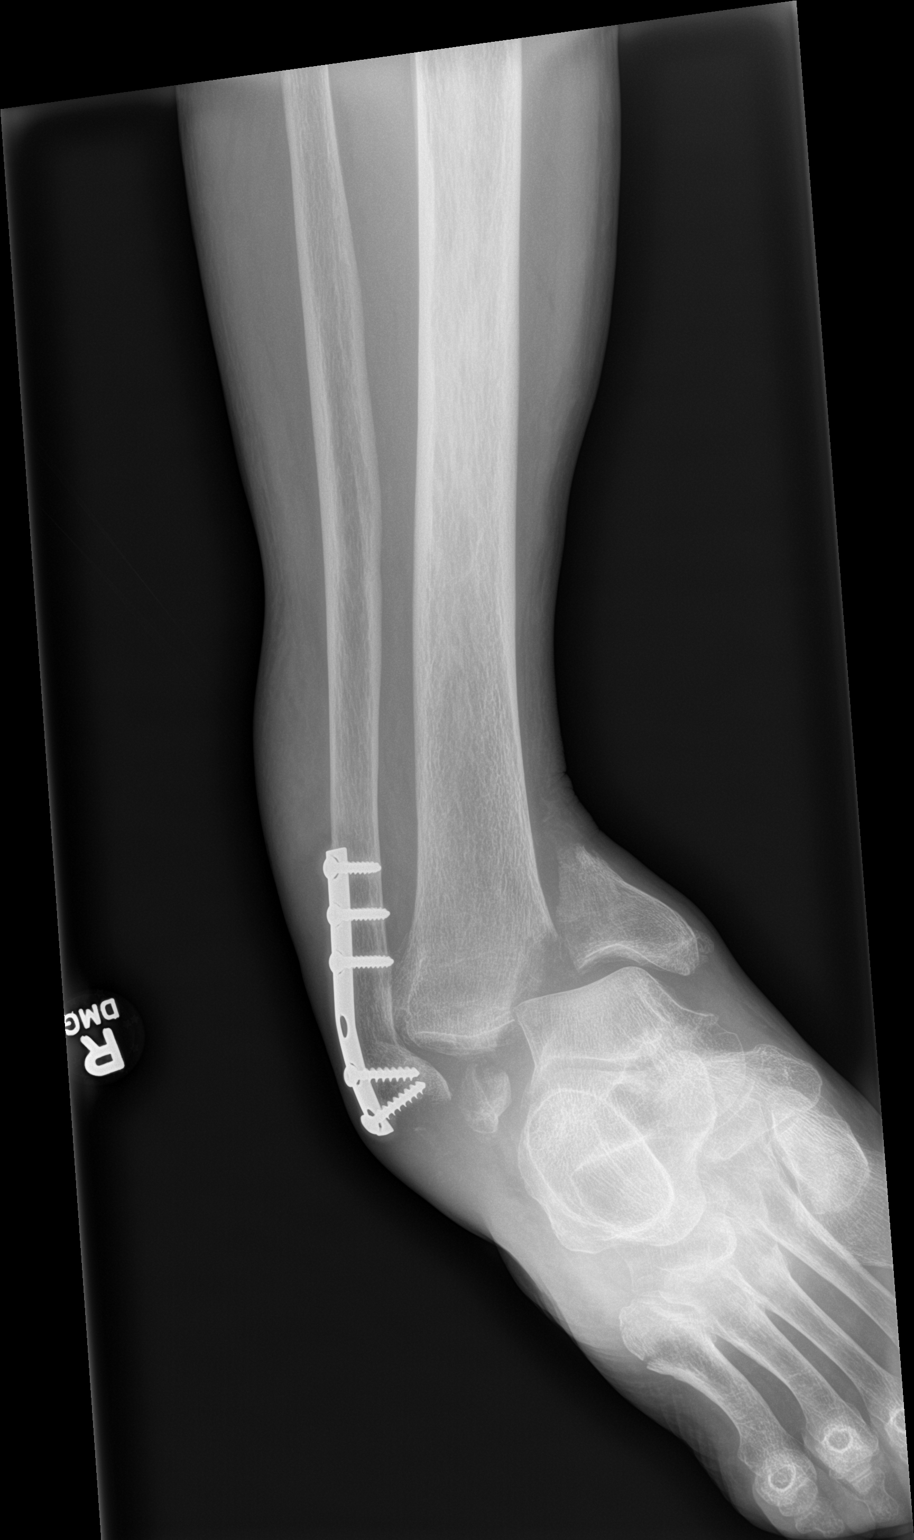

[ankle lat]
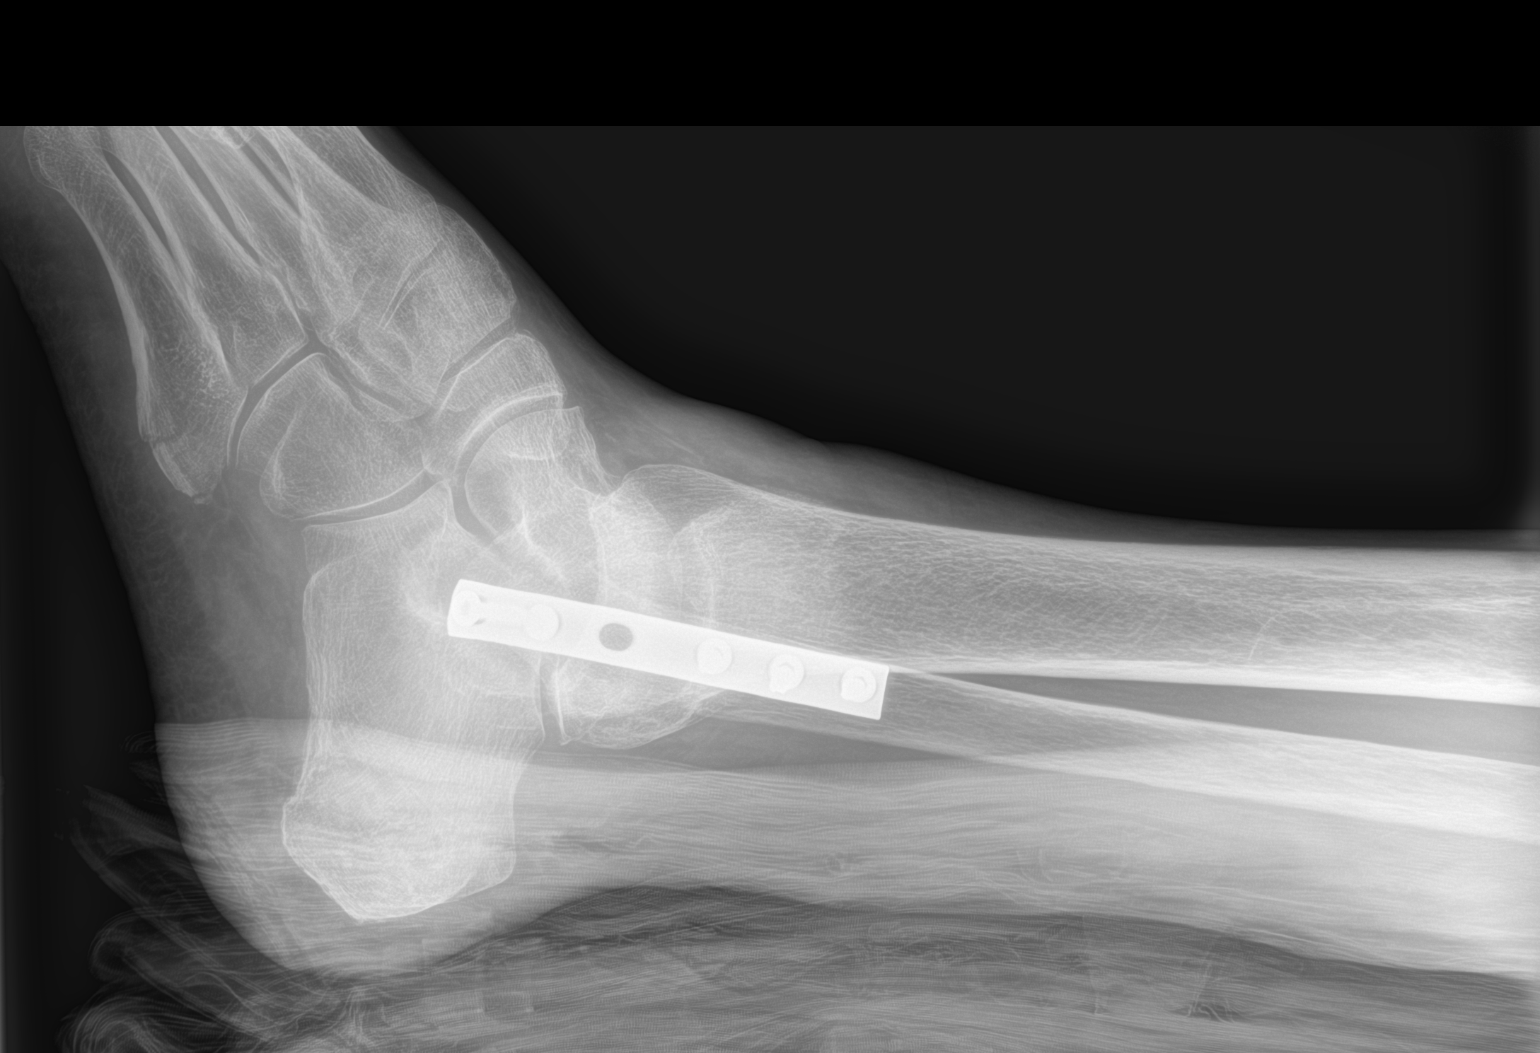

[3 of 3 positions shown; findings below may reference images not displayed]

FINDINGS: There is an acute displaced trimalleolar fracture with disruption of
the ankle mortise. There is significant medial displacement of the
talus with respect to the distal tibia. There is a large cyst
displaced fracture fragment measuring 4.5 cm involving the medial
malleolus. There may be a subtle impaction fracture involving the
lateral articular surface of the talus. There is extensive
surrounding soft tissue swelling. The patient's prior plate and
screw fixation of the distal fibula is bent. There is an acute
fracture through the base of the fifth metatarsal. The posterior
calcaneus is suboptimally evaluated secondary to overlapping objects
external to the patient.
IMPRESSION: 1. Fracture dislocation of the ankle as detailed above.
2. Acute fracture through the base of the fifth metatarsal is
favored to represent an avulsion fracture, however dedicated foot
radiographs are recommended.

## 2022-06-26 IMAGING — DX DG CHEST 1V PORT
1 series · 1 of 1 positions shown · non-contrast
Comparison: None.

CLINICAL DATA: Fall

EXAM:
PORTABLE CHEST 1 VIEW

[chest ap]
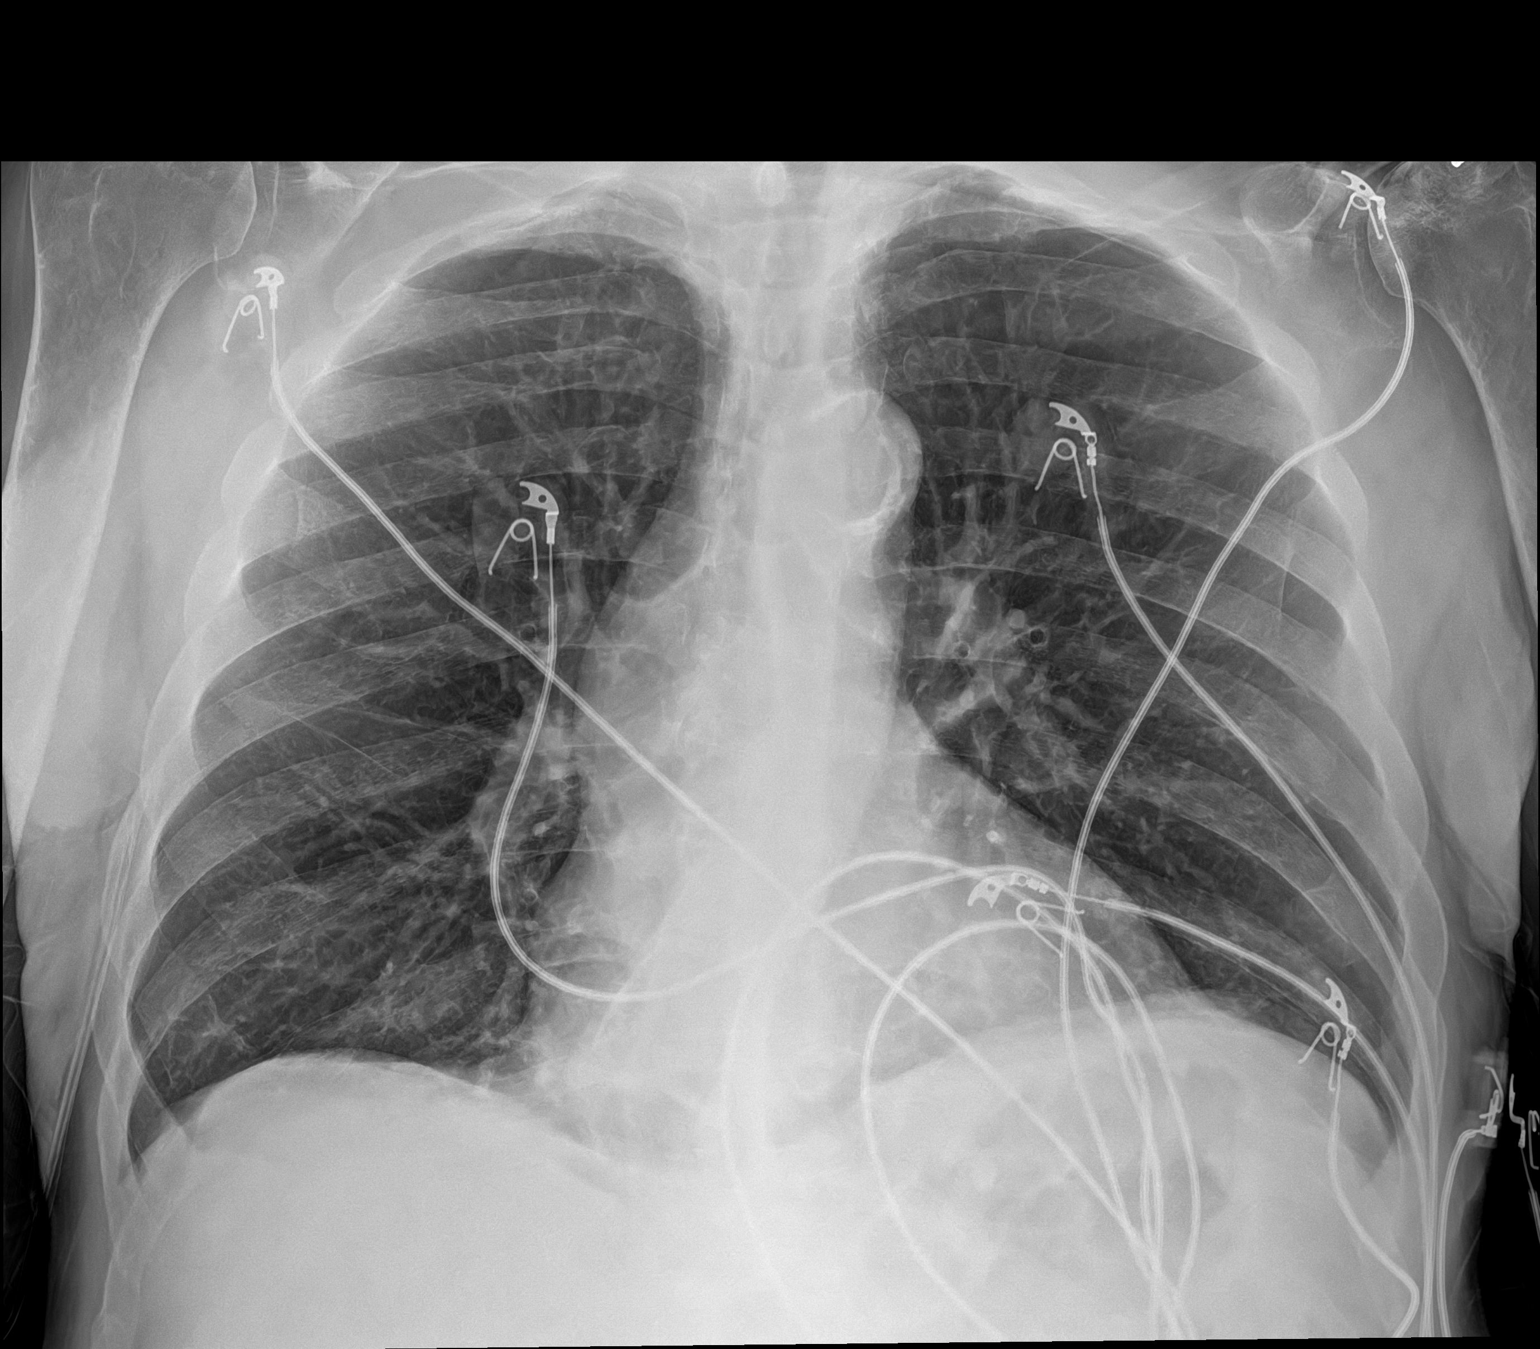

[1 of 1 positions shown; findings below may reference images not displayed]

FINDINGS: Lungs are clear. No pneumothorax or pleural effusion. Cardiac size
within normal limits. Pulmonary vascularity is normal. There are
probable acute fractures of the left 6, 7, and 8 ribs
posterolaterally demonstrating minimal displacement.
IMPRESSION: Acute left rib fractures.  No pneumothorax.

## 2022-07-21 IMAGING — RF DG ANKLE COMPLETE 3+V*R*
1 series · 5 of 5 positions shown · non-contrast
Comparison: Radiographs December 13, 2019.

CLINICAL DATA: Removal of hardware in attempted external fixation.

EXAM:
DG C-ARM 1-60 MIN; RIGHT ANKLE - COMPLETE 3+ VIEW
FLUOROSCOPY TIME:  Fluoroscopy Time:  1 minutes 19 seconds

[Series 1: run · 5 of 5 slices shown]
[im 1/5]
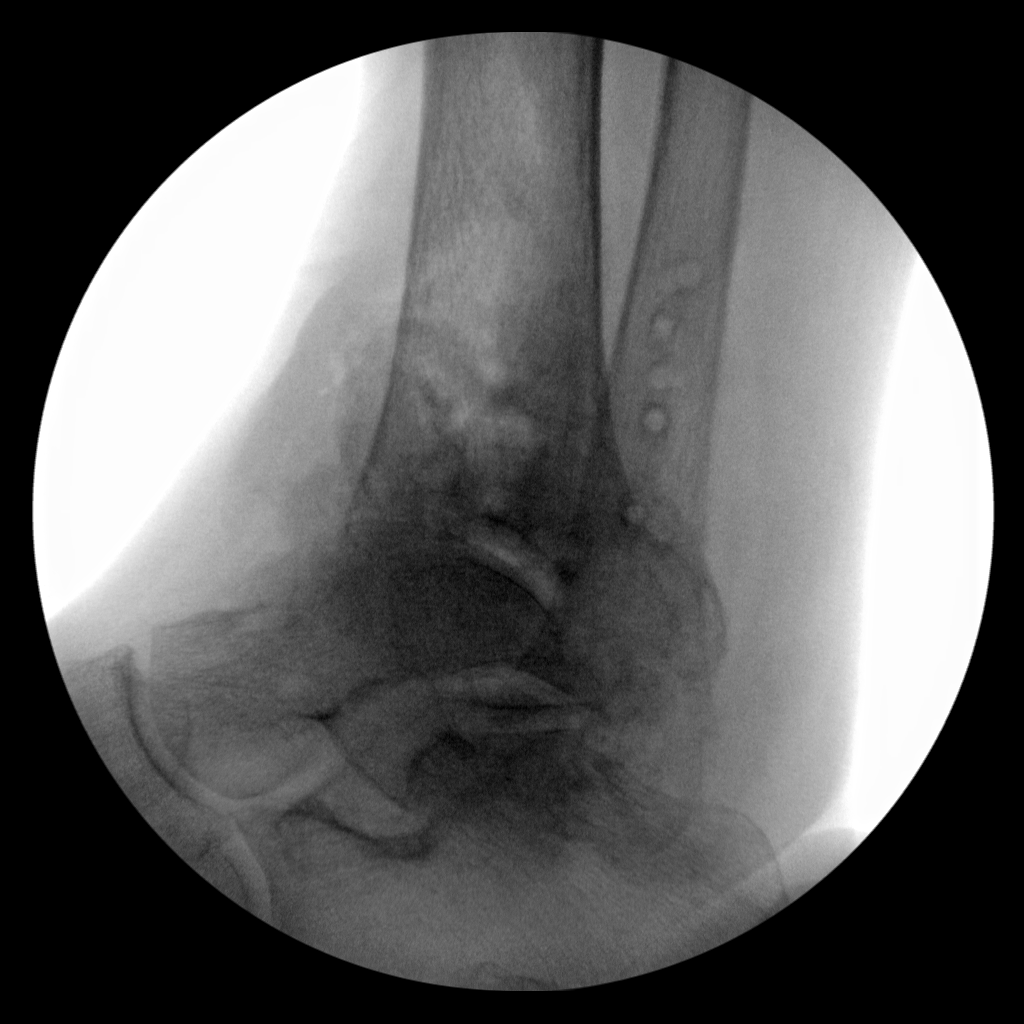
[im 2/5]
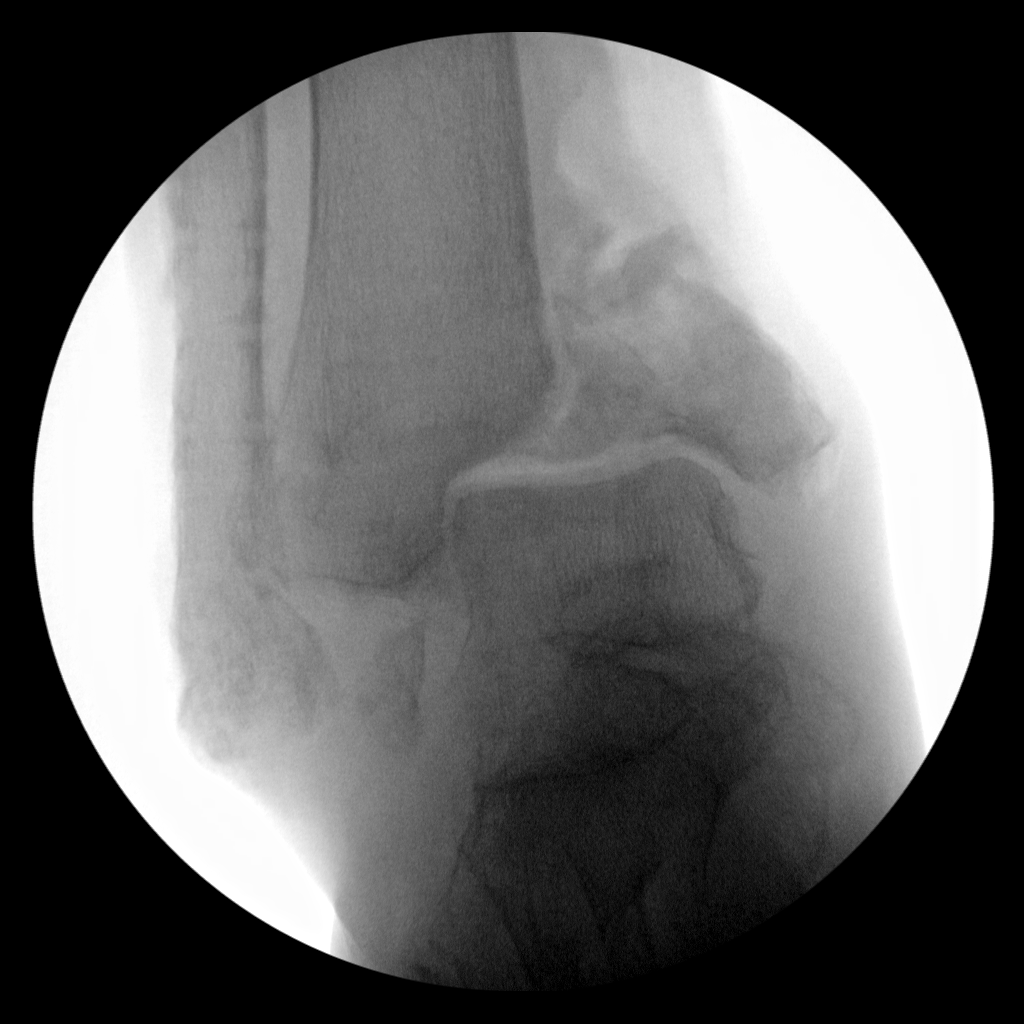
[im 3/5]
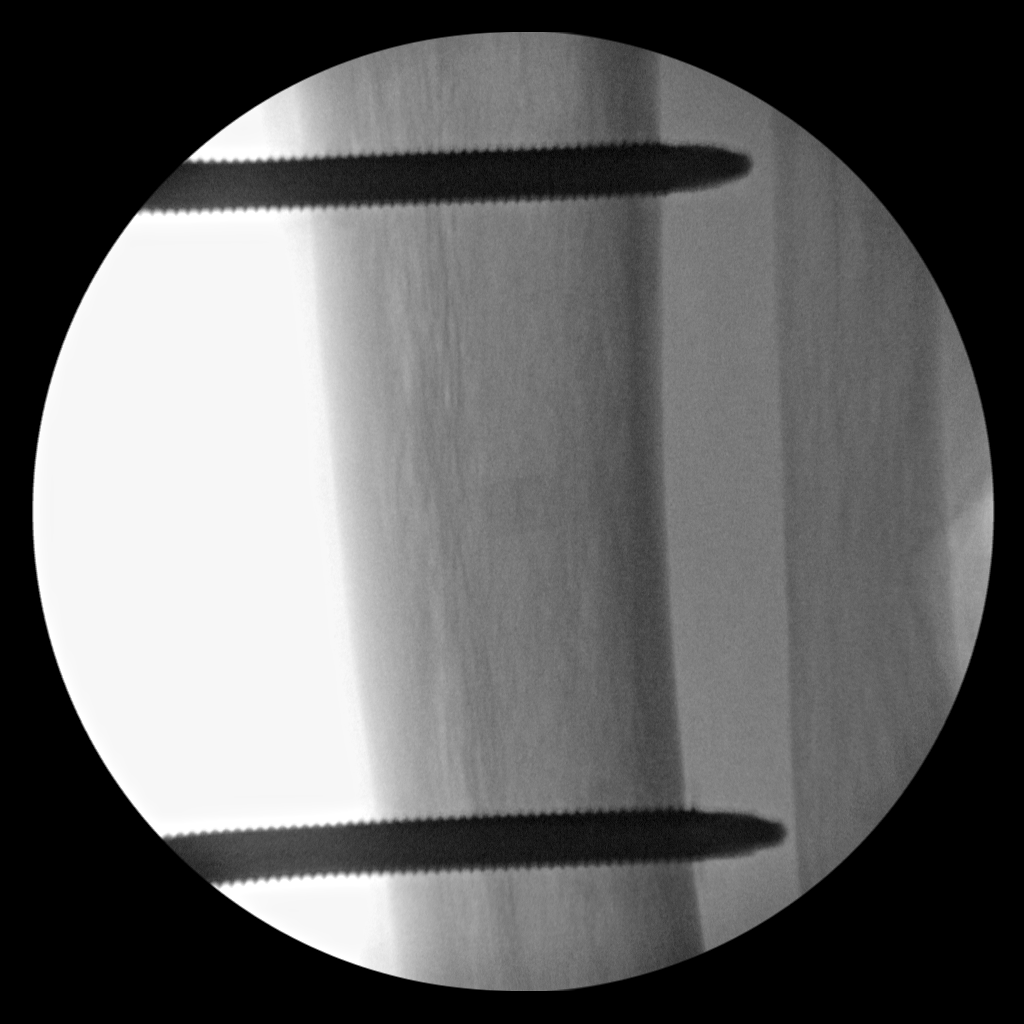
[im 4/5]
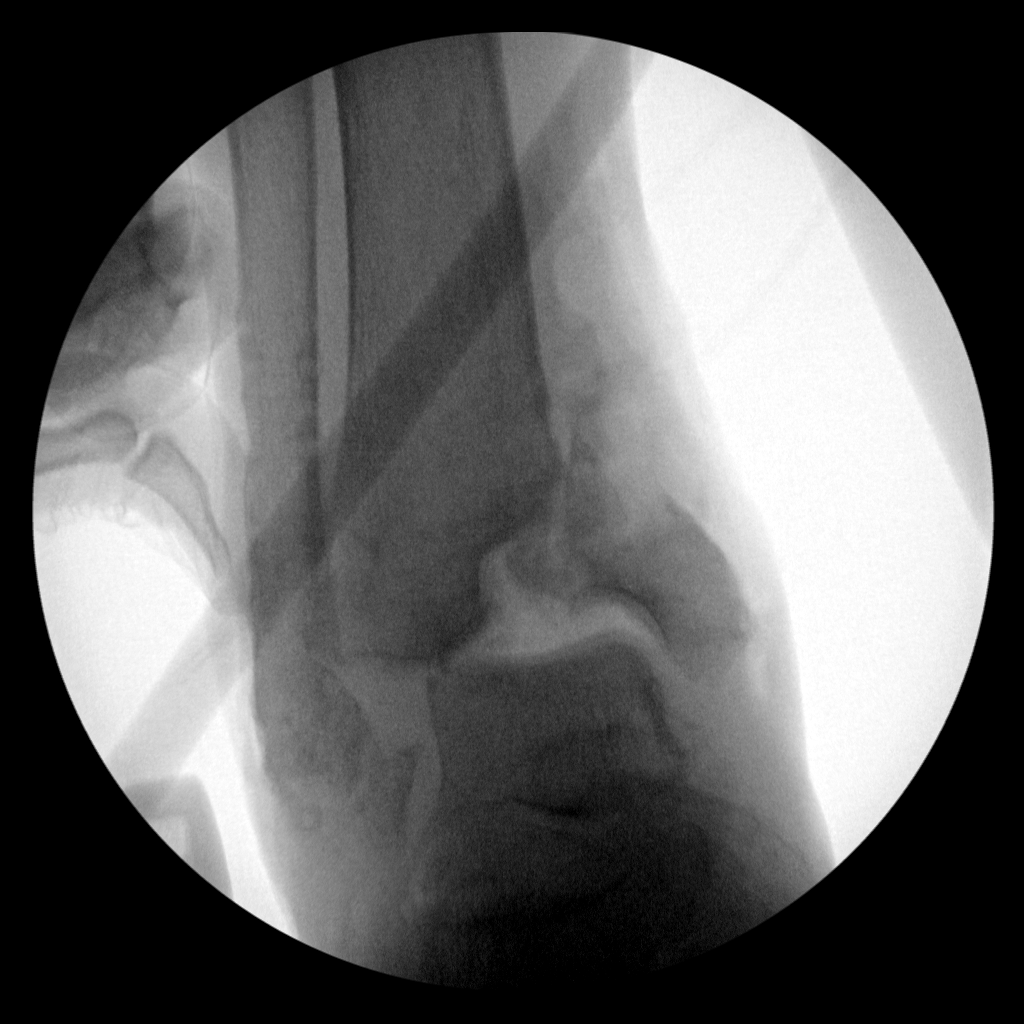
[im 5/5]
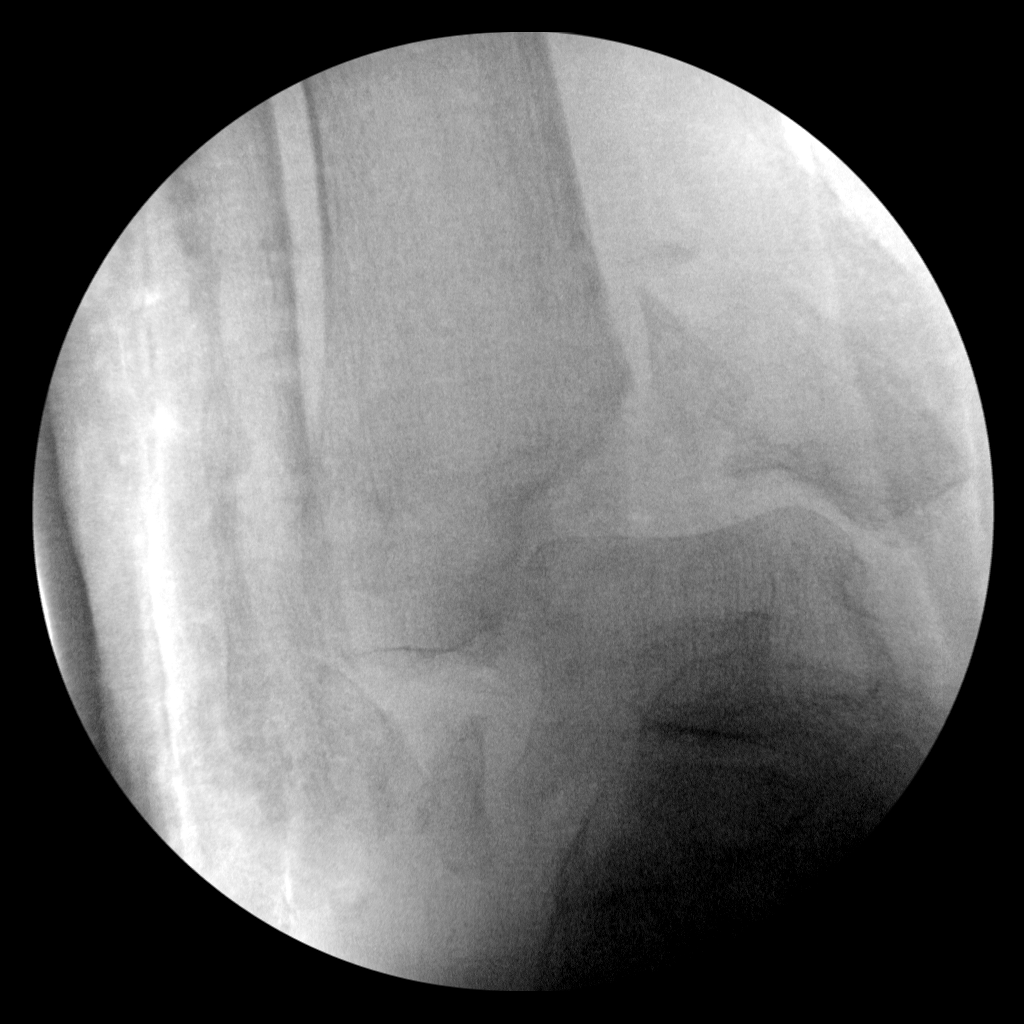

[5 of 5 positions shown; findings below may reference images not displayed]

FINDINGS: Five C-arm fluoroscopic images were obtained intraoperatively and
submitted for post operative interpretation. These images
demonstrate removal of the previously noted ankle fixation hardware.
Image labeled #3 demonstrates attempted external fixation of the
tibia with 2 screws. Please see the performing provider's procedural
report for further detail.
IMPRESSION: Intraoperative fluoroscopic images, as detailed above.
